# Patient Record
Sex: Female | Born: 1965 | Race: White | Hispanic: No | Marital: Married | Smoking: Former smoker
Health system: Southern US, Community
[De-identification: ages and names within clinical notes are randomized; demographics above are authoritative.]

## PROBLEM LIST (undated history)

## (undated) DIAGNOSIS — E039 Hypothyroidism, unspecified: Secondary | ICD-10-CM

## (undated) DIAGNOSIS — F329 Major depressive disorder, single episode, unspecified: Secondary | ICD-10-CM

## (undated) DIAGNOSIS — K429 Umbilical hernia without obstruction or gangrene: Secondary | ICD-10-CM

## (undated) DIAGNOSIS — E119 Type 2 diabetes mellitus without complications: Secondary | ICD-10-CM

## (undated) DIAGNOSIS — D219 Benign neoplasm of connective and other soft tissue, unspecified: Secondary | ICD-10-CM

## (undated) DIAGNOSIS — H40053 Ocular hypertension, bilateral: Secondary | ICD-10-CM

## (undated) DIAGNOSIS — E282 Polycystic ovarian syndrome: Secondary | ICD-10-CM

## (undated) DIAGNOSIS — H40059 Ocular hypertension, unspecified eye: Secondary | ICD-10-CM

## (undated) DIAGNOSIS — M1990 Unspecified osteoarthritis, unspecified site: Secondary | ICD-10-CM

## (undated) DIAGNOSIS — R32 Unspecified urinary incontinence: Secondary | ICD-10-CM

## (undated) DIAGNOSIS — J45909 Unspecified asthma, uncomplicated: Secondary | ICD-10-CM

## (undated) DIAGNOSIS — F32A Depression, unspecified: Secondary | ICD-10-CM

## (undated) DIAGNOSIS — G629 Polyneuropathy, unspecified: Secondary | ICD-10-CM

## (undated) DIAGNOSIS — G709 Myoneural disorder, unspecified: Secondary | ICD-10-CM

## (undated) DIAGNOSIS — N879 Dysplasia of cervix uteri, unspecified: Secondary | ICD-10-CM

## (undated) DIAGNOSIS — R011 Cardiac murmur, unspecified: Secondary | ICD-10-CM

## (undated) DIAGNOSIS — E785 Hyperlipidemia, unspecified: Secondary | ICD-10-CM

## (undated) DIAGNOSIS — N979 Female infertility, unspecified: Secondary | ICD-10-CM

## (undated) DIAGNOSIS — I10 Essential (primary) hypertension: Secondary | ICD-10-CM

## (undated) DIAGNOSIS — E049 Nontoxic goiter, unspecified: Secondary | ICD-10-CM

## (undated) DIAGNOSIS — J302 Other seasonal allergic rhinitis: Secondary | ICD-10-CM

## (undated) DIAGNOSIS — C720 Malignant neoplasm of spinal cord: Secondary | ICD-10-CM

## (undated) HISTORY — DX: Nontoxic goiter, unspecified: E04.9

## (undated) HISTORY — DX: Ocular hypertension, bilateral: H40.053

## (undated) HISTORY — DX: Type 2 diabetes mellitus without complications: E11.9

## (undated) HISTORY — PX: COLPOSCOPY: SHX161

## (undated) HISTORY — DX: Ocular hypertension, unspecified eye: H40.059

## (undated) HISTORY — DX: Hypothyroidism, unspecified: E03.9

## (undated) HISTORY — DX: Umbilical hernia without obstruction or gangrene: K42.9

## (undated) HISTORY — DX: Unspecified asthma, uncomplicated: J45.909

## (undated) HISTORY — DX: Major depressive disorder, single episode, unspecified: F32.9

## (undated) HISTORY — DX: Unspecified urinary incontinence: R32

## (undated) HISTORY — DX: Unspecified osteoarthritis, unspecified site: M19.90

## (undated) HISTORY — DX: Cardiac murmur, unspecified: R01.1

## (undated) HISTORY — DX: Polycystic ovarian syndrome: E28.2

## (undated) HISTORY — DX: Polyneuropathy, unspecified: G62.9

## (undated) HISTORY — PX: BREAST SURGERY: SHX581

## (undated) HISTORY — PX: BACK SURGERY: SHX140

## (undated) HISTORY — DX: Depression, unspecified: F32.A

## (undated) HISTORY — DX: Dysplasia of cervix uteri, unspecified: N87.9

## (undated) HISTORY — DX: Hyperlipidemia, unspecified: E78.5

## (undated) HISTORY — DX: Malignant neoplasm of spinal cord: C72.0

## (undated) HISTORY — DX: Benign neoplasm of connective and other soft tissue, unspecified: D21.9

## (undated) HISTORY — PX: WISDOM TOOTH EXTRACTION: SHX21

---

## 1997-06-11 DIAGNOSIS — C720 Malignant neoplasm of spinal cord: Secondary | ICD-10-CM

## 1997-06-11 HISTORY — DX: Malignant neoplasm of spinal cord: C72.0

## 1997-12-16 ENCOUNTER — Ambulatory Visit (HOSPITAL_COMMUNITY): Admission: RE | Admit: 1997-12-16 | Discharge: 1997-12-16 | Payer: Self-pay | Admitting: *Deleted

## 1997-12-27 ENCOUNTER — Inpatient Hospital Stay (HOSPITAL_COMMUNITY): Admission: RE | Admit: 1997-12-27 | Discharge: 1998-01-04 | Payer: Self-pay | Admitting: *Deleted

## 1998-01-12 ENCOUNTER — Inpatient Hospital Stay (HOSPITAL_COMMUNITY): Admission: AD | Admit: 1998-01-12 | Discharge: 1998-02-07 | Payer: Self-pay | Admitting: Neurosurgery

## 1998-02-25 ENCOUNTER — Encounter: Admission: RE | Admit: 1998-02-25 | Discharge: 1998-02-25 | Payer: Self-pay | Admitting: Internal Medicine

## 1998-08-21 ENCOUNTER — Ambulatory Visit (HOSPITAL_COMMUNITY): Admission: RE | Admit: 1998-08-21 | Discharge: 1998-08-21 | Payer: Self-pay | Admitting: *Deleted

## 1998-09-09 ENCOUNTER — Encounter: Admission: RE | Admit: 1998-09-09 | Discharge: 1998-12-08 | Payer: Self-pay | Admitting: Radiation Oncology

## 1998-09-10 HISTORY — PX: OVARY SURGERY: SHX727

## 1998-09-20 ENCOUNTER — Ambulatory Visit: Admission: RE | Admit: 1998-09-20 | Discharge: 1998-09-20 | Payer: Self-pay | Admitting: Gynecology

## 1998-09-27 ENCOUNTER — Encounter: Payer: Self-pay | Admitting: Gynecology

## 1998-09-27 ENCOUNTER — Inpatient Hospital Stay (HOSPITAL_COMMUNITY): Admission: RE | Admit: 1998-09-27 | Discharge: 1998-09-29 | Payer: Self-pay | Admitting: Gynecology

## 1998-10-11 ENCOUNTER — Ambulatory Visit: Admission: RE | Admit: 1998-10-11 | Discharge: 1998-10-11 | Payer: Self-pay | Admitting: Gynecologic Oncology

## 1998-10-17 ENCOUNTER — Encounter: Payer: Self-pay | Admitting: Radiation Oncology

## 1998-11-09 ENCOUNTER — Ambulatory Visit: Admission: RE | Admit: 1998-11-09 | Discharge: 1998-11-09 | Payer: Self-pay | Admitting: Gynecology

## 1999-04-12 ENCOUNTER — Other Ambulatory Visit: Admission: RE | Admit: 1999-04-12 | Discharge: 1999-04-12 | Payer: Self-pay | Admitting: Obstetrics and Gynecology

## 1999-05-29 ENCOUNTER — Encounter: Payer: Self-pay | Admitting: Obstetrics and Gynecology

## 1999-05-29 ENCOUNTER — Ambulatory Visit (HOSPITAL_COMMUNITY): Admission: RE | Admit: 1999-05-29 | Discharge: 1999-05-29 | Payer: Self-pay | Admitting: Obstetrics and Gynecology

## 1999-06-17 ENCOUNTER — Ambulatory Visit (HOSPITAL_COMMUNITY): Admission: RE | Admit: 1999-06-17 | Discharge: 1999-06-17 | Payer: Self-pay | Admitting: *Deleted

## 2000-02-09 ENCOUNTER — Ambulatory Visit (HOSPITAL_COMMUNITY): Admission: RE | Admit: 2000-02-09 | Discharge: 2000-02-09 | Payer: Self-pay | Admitting: Pediatrics

## 2000-02-09 ENCOUNTER — Encounter: Payer: Self-pay | Admitting: Obstetrics and Gynecology

## 2000-05-22 ENCOUNTER — Other Ambulatory Visit: Admission: RE | Admit: 2000-05-22 | Discharge: 2000-05-22 | Payer: Self-pay | Admitting: Obstetrics and Gynecology

## 2000-07-20 ENCOUNTER — Ambulatory Visit (HOSPITAL_COMMUNITY): Admission: RE | Admit: 2000-07-20 | Discharge: 2000-07-20 | Payer: Self-pay | Admitting: Neurosurgery

## 2000-07-20 ENCOUNTER — Encounter: Payer: Self-pay | Admitting: Neurosurgery

## 2001-04-24 ENCOUNTER — Other Ambulatory Visit: Admission: RE | Admit: 2001-04-24 | Discharge: 2001-04-24 | Payer: Self-pay | Admitting: Obstetrics and Gynecology

## 2002-05-13 ENCOUNTER — Other Ambulatory Visit: Admission: RE | Admit: 2002-05-13 | Discharge: 2002-05-13 | Payer: Self-pay | Admitting: Obstetrics and Gynecology

## 2002-06-11 HISTORY — PX: MYOMECTOMY: SHX85

## 2002-06-11 HISTORY — PX: HYSTEROSCOPY: SHX211

## 2003-05-27 ENCOUNTER — Encounter (INDEPENDENT_AMBULATORY_CARE_PROVIDER_SITE_OTHER): Payer: Self-pay | Admitting: *Deleted

## 2003-05-27 ENCOUNTER — Inpatient Hospital Stay (HOSPITAL_COMMUNITY): Admission: RE | Admit: 2003-05-27 | Discharge: 2003-05-29 | Payer: Self-pay | Admitting: Obstetrics and Gynecology

## 2003-06-10 ENCOUNTER — Other Ambulatory Visit: Admission: RE | Admit: 2003-06-10 | Discharge: 2003-06-10 | Payer: Self-pay | Admitting: Obstetrics and Gynecology

## 2003-11-23 ENCOUNTER — Ambulatory Visit (HOSPITAL_COMMUNITY): Admission: RE | Admit: 2003-11-23 | Discharge: 2003-11-23 | Payer: Self-pay | Admitting: Obstetrics and Gynecology

## 2004-08-07 ENCOUNTER — Other Ambulatory Visit: Admission: RE | Admit: 2004-08-07 | Discharge: 2004-08-07 | Payer: Self-pay | Admitting: Obstetrics and Gynecology

## 2004-12-04 ENCOUNTER — Ambulatory Visit (HOSPITAL_COMMUNITY): Admission: RE | Admit: 2004-12-04 | Discharge: 2004-12-04 | Payer: Self-pay | Admitting: Obstetrics and Gynecology

## 2004-12-14 ENCOUNTER — Encounter: Admission: RE | Admit: 2004-12-14 | Discharge: 2004-12-14 | Payer: Self-pay | Admitting: Obstetrics and Gynecology

## 2004-12-14 ENCOUNTER — Encounter (INDEPENDENT_AMBULATORY_CARE_PROVIDER_SITE_OTHER): Payer: Self-pay | Admitting: *Deleted

## 2004-12-14 HISTORY — PX: BREAST BIOPSY: SHX20

## 2005-03-20 ENCOUNTER — Ambulatory Visit: Payer: Self-pay | Admitting: Internal Medicine

## 2005-08-20 ENCOUNTER — Other Ambulatory Visit: Admission: RE | Admit: 2005-08-20 | Discharge: 2005-08-20 | Payer: Self-pay | Admitting: Obstetrics and Gynecology

## 2005-09-18 ENCOUNTER — Encounter: Admission: RE | Admit: 2005-09-18 | Discharge: 2005-09-18 | Payer: Self-pay | Admitting: Obstetrics and Gynecology

## 2005-10-15 ENCOUNTER — Ambulatory Visit: Payer: Self-pay | Admitting: Internal Medicine

## 2005-12-05 ENCOUNTER — Ambulatory Visit (HOSPITAL_COMMUNITY): Admission: RE | Admit: 2005-12-05 | Discharge: 2005-12-05 | Payer: Self-pay | Admitting: Obstetrics and Gynecology

## 2006-04-29 ENCOUNTER — Ambulatory Visit: Admission: RE | Admit: 2006-04-29 | Discharge: 2006-04-29 | Payer: Self-pay | Admitting: Obstetrics and Gynecology

## 2006-04-29 ENCOUNTER — Encounter: Payer: Self-pay | Admitting: Vascular Surgery

## 2006-08-22 ENCOUNTER — Other Ambulatory Visit: Admission: RE | Admit: 2006-08-22 | Discharge: 2006-08-22 | Payer: Self-pay | Admitting: Obstetrics and Gynecology

## 2006-12-24 ENCOUNTER — Ambulatory Visit (HOSPITAL_COMMUNITY): Admission: RE | Admit: 2006-12-24 | Discharge: 2006-12-24 | Payer: Self-pay | Admitting: Obstetrics and Gynecology

## 2007-04-02 ENCOUNTER — Ambulatory Visit (HOSPITAL_COMMUNITY): Admission: RE | Admit: 2007-04-02 | Discharge: 2007-04-02 | Payer: Self-pay | Admitting: Gynecology

## 2007-05-03 ENCOUNTER — Telehealth: Payer: Self-pay | Admitting: Internal Medicine

## 2007-09-04 ENCOUNTER — Other Ambulatory Visit: Admission: RE | Admit: 2007-09-04 | Discharge: 2007-09-04 | Payer: Self-pay | Admitting: Obstetrics and Gynecology

## 2008-02-27 ENCOUNTER — Ambulatory Visit (HOSPITAL_COMMUNITY): Admission: RE | Admit: 2008-02-27 | Discharge: 2008-02-27 | Payer: Self-pay | Admitting: Obstetrics and Gynecology

## 2008-06-11 HISTORY — PX: DILATION AND CURETTAGE OF UTERUS: SHX78

## 2008-06-16 ENCOUNTER — Ambulatory Visit: Payer: Self-pay | Admitting: Obstetrics and Gynecology

## 2008-06-17 ENCOUNTER — Ambulatory Visit: Payer: Self-pay | Admitting: Obstetrics and Gynecology

## 2008-06-30 ENCOUNTER — Ambulatory Visit: Payer: Self-pay | Admitting: Obstetrics and Gynecology

## 2008-07-12 ENCOUNTER — Ambulatory Visit: Payer: Self-pay | Admitting: Obstetrics and Gynecology

## 2008-07-16 ENCOUNTER — Encounter: Payer: Self-pay | Admitting: Obstetrics and Gynecology

## 2008-07-16 ENCOUNTER — Ambulatory Visit (HOSPITAL_BASED_OUTPATIENT_CLINIC_OR_DEPARTMENT_OTHER): Admission: RE | Admit: 2008-07-16 | Discharge: 2008-07-16 | Payer: Self-pay | Admitting: Obstetrics and Gynecology

## 2008-07-16 ENCOUNTER — Ambulatory Visit: Payer: Self-pay | Admitting: Obstetrics and Gynecology

## 2008-07-22 ENCOUNTER — Ambulatory Visit: Payer: Self-pay | Admitting: Obstetrics and Gynecology

## 2008-09-07 ENCOUNTER — Other Ambulatory Visit: Admission: RE | Admit: 2008-09-07 | Discharge: 2008-09-07 | Payer: Self-pay | Admitting: Obstetrics and Gynecology

## 2008-09-07 ENCOUNTER — Ambulatory Visit: Payer: Self-pay | Admitting: Obstetrics and Gynecology

## 2008-09-07 ENCOUNTER — Encounter: Payer: Self-pay | Admitting: Obstetrics and Gynecology

## 2008-09-16 ENCOUNTER — Ambulatory Visit: Payer: Self-pay | Admitting: Obstetrics and Gynecology

## 2009-01-27 ENCOUNTER — Ambulatory Visit: Payer: Self-pay | Admitting: Obstetrics and Gynecology

## 2009-02-28 ENCOUNTER — Ambulatory Visit (HOSPITAL_COMMUNITY): Admission: RE | Admit: 2009-02-28 | Discharge: 2009-02-28 | Payer: Self-pay | Admitting: Obstetrics and Gynecology

## 2009-05-20 ENCOUNTER — Ambulatory Visit: Payer: Self-pay | Admitting: Obstetrics and Gynecology

## 2009-09-28 ENCOUNTER — Ambulatory Visit: Payer: Self-pay | Admitting: Obstetrics and Gynecology

## 2009-09-28 ENCOUNTER — Other Ambulatory Visit: Admission: RE | Admit: 2009-09-28 | Discharge: 2009-09-28 | Payer: Self-pay | Admitting: Obstetrics and Gynecology

## 2010-02-24 ENCOUNTER — Ambulatory Visit: Payer: Self-pay | Admitting: Obstetrics and Gynecology

## 2010-03-03 ENCOUNTER — Ambulatory Visit (HOSPITAL_COMMUNITY): Admission: RE | Admit: 2010-03-03 | Discharge: 2010-03-03 | Payer: Self-pay | Admitting: Obstetrics and Gynecology

## 2010-10-24 NOTE — Op Note (Signed)
NAME:  Erika [name], Erika [name]                 ACCOUNT NO.:  [id number]   MEDICAL RECORD NO.:  [id number]          PATIENT TYPE:  AMB   LOCATION:  [facility]                         FACILITY:  [facility]   PHYSICIAN:  Daniel L. Gottsegen, M.D.DATE OF BIRTH:  [birth year]   DATE OF PROCEDURE:  07/16/2008  DATE OF DISCHARGE:                               OPERATIVE REPORT   PREOPERATIVE DIAGNOSIS:  Endometrial polyp.   POSTOPERATIVE DIAGNOSIS:  Endometrial polyp.   NAME OF OPERATION:  Hysteroscopy, D and C, excision of endometrial  polyps.   SURGEON:  Daniel L. Eda Paschal, M.D.   ANESTHESIA:  General.   INDICATIONS:  The patient is a 45 year old nulligravida who had  presented to the office with pelvic pain and underwent an ultrasound.  Ultrasound revealed a large endometrial polyp of 1-2 cm coming off the  anterior fundal wall.  She now enters the hospital now for excision of  the above.   FINDINGS:  External is normal.  BUS is normal.  Vaginal is normal.  Cervix is clean.  Uterus is normal size and shape.  Adnexa failed to  reveal masses.  At the time of hysteroscopy, the patient had a very  large endometrial polyp of 1-2 cm coming off the anterior wall of the  fundus, near the top of the fundus.  Endometrial curettings were lush.  Other than the above, there were no other abnormal findings on  hysteroscopy, both in the uterus or the cervix.   PROCEDURE:  After adequate general anesthesia, the patient was placed in  the dorsal lithotomy position, prepped and draped in a sterile manner.  A single-tooth tenaculum was placed in the anterior lip of the cervix.  The cervix was dilated to a #31 Pratt dilator.  A hysteroscopic  resectoscope was utilized using a camera for magnification and 3%  sorbitol to expand the intrauterine cavity.  A 90-degree wire loop was  utilized with appropriate Bovie settings.  The hysteroscopic  resectoscope was introduced.  The polyp was located and was removed  using as  little cautery as possible to prevent any adhesion formation  postoperatively.  Once it was removed, the endometrium was so lush that  it was possible she had other findings that we could not see, so she was  gently curetted in order to be able to see better.  At this point, we  could see the endometrium better.  There were no other abnormal  findings.  The procedure was terminated.  There was no bleeding noted.  The pathology that was sent was endometrial curettings plus polyp.  Fluid deficit was 90 mL.  Blood loss was minimal.  The patient tolerated  the procedure well and left the operating room in satisfactory  condition.      Daniel L. Eda Paschal, M.D.  Electronically Signed     DLG/MEDQ  D:  07/16/2008  T:  07/16/2008  Job:  [id number]

## 2010-10-27 ENCOUNTER — Other Ambulatory Visit: Payer: Self-pay

## 2010-10-27 NOTE — Op Note (Signed)
NAME:  Erika [name], Erika [name]                 ACCOUNT NO.:  [id number]   MEDICAL RECORD NO.:  [id number]                   PATIENT TYPE:  INP   LOCATION:  0002                                 FACILITY:  [facility]   PHYSICIAN:  Daniel L. Eda Paschal, M.D.           DATE OF BIRTH:  1966/04/06   DATE OF PROCEDURE:  05/27/2003  DATE OF DISCHARGE:                                 OPERATIVE REPORT   PREOPERATIVE DIAGNOSES:  1. Menorrhagia with two submucous myomas.  2. Pelvic adhesions.   POSTOPERATIVE DIAGNOSES:  1. Menorrhagia with two submucous myomas.  2. Pelvic adhesions.   OPERATION:  1. Exploratory laparotomy with lysis of pelvic adhesions.  2. Multiple myomectomies.   SURGEON:  Daniel L. Eda Paschal, M.D.   FIRST ASSISTANT:  Juan H. Lily Peer, M.D.   ANESTHESIA:  General.   FINDINGS:  At the time of surgery, the patient had a large 3 cm myoma that  was coming off the anterior fundal wall and went all the way to the  endometrial cavity but did not actually enter the cavity.  It obviously was  in a position in the upper fundal portion.  It was submucosal that it could  have caused her menorrhagia.  In addition, she had a second smaller myoma of  about 1.5 cm that came off the right posterior myometrial wall and was not  quite as deep as the other one.  The patient's right ovary and tube had been  separated from the uterus by Dr. De Blanch previously  preradiation.  They could be identified up at the ileocecal junction.  There  were some adhesions around it, but they appeared to be well-situated, and  therefore there was no reason to move them.  The patient's left fallopian  tube was normal with luxuriant fimbria.  Left ovary was adherent to the back  of the uterus where it had been repositioned prior to radiation.  There was  no evidence of endometriosis.   DESCRIPTION OF PROCEDURE:  After adequate general endotracheal anesthesia,  the patient was placed in the supine  position and prepped and draped in the  usual sterile manner.  A Foley catheter was inserted into the patient's  bladder, and a pediatric Foley was placed in the uterus.  The patient is  LATEX sensitive, so the room was set up so that no latex equipment, gloves,  etc. were used.  Once this had been done, the patient's previous midline  vertical incision was excised because it had spread.  The incision was then  taken down to the fascia which was opened vertically as was the peritoneum.  Subcutaneous bleeders were clamped and Bovied as encountered.  When the  peritoneal cavity was opened, the above findings were noted.  The adhesion  of the ovary to the uterus was lysed on the left so that now she had a nice,  normal free left ovary and tube with a normal fallopian tube with luxuriant  fimbria.  The  uterus was elevated.  A dilute solution of Pitressin was  injected on the anterior surface of the fundus.  A vertical incision was  made into the fundus, taking care to avoid the left fallopian tube.  The  myoma was immediately encountered.  It was cut down all the way until the  myoma could be excised.  At this point, you could see the endometrial cavity  bulging, and it appeared to be only itself thick.  That is how close to the  endometrium that this myoma was.  Dye was introduced and this area  ballooned.  The left fallopian tube was also patent.  The defect in the  myometrium was closed with interrupted 0 Vicryl, and then the serosa was  closed with a running 2-0 Prolene.  Attention was next addressed to the  other myoma on the right side.  The area was also injected with Pitressin to  prevent bleeding, and then the myoma was removed without difficulty.  The  myometrial defect was closed with interrupted 0 Vicryl, and the serosa was  closed with a 0 Prolene.  Careful inspection was made.  There was no  bleeding noted in the entire pelvis.  She now had a mobile and free left  ovary and tube.   It was felt there was no reason to remove the right ovary.  Intercede was placed where the ovary had been adherent and also over the  anterior myomectomy incision.  Two sponge, needle, and instrument counts  were correct.  The peritoneum and fascia were closed with figure-of-eights  of 0 Vicryl.  Subcutaneous tissue was reapproximated with 3-0 plain, and  then a 4-0 Vicryl subcuticular suture was placed along with Steri-Strips to  close the skin.  Estimated blood loss for the entire procedure was 100 mL  with none replaced.  The patient tolerated the procedure well and left the  operating room in satisfactory condition, draining clear urine from her  Foley catheter.                                               Daniel L. Eda Paschal, M.D.    Tonette Bihari  D:  05/27/2003  T:  05/27/2003  Job:  [id number]

## 2010-10-27 NOTE — H&P (Signed)
NAME:  Erika [name], Erika [name]                 ACCOUNT NO.:  [id number]   MEDICAL RECORD NO.:  [id number]                   PATIENT TYPE:  INP   LOCATION:  NA                                   FACILITY:  [facility]   PHYSICIAN:  Daniel L. Eda Paschal, M.D.           DATE OF BIRTH:  [birth year]   DATE OF ADMISSION:  DATE OF DISCHARGE:                                HISTORY & PHYSICAL   DATE OF ADMISSION:  May 27, 2003   CHIEF COMPLAINT:  Menometrorrhagia with submucous myomas.   HISTORY OF PRESENT ILLNESS:  The patient is a 45 year old nulligravida who  started to have severe menometrorrhagia this summer.  Thyroid was checked  which was normal.  The patient was nonresponsive to oral progestins.  Saline  infusion sonohysterogram was done which showed several small submucous  myomas of about 2 cm each which were distorting the cavity but were clearly  not intracavitary.  It is felt that these are the sources of her  menometrorrhagia.  The patient now enters the hospital for myomectomy  because she is hopeful to conceive.  She understands there is some risk of  hysterectomy but obviously is doing poorly with her menometrorrhagia.  Her  history is also significant in that she had an ependymoma.  She had three  neurosurgical procedures to excise it and then ended up having radiation.  Prior to radiation she saw Dr. De Blanch who operated on her and  did bilateral ovarian transpositions on her to preserve ovarian function  with radiation.  She has had a hysterosalpingogram done which showed that  she does have a patent left fallopian tube and nonpatent right tube.  She  may have peritubular and periovarian adhesions on the left and this will be  treated as well as this time.   PAST MEDICAL HISTORY:  1. 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  2. Exploratory laparotomy by Dr. De Blanch with bilateral     ovarian transposition.   PRESENT  MEDICATIONS:  1. Glucophage for polycystic ovarian syndrome.  2. Chromagen for blood loss.   ALLERGIES:  LATEX and SULFA.   FAMILY HISTORY:  Positive for breast cancer, uncle has had lung cancer, and  mother has had hypertension.   SOCIAL HISTORY:  She does not smoke currently but she did smoke from age 5  to 63.  She does not use alcohol.   REVIEW OF SYSTEMS:  HEENT:  Negative.  CARDIAC:  Negative.  RESPIRATORY:  Negative.  GI:  Negative.  GU:  Negative.  NEUROMUSCULAR:  Negative.  ENDOCRINE:  Negative.   PHYSICAL EXAMINATION:  GENERAL:  The patient is a well-developed and well-  nourished female in no acute distress.  VITAL SIGNS:  Blood pressure is 130/70, pulse is 80 and regular,  respirations 16 and nonlabored; she is afebrile.  HEENT:  All within normal limits.  NECK:  Supple. Trachea in the midline.  Thyroid is not enlarged.  LUNGS:  Clear to P&A.  HEART:  No thrills, heaves, or murmurs.  BREASTS:  No masses.  ABDOMEN:  Soft without guarding, rebound, or masses.  PELVIC:  External vaginal is normal.  Cervix is clean.  Pap smear showed no  atypia.  Uterus is slightly enlarged.  Adnexa are palpably normal.  RECTAL:  Negative.   ADMISSION IMPRESSION:  Menometrorrhagia with several submucous myomas.   PLAN:  Exploratory laparotomy with myomectomy.                                               Daniel L. Eda Paschal, M.D.    Erika [name]  D:  05/26/2003  T:  05/26/2003  Job:  [id number]

## 2010-10-27 NOTE — Discharge Summary (Signed)
NAME:  Erika [name], Erika [name]                 ACCOUNT NO.:  [id number]   MEDICAL RECORD NO.:  [id number]                   PATIENT TYPE:  INP   LOCATION:  0443                                 FACILITY:  [facility]   PHYSICIAN:  Daniel L. Eda Paschal, M.D.           DATE OF BIRTH:  1965/09/26   DATE OF ADMISSION:  05/27/2003  DATE OF DISCHARGE:  05/29/2003                                 DISCHARGE SUMMARY   HISTORY OF PRESENT ILLNESS:  The patient is a 45 year old female who  presented to the office with menometrorrhagia with several submucous myomas.   HOSPITAL COURSE:  On the day of admission, she was taken to the operating  room and multiple myomectomies were done.  She also had lysis of pelvic  adhesions.  By the second postoperative day she was voiding well, passing  gas, and was ready for discharge.  She was discharged on Tylox for pain  relief.  She was also given two Diflucan 150 mg tablets to take one today  and one in 72 hours for a yeast infection with vaginal itching.  She will  see me in the office in two weeks.  Final pathology report is not available  at time of dictation.   CONDITION ON DISCHARGE:  Improved.   DISCHARGE DIAGNOSES:  1. Multiple myomas with menorrhagia.  2. Pelvic adhesions.   OPERATION:  Exploratory laparotomy with multiple myomectomies and lysis of  pelvic adhesions.                                               Daniel L. Eda Paschal, M.D.    Tonette Bihari  D:  05/29/2003  T:  05/29/2003  Job:  [id number]

## 2010-11-07 ENCOUNTER — Other Ambulatory Visit (INDEPENDENT_AMBULATORY_CARE_PROVIDER_SITE_OTHER): Payer: BC Managed Care – PPO

## 2010-11-07 DIAGNOSIS — N912 Amenorrhea, unspecified: Secondary | ICD-10-CM

## 2010-11-07 DIAGNOSIS — N926 Irregular menstruation, unspecified: Secondary | ICD-10-CM

## 2010-11-07 DIAGNOSIS — Z13220 Encounter for screening for lipoid disorders: Secondary | ICD-10-CM

## 2010-11-09 ENCOUNTER — Other Ambulatory Visit (HOSPITAL_COMMUNITY)
Admission: RE | Admit: 2010-11-09 | Discharge: 2010-11-09 | Disposition: A | Discharge status: Home / Self Care | Payer: BC Managed Care – PPO | Source: Ambulatory Visit | Attending: Obstetrics and Gynecology | Admitting: Obstetrics and Gynecology

## 2010-11-09 ENCOUNTER — Encounter (INDEPENDENT_AMBULATORY_CARE_PROVIDER_SITE_OTHER): Payer: BC Managed Care – PPO | Admitting: Obstetrics and Gynecology

## 2010-11-09 ENCOUNTER — Other Ambulatory Visit: Payer: Self-pay | Admitting: Obstetrics and Gynecology

## 2010-11-09 DIAGNOSIS — Z01419 Encounter for gynecological examination (general) (routine) without abnormal findings: Secondary | ICD-10-CM

## 2010-11-09 DIAGNOSIS — Z124 Encounter for screening for malignant neoplasm of cervix: Secondary | ICD-10-CM | POA: Insufficient documentation

## 2010-11-09 DIAGNOSIS — R82998 Other abnormal findings in urine: Secondary | ICD-10-CM

## 2010-11-29 ENCOUNTER — Other Ambulatory Visit (INDEPENDENT_AMBULATORY_CARE_PROVIDER_SITE_OTHER): Payer: BC Managed Care – PPO

## 2010-11-29 DIAGNOSIS — N912 Amenorrhea, unspecified: Secondary | ICD-10-CM

## 2010-12-06 ENCOUNTER — Ambulatory Visit (INDEPENDENT_AMBULATORY_CARE_PROVIDER_SITE_OTHER): Payer: BC Managed Care – PPO | Admitting: Obstetrics and Gynecology

## 2010-12-06 ENCOUNTER — Other Ambulatory Visit: Payer: BC Managed Care – PPO

## 2010-12-06 DIAGNOSIS — N912 Amenorrhea, unspecified: Secondary | ICD-10-CM

## 2010-12-06 DIAGNOSIS — D259 Leiomyoma of uterus, unspecified: Secondary | ICD-10-CM

## 2010-12-06 DIAGNOSIS — N83209 Unspecified ovarian cyst, unspecified side: Secondary | ICD-10-CM

## 2011-01-23 ENCOUNTER — Telehealth: Payer: Self-pay

## 2011-01-23 NOTE — Telephone Encounter (Signed)
CALLING WITH UPDATE-SEE YOUR 12-06-10 OV NOTE. HAD A PERIOD THE END OF June 2012 AFTER THE ULTRASOUND HERE. NO PERIOD AT ALL IN July  AND THEN HAD PERIOD 01-17-11 WITHOUT TAKING GENERIC PROVERA. RECEIVED LETTER TO DO 02-2011 ULTRASOUND AND WILL CALL BACK SOON TO SET THIS UP.

## 2011-02-16 ENCOUNTER — Other Ambulatory Visit: Payer: Self-pay | Admitting: Obstetrics and Gynecology

## 2011-02-16 DIAGNOSIS — Z1231 Encounter for screening mammogram for malignant neoplasm of breast: Secondary | ICD-10-CM

## 2011-03-05 ENCOUNTER — Ambulatory Visit (HOSPITAL_COMMUNITY): Payer: BC Managed Care – PPO

## 2011-03-19 ENCOUNTER — Ambulatory Visit (HOSPITAL_COMMUNITY)
Admission: RE | Admit: 2011-03-19 | Discharge: 2011-03-19 | Disposition: A | Discharge status: Home / Self Care | Payer: BC Managed Care – PPO | Source: Ambulatory Visit | Attending: Obstetrics and Gynecology | Admitting: Obstetrics and Gynecology

## 2011-03-19 DIAGNOSIS — Z1231 Encounter for screening mammogram for malignant neoplasm of breast: Secondary | ICD-10-CM | POA: Insufficient documentation

## 2011-05-07 ENCOUNTER — Ambulatory Visit (INDEPENDENT_AMBULATORY_CARE_PROVIDER_SITE_OTHER): Payer: BC Managed Care – PPO

## 2011-05-07 ENCOUNTER — Ambulatory Visit: Payer: BC Managed Care – PPO | Admitting: Obstetrics and Gynecology

## 2011-05-07 ENCOUNTER — Ambulatory Visit: Payer: BC Managed Care – PPO

## 2011-05-07 ENCOUNTER — Other Ambulatory Visit: Payer: Self-pay | Admitting: Obstetrics and Gynecology

## 2011-05-07 ENCOUNTER — Ambulatory Visit (INDEPENDENT_AMBULATORY_CARE_PROVIDER_SITE_OTHER): Payer: BC Managed Care – PPO | Admitting: Obstetrics and Gynecology

## 2011-05-07 DIAGNOSIS — I872 Venous insufficiency (chronic) (peripheral): Secondary | ICD-10-CM

## 2011-05-07 DIAGNOSIS — N7013 Chronic salpingitis and oophoritis: Secondary | ICD-10-CM

## 2011-05-07 DIAGNOSIS — K429 Umbilical hernia without obstruction or gangrene: Secondary | ICD-10-CM

## 2011-05-07 DIAGNOSIS — N7011 Chronic salpingitis: Secondary | ICD-10-CM

## 2011-05-07 DIAGNOSIS — N83209 Unspecified ovarian cyst, unspecified side: Secondary | ICD-10-CM

## 2011-05-07 DIAGNOSIS — N949 Unspecified condition associated with female genital organs and menstrual cycle: Secondary | ICD-10-CM

## 2011-05-07 DIAGNOSIS — N8300 Follicular cyst of ovary, unspecified side: Secondary | ICD-10-CM

## 2011-05-07 NOTE — Progress Notes (Signed)
Patient came to see me today for problem visit. The first problem is that she needed a followup ultrasound due to left ovarian cyst. The cyst was discovered in June of 2012 and was associated with a left hydrosalpinx. In addition she's noticed a bulge in her umbilicus. It's a little bit uncomfortable and she wonders if she doesn't have a hernia. Her third problem is that she continues to get lower extremity pain. It can be associated with discoloration of the skin. She is had to have 2 Doppler studies done to rule out DVT in the past. Both were negative.  Pelvic ultrasound: Patient's uterus is anteverted with small myomas that are unchanged. Her endometrial echo is 10.5 mm and she just started her period today. Her right ovary is normal. On her left ovary the previous cyst is now gone. She has a small 1 cm thick walled cyst with internal echoes that is probably the residual of her corpus luteum. She continues to have a left hydrosalpinx. It is smaller today. Her cul-de-sac is free of fluid.  Abdomen: I believe she has a small umbilical hernia of 1-2 cm. Extremities: Consistent with venous insufficiency and some skin changes of stasis.  Assessment: #1. Resolution of left ovarian cyst #2. Left hydrosalpinx #3. Small umbilical hernia #4. Venous insufficiency  Plan: Reassured about left ovarian cyst. Once again had a discussion about her infertility and her hydrosalpinx. Once again told her the best solution would be doner egg. Suggested she go back and we discussed it with [facility] infertility. Referred her to Dr. Hart Rochester regarding her legs. Referred her to Dr. Daphine Deutscher regarding her umbilical hernia.

## 2011-07-13 ENCOUNTER — Other Ambulatory Visit: Payer: Self-pay | Admitting: Obstetrics and Gynecology

## 2011-07-13 NOTE — Telephone Encounter (Signed)
Dr. Timoteo Expose pt. Chart is on your desk.  Last filled 12-14-10 for #60 with 6 refills.

## 2011-08-09 ENCOUNTER — Ambulatory Visit: Payer: BC Managed Care – PPO

## 2011-08-09 ENCOUNTER — Ambulatory Visit (INDEPENDENT_AMBULATORY_CARE_PROVIDER_SITE_OTHER): Payer: BC Managed Care – PPO | Admitting: Family Medicine

## 2011-08-09 VITALS — BP 142/97 | HR 99 | Temp 98.7°F | Resp 18 | Ht 64.5 in | Wt 225.0 lb

## 2011-08-09 DIAGNOSIS — E282 Polycystic ovarian syndrome: Secondary | ICD-10-CM

## 2011-08-09 DIAGNOSIS — I10 Essential (primary) hypertension: Secondary | ICD-10-CM

## 2011-08-09 DIAGNOSIS — J158 Pneumonia due to other specified bacteria: Secondary | ICD-10-CM

## 2011-08-09 DIAGNOSIS — R05 Cough: Secondary | ICD-10-CM

## 2011-08-09 DIAGNOSIS — R509 Fever, unspecified: Secondary | ICD-10-CM

## 2011-08-09 DIAGNOSIS — J189 Pneumonia, unspecified organism: Secondary | ICD-10-CM

## 2011-08-09 MED ORDER — LEVOFLOXACIN 500 MG PO TABS: 500.0000 mg | ORAL_TABLET | Freq: Every day | ORAL | Status: AC

## 2011-08-09 NOTE — Progress Notes (Signed)
  Patient Name: Erika [name] Date of Birth: 1966-04-04 Medical Record Number: [id number] Gender: female Date of Encounter: 08/09/2011  History of Present Illness:  Erika [name] is a 46 y.o. very pleasant female patient who presents with the following:  One week ago she developed a cough and then a fever up to 103 on Saturday (today is Thursday).  Then [facility] and got tessalon Perles and other OTC medications for presumed flu or viral URI. temperature 99.1 yesterday.  Has not taken any antipyretics today.  She is concerned because she has a history of asthma.  Last night she had a severe cough attack.  She has had some wheezing especially at night.  Throat feels raw, she notes a HA due to cough and her left ear aches a little bit.    Patient Active Problem List  Diagnoses  . Hypertension  . PCOS (polycystic ovarian syndrome)   Past Medical History  Diagnosis Date  . Fibroid    Past Surgical History  Procedure Date  . Ovary surgery 2000    bilateral ovarian transposition  . Dilation and curettage of uterus 2010    endometrial polyp  . Hysteroscopy 2010  . Myomectomy 2004  . Back surgery    History  Substance Use Topics  . Smoking status: Never Smoker   . Smokeless tobacco: Never Used  . Alcohol Use: No   Family History  Problem Relation Age of Onset  . Hypertension Maternal Aunt   . Breast cancer Maternal Aunt    Allergies  Allergen Reactions  . Latex Hives  . Macrobid Rash  . Sulfa Antibiotics Rash    Medication list has been reviewed and updated.  Review of Systems: As per HPI- otherwise negative.  Physical Examination: Filed Vitals:   08/09/11 1557  BP: 142/97  Pulse: 99  Temp: 98.7 F (37.1 C)  TempSrc: Oral  Resp: 18  Height: 5' 4.5" (1.638 m)  Weight: 225 lb (102.059 kg)    Body mass index is 38.02 kg/(m^2).  GEN: WDWN, NAD, Non-toxic, A & O x 3, obese HEENT: Atraumatic, Normocephalic. Neck supple. No masses, No LAD.  TM and oropharynx wnl.   Ears and Nose: No external deformity. CV: RRR, No M/G/R. No JVD. No thrill. No extra heart sounds. PULM: CTA B, no wheezes, crackles, rhonchi. No retractions. No resp. distress. No accessory muscle use. EXTR: No c/c/e Full strength UE NEURO Normal gait.  PSYCH: Normally interactive. Conversant. Not depressed or anxious appearing.  Calm demeanor.   [facility] reading (PRIMARY) by  Dr. Patsy Lager.  RLL/ retrocardiac infiltrate   Assessment and Plan: 1. Cough  DG Chest 2 View  2. Hypertension    3. PCOS (polycystic ovarian syndrome)    4. Pneumonia      Treat with levaquin 500 for 10 days.  Patient (or parent if minor) instructed to return to clinic or call if not better in 3-4 day(s).

## 2011-08-12 ENCOUNTER — Telehealth: Payer: Self-pay

## 2011-08-12 MED ORDER — HYDROCODONE-HOMATROPINE 5-1.5 MG/5ML PO SYRP: 5.0000 mL | ORAL_SOLUTION | Freq: Three times a day (TID) | ORAL | Status: AC | PRN

## 2011-08-12 NOTE — Telephone Encounter (Signed)
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  She is not any better. Please advise

## 2011-08-12 NOTE — Telephone Encounter (Signed)
Called and discussed with patient- she is still having trouble with cough and desires to have a prescription cough syrup.  I sent over an rx for hycodan to her pharmacy.

## 2011-08-12 NOTE — Telephone Encounter (Signed)
Pt states she still has a bad cough and its making her chest hurts.  She is taking the Tessalon and Levaquin.  She also took some Allegra and is no better.  Also she would like to know what to do about work this week and being outside at work.  Please advise.

## 2011-08-14 ENCOUNTER — Telehealth: Payer: Self-pay

## 2011-08-14 NOTE — Telephone Encounter (Signed)
.  [facility]     PT STATES COUGH MEDICINE IS MAKING HER NAUSEOUS,WANTS ALTERNATIVE,  BEST PHONE [phone]

## 2011-08-15 MED ORDER — PROMETHAZINE-DM 6.25-15 MG/5ML PO SYRP: 5.0000 mL | ORAL_SOLUTION | Freq: Four times a day (QID) | ORAL | Status: AC | PRN

## 2011-08-15 NOTE — Telephone Encounter (Signed)
Try promethazine DM

## 2011-08-15 NOTE — Telephone Encounter (Signed)
Spoke with patient, she states the Hycodan cough syrup is too strong.  Patient is only taking half dose, and it still makes her nauseous.  She would like a different cough syrup called in.  She is waking up during the night with coughing spells, causing her to feel SOB.  Please advise.  Pt uses [facility]

## 2011-08-15 NOTE — Telephone Encounter (Signed)
INFORMED PT THAT SHE HAS RX AT PHARMACY TO P/U.

## 2011-08-15 NOTE — Telephone Encounter (Signed)
PATIENT CALLED BACK.  SHE HAD CALLED YESTERDAY FOR [place], BUT SHE DOESN'T CARE WHAT DOCTOR CALLS HER BACK.  SHE WAS DIAGNOSED WITH PNEUMONIA AND PRESCRIBED COUGH MEDICINE, BUT IT ISNT HELPING.  PLEASE CALL HER BACK ABOUT THIS

## 2011-08-21 ENCOUNTER — Telehealth: Payer: Self-pay

## 2011-08-21 NOTE — Telephone Encounter (Signed)
.  [facility]     Pt calling to say she was recently diagnosed with pneumonia,finished all meds, still not feeling well,tired,numbness in both hands,please advise    Best phone [phone]  [facility]

## 2011-08-22 NOTE — Telephone Encounter (Signed)
Spoke with patient and advised RTC.  She plans to come in tomorrow to see Dr. Patsy Lager.

## 2011-08-23 ENCOUNTER — Ambulatory Visit: Payer: BC Managed Care – PPO

## 2011-08-23 ENCOUNTER — Ambulatory Visit (INDEPENDENT_AMBULATORY_CARE_PROVIDER_SITE_OTHER): Payer: BC Managed Care – PPO | Admitting: Family Medicine

## 2011-08-23 VITALS — BP 138/86 | HR 101 | Temp 98.7°F | Resp 16 | Ht 64.58 in | Wt 238.0 lb

## 2011-08-23 DIAGNOSIS — R9389 Abnormal findings on diagnostic imaging of other specified body structures: Secondary | ICD-10-CM

## 2011-08-23 DIAGNOSIS — E282 Polycystic ovarian syndrome: Secondary | ICD-10-CM

## 2011-08-23 DIAGNOSIS — R5381 Other malaise: Secondary | ICD-10-CM

## 2011-08-23 DIAGNOSIS — R918 Other nonspecific abnormal finding of lung field: Secondary | ICD-10-CM

## 2011-08-23 DIAGNOSIS — R5383 Other fatigue: Secondary | ICD-10-CM

## 2011-08-23 DIAGNOSIS — J45909 Unspecified asthma, uncomplicated: Secondary | ICD-10-CM

## 2011-08-23 LAB — POCT CBC
Granulocyte percent: 51.4 %G (ref 37–80)
HCT, POC: 35.7 % — AB (ref 37.7–47.9)
Lymph, poc: 3.1 (ref 0.6–3.4)
MID (cbc): 0.4 (ref 0–0.9)
POC Granulocyte: 3.7 (ref 2–6.9)
POC LYMPH PERCENT: 43 %L (ref 10–50)
RBC: 4.15 M/uL (ref 4.04–5.48)

## 2011-08-23 LAB — POCT URINE PREGNANCY: Preg Test, Ur: NEGATIVE

## 2011-08-23 MED ORDER — MOMETASONE FURO-FORMOTEROL FUM 200-5 MCG/ACT IN AERO: 1.0000 | INHALATION_SPRAY | Freq: Two times a day (BID) | RESPIRATORY_TRACT | Status: DC

## 2011-08-23 NOTE — Progress Notes (Signed)
  Subjective:    Patient ID: Erika [name], female    DOB: June 08, 1966, 46 y.o.   MRN: [id number]  HPI  Patient presents in follow up of respiratory illness.  Patient states she continues to cough mostly at night and feels exhausted. Cough not responding to Ventolin  History of asthma, on Pulmicort in the past  Review of Systems  Constitutional: Positive for fatigue. Negative for fever, chills and appetite change.  HENT: Negative for congestion and rhinorrhea.   Respiratory: Positive for cough. Negative for shortness of breath.   Cardiovascular: Negative for chest pain.       Objective:   Physical Exam  Constitutional: She appears well-developed and well-nourished.  HENT:  Right Ear: Tympanic membrane is retracted.  Left Ear: Tympanic membrane is not retracted.  Nose: Mucosal edema present.  Neck: Neck supple. Thyromegaly: symmetrical thyroid fullness, nodules not palpable.  Cardiovascular: Normal rate, regular rhythm and normal heart sounds.   Pulmonary/Chest: Effort normal and breath sounds normal.  Lymphadenopathy:    She has no cervical adenopathy.  Neurological: She is alert.  Skin: Skin is warm.  Psychiatric: She has a normal mood and affect.   Results for orders placed in visit on 08/23/11  POCT CBC      Component Value Range   WBC 7.2  4.6 - 10.2 (K/uL)   Lymph, poc 3.1  0.6 - 3.4    POC LYMPH PERCENT 43.0  10 - 50 (%L)   MID (cbc) 0.4  0 - 0.9    POC MID % 5.6  0 - 12 (%M)   POC Granulocyte 3.7  2 - 6.9    Granulocyte percent 51.4  37 - 80 (%G)   RBC 4.15  4.04 - 5.48 (M/uL)   Hemoglobin 11.5 (*) 12.2 - 16.2 (g/dL)   HCT, POC 40.9 (*) 81.1 - 47.9 (%)   MCV 86.1  80 - 97 (fL)   MCH, POC 27.7  27 - 31.2 (pg)   MCHC 32.2  31.8 - 35.4 (g/dL)   RDW, POC 91.4     Platelet Count, POC 220  142 - 424 (K/uL)   MPV 8.2  0 - 99.8 (fL)  POCT URINE PREGNANCY      Component Value Range   Preg Test, Ur Negative      [facility] reading (PRIMARY) by  Dr. Hal Hope. No  infiltrate     Assessment & Plan:   1. Fatigue  POCT CBC, TSH  2. RAD (reactive airway disease)  Peak flow, Peak flow, Mometasone Furo-Formoterol Fum 200-5 MCG/ACT AERO  3. Abnormal CXR  DG Chest 1 View, POCT urine pregnancy  4. PCOS (polycystic ovarian syndrome)     No am activity(given cold temperature) for 7 days Fax labs to Dr. Eda Paschal

## 2011-08-24 LAB — TSH: TSH: 1.05 u[IU]/mL (ref 0.350–4.500)

## 2011-08-26 ENCOUNTER — Telehealth: Payer: Self-pay

## 2011-08-26 NOTE — Telephone Encounter (Signed)
Please review labs/meds and advise.

## 2011-08-26 NOTE — Telephone Encounter (Signed)
Pt is calling about lab results on her thyroid and would like to let Dr Hal Hope know that the inhaler she gave her is not working and would like to know if she would call in some  predizone called in

## 2011-08-27 NOTE — Telephone Encounter (Signed)
I agree with sarah's assessment- I treated her for pneumonia a couple of weeks ago, but if she is not better I would need to see her before I could rx steroids.

## 2011-08-27 NOTE — Telephone Encounter (Signed)
Tell pt her TSH was normal.  If she is not responding to her current inhaled steroid she needs OV for assessment and treatment, cannot call in prednisone (Dr. Hal Hope not here all week).

## 2011-08-28 NOTE — Telephone Encounter (Signed)
Explained message from Dr Patsy Lager. Pt will RTC Thurs or Sun to see Dr Patsy Lager if she doesn't improve. She used hycodan last night and it helped her sleep better. Pt requests Rx for Tessalon Perles to use during the day bc she is trying to work and needs to stay awake.

## 2011-08-28 NOTE — Telephone Encounter (Signed)
She may certainly have tessalon perles- 100mg  1 or 2 TID prn, #40.  Thanks!

## 2011-08-28 NOTE — Telephone Encounter (Signed)
Called in Rx to [facility] and notified pt

## 2011-10-17 ENCOUNTER — Ambulatory Visit (INDEPENDENT_AMBULATORY_CARE_PROVIDER_SITE_OTHER): Payer: BC Managed Care – PPO | Admitting: Internal Medicine

## 2011-10-17 VITALS — BP 138/86 | HR 102 | Temp 98.5°F | Resp 18 | Ht 65.5 in | Wt 238.0 lb

## 2011-10-17 DIAGNOSIS — J209 Acute bronchitis, unspecified: Secondary | ICD-10-CM

## 2011-10-17 DIAGNOSIS — E119 Type 2 diabetes mellitus without complications: Secondary | ICD-10-CM

## 2011-10-17 DIAGNOSIS — N912 Amenorrhea, unspecified: Secondary | ICD-10-CM

## 2011-10-17 LAB — POCT CBC
Granulocyte percent: 65.8 %G (ref 37–80)
HCT, POC: 39.5 % (ref 37.7–47.9)
MCH, POC: 27.9 pg (ref 27–31.2)
MCV: 86.7 fL (ref 80–97)
MID (cbc): 0.5 (ref 0–0.9)
POC LYMPH PERCENT: 27.5 %L (ref 10–50)
RDW, POC: 14.8 %
WBC: 7.7 10*3/uL (ref 4.6–10.2)

## 2011-10-17 LAB — POCT URINE PREGNANCY: Preg Test, Ur: NEGATIVE

## 2011-10-17 LAB — POCT GLYCOSYLATED HEMOGLOBIN (HGB A1C): Hemoglobin A1C: 10.2

## 2011-10-17 MED ORDER — AZITHROMYCIN 500 MG PO TABS: 500.0000 mg | ORAL_TABLET | Freq: Every day | ORAL | Status: AC

## 2011-10-17 MED ORDER — BENZONATATE 100 MG PO CAPS: 100.0000 mg | ORAL_CAPSULE | Freq: Two times a day (BID) | ORAL | Status: AC | PRN

## 2011-10-17 NOTE — Patient Instructions (Addendum)
Diabetic diet. Take metformin as directed 1000 mg bid. Zithromax for bronchitis and tessalon perle 1 tab every 8 hours for cough. increase fluids. follow up as directed

## 2011-10-17 NOTE — Progress Notes (Signed)
Subjective:    Patient ID: Erika [name], female    DOB: 1966-05-06, 46 y.o.   MRN: [id number]  HPI amenorrhea for 3 month Is trying to become pregnant Is on metformin for pod and also is being followed by gyn Has had other times of several months of amenorrhea dn is being tested for early menopause by her gyn has a gyn appointment in 3 weeks. Also c/o dry mouth cracks at the corners of her mouth feels worsewhen she is using her steroid inhaler and also se is coughing for one week. 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   HENT: Negative.   Eyes: Negative.   Respiratory: Positive for cough.   Cardiovascular: Negative.   Gastrointestinal: Negative.   Genitourinary: Positive for menstrual problem.  Musculoskeletal: Negative.   Skin: Negative.   Neurological: Negative.   Hematological: Negative.   Psychiatric/Behavioral: Negative.   All other systems reviewed and are negative.       Objective:   Physical Exam  Nursing note and vitals reviewed. Constitutional: She is oriented to person, place, and time. She appears well-developed and well-nourished.  HENT:  Head: Normocephalic and atraumatic.  Right Ear: External ear normal.  Left Ear: External ear normal.  Eyes: Conjunctivae and EOM are normal. Pupils are equal, round, and reactive to light.  Neck: Normal range of motion. Neck supple.  Cardiovascular: Normal rate, regular rhythm, normal heart sounds and intact distal pulses.   Pulmonary/Chest: Effort normal.  Abdominal: Soft. Bowel sounds are normal.  Musculoskeletal: Normal range of motion.  Neurological: She is alert and oriented to person, place, and time. She has normal reflexes.  Skin: Skin is warm and dry.  Psychiatric: She has a normal mood and affect. Her behavior is normal. Judgment and thought content normal.       Results for orders placed in visit on 10/17/11  POCT URINE PREGNANCY      Component Value Range   Preg Test, Ur Negative     Results for orders placed in visit on 10/17/11  POCT URINE PREGNANCY      Component Value Range   Preg Test, Ur Negative    POCT CBC      Component Value Range   WBC 7.7  4.6 - 10.2 (K/uL)   Lymph, poc 2.1  0.6 - 3.4    POC LYMPH PERCENT 27.5  10 - 50 (%L)   MID (cbc) 0.5  0 - 0.9    POC MID % 6.7  0 - 12 (%M)   POC Granulocyte 5.1  2 - 6.9    Granulocyte percent 65.8  37 - 80 (%G)   RBC 4.56  4.04 - 5.48 (M/uL)   Hemoglobin 12.7  12.2 - 16.2 (g/dL)   HCT, POC 40.9  81.1 - 47.9 (%)   MCV 86.7  80 - 97 (fL)   MCH, POC 27.9  27 - 31.2 (pg)   MCHC 32.2  31.8 - 35.4 (g/dL)   RDW, POC 91.4     Platelet Count, POC 218  142 - 424 (K/uL)   MPV 8.2  0 - 99.8 (fL)  GLUCOSE, POCT (MANUAL RESULT ENTRY)      Component Value Range   POC Glucose 205        Results for orders placed in visit on 10/17/11  POCT URINE PREGNANCY  Component Value Range   Preg Test, Ur Negative    POCT CBC      Component Value Range   WBC 7.7  4.6 - 10.2 (K/uL)   Lymph, poc 2.1  0.6 - 3.4    POC LYMPH PERCENT 27.5  10 - 50 (%L)   MID (cbc) 0.5  0 - 0.9    POC MID % 6.7  0 - 12 (%M)   POC Granulocyte 5.1  2 - 6.9    Granulocyte percent 65.8  37 - 80 (%G)   RBC 4.56  4.04 - 5.48 (M/uL)   Hemoglobin 12.7  12.2 - 16.2 (g/dL)   HCT, POC 16.1  09.6 - 47.9 (%)   MCV 86.7  80 - 97 (fL)   MCH, POC 27.9  27 - 31.2 (pg)   MCHC 32.2  31.8 - 35.4 (g/dL)   RDW, POC 04.5     Platelet Count, POC 218  142 - 424 (K/uL)   MPV 8.2  0 - 99.8 (fL)  GLUCOSE, POCT (MANUAL RESULT ENTRY)      Component Value Range   POC Glucose 205     Assessment & Plan:  Glucose is elevatd. hgb a1c is 10.2 Pt is taking metformin bid instead of tid. Will educate and also rx for acute bronchitus.will increase metformin to 1000 mg bid.

## 2011-10-18 ENCOUNTER — Telehealth: Payer: Self-pay | Admitting: *Deleted

## 2011-10-18 MED ORDER — MEDROXYPROGESTERONE ACETATE 10 MG PO TABS: 10.0000 mg | ORAL_TABLET | Freq: Every day | ORAL | Status: DC

## 2011-10-18 NOTE — Telephone Encounter (Signed)
Provera 10 mg daily for 5 days.

## 2011-10-18 NOTE — Telephone Encounter (Signed)
Pt calling c/o no period, LMP:2/12, pt was seen at PCP office 5/8 for bronchitis and neg. Preg. Test.  Pt is calling requesting provera to help start period. Please advise

## 2011-10-18 NOTE — Telephone Encounter (Signed)
Addended by: Aura Camps on: 10/18/2011 10:05 AM   Modules accepted: Orders

## 2011-10-18 NOTE — Telephone Encounter (Signed)
Pt informed with the below note, rx sent. 

## 2011-11-14 ENCOUNTER — Encounter: Payer: Self-pay | Admitting: Obstetrics and Gynecology

## 2011-11-14 ENCOUNTER — Ambulatory Visit (INDEPENDENT_AMBULATORY_CARE_PROVIDER_SITE_OTHER): Payer: BC Managed Care – PPO | Admitting: Obstetrics and Gynecology

## 2011-11-14 VITALS — BP 124/78 | Ht 65.0 in | Wt 234.0 lb

## 2011-11-14 DIAGNOSIS — Z01419 Encounter for gynecological examination (general) (routine) without abnormal findings: Secondary | ICD-10-CM

## 2011-11-14 MED ORDER — HYDROCORTISONE ACETATE 25 MG RE SUPP: 25.0000 mg | Freq: Two times a day (BID) | RECTAL | Status: AC

## 2011-11-14 NOTE — Progress Notes (Addendum)
Patient came to see me today for her annual GYN exam. She missed her last cycle and we gave her Provera and she had a normal cycle.I checked her [facility] year ago and was 12. She is having a lot of night sweats. She is having no abnormal bleeding. She is having no pelvic pain. 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 Her last mammogram was in September and was normal. She thinks her umbilical hernia is larger. She so far has not seen Dr. Daphine Deutscher. She has a very elevated hemoglobin A1c of greater than 10. We had her on metformin for PCO S. And her PCP asked her to double the dose but she has not done so yet. She had cervical dysplasia greater 20 years ago and has had normal Pap smears since. It appears that was observed rather  them treated. She is aware of her hemorrhoids and they are uncomfortable. She does not bleed with them but wanted medication.  Physical examination: Kennon Portela present. HEENT within normal limits. Neck: Thyroid not large. No masses. Supraclavicular nodes: not enlarged. Breasts: Examined in both sitting and lying  position. No skin changes and no masses. Abdomen: Soft no guarding rebound. 3 cm umbilical hernia Pelvic: External: Within normal limits. BUS: Within normal limits. Vaginal:within normal limits. Good estrogen effect. No evidence of cystocele rectocele or enterocele. Cervix: clean. Uterus: Normal size and shape. Adnexa: No masses. Rectovaginal exam: Confirmatory and negative. Extremities: Within normal limits.  Assessment: #1. Oligo amenorrhea #2. Night sweats #3. Elevated hemoglobin A1c #4. Umbilical hernia #5. Symptomatic hemorrhoids  Plan: Referred her to endocrinologist. Continue yearly mammograms. Follow up ultrasound in November 2013. Discussed low-dose birth control pills for control of her hot flashes. She will inform. If she wants them we will try Loestrin 1/20. If she does not she will call for Provera withdrawal when necessary. Refer to surgeon.  Hydrocortisone acetate suppositories for hemorrhoids.

## 2011-11-15 LAB — URINALYSIS W MICROSCOPIC + REFLEX CULTURE
Bacteria, UA: NONE SEEN
Bilirubin Urine: NEGATIVE
Casts: NONE SEEN
Glucose, UA: NEGATIVE mg/dL
Hgb urine dipstick: NEGATIVE
Leukocytes, UA: NEGATIVE
Nitrite: NEGATIVE
Protein, ur: NEGATIVE mg/dL
Specific Gravity, Urine: 1.027 (ref 1.005–1.030)
Squamous Epithelial / HPF: NONE SEEN
Urobilinogen, UA: 0.2 mg/dL (ref 0.0–1.0)
pH: 6 (ref 5.0–8.0)

## 2011-11-21 ENCOUNTER — Telehealth: Payer: Self-pay | Admitting: *Deleted

## 2011-11-21 DIAGNOSIS — N390 Urinary tract infection, site not specified: Secondary | ICD-10-CM

## 2011-11-21 MED ORDER — CIPROFLOXACIN HCL 500 MG PO TABS: 500.0000 mg | ORAL_TABLET | Freq: Two times a day (BID) | ORAL | Status: AC

## 2011-11-21 NOTE — Addendum Note (Signed)
Addended by: Aura Camps on: 11/21/2011 10:11 AM   Modules accepted: Orders

## 2011-11-21 NOTE — Telephone Encounter (Signed)
Pt informed with the below note, rx sent, order placed for u/a repeat.

## 2011-11-21 NOTE — Telephone Encounter (Signed)
Cipro 500 mg twice a day for 7 days and return for urinalysis after treatment.

## 2011-11-21 NOTE — Telephone Encounter (Signed)
Pt calling c/o possible UTI that started this past weekend, urgency, frequent urination, vaginal itching, burning w/wo urination and cloudy urine.  She is requesting Rx for Cipro, given this before and worked great. Pt was last seen 11/14/11 Please advise

## 2011-12-02 ENCOUNTER — Other Ambulatory Visit: Payer: Self-pay | Admitting: Obstetrics and Gynecology

## 2011-12-05 ENCOUNTER — Telehealth: Payer: Self-pay | Admitting: *Deleted

## 2011-12-05 NOTE — Telephone Encounter (Signed)
Pt given name of dr. Talmage Nap office endo. Per Dr.G he wanted pt to endo.

## 2012-01-03 ENCOUNTER — Telehealth: Payer: Self-pay | Admitting: *Deleted

## 2012-01-03 NOTE — Telephone Encounter (Signed)
(  Dr.G patient) Pt calling c/o no cycle since 10/29/11 LMP:10/29/11 she did have spotting in June 2 twice and also had spotting after sex in June as well. Pt said 10/29/11 cycle started only due to provera at OV 11/14/11 She has not taken UPT yet. I asked pt to take one, she will do and call back tomorrow with results. Please advise

## 2012-01-03 NOTE — Telephone Encounter (Signed)
Telephone call, will check a home urine pregnancy test, had 2 days of light spotting in June none in July. Has sent for records to be sent to Dr. Talmage Nap, has not been scheduled for appointment yet. Hemoglobin A1c was 10.11 Oct 2011.

## 2012-01-04 ENCOUNTER — Other Ambulatory Visit: Payer: Self-pay | Admitting: Women's Health

## 2012-01-04 DIAGNOSIS — N912 Amenorrhea, unspecified: Secondary | ICD-10-CM

## 2012-01-04 MED ORDER — MEDROXYPROGESTERONE ACETATE 10 MG PO TABS: 10.0000 mg | ORAL_TABLET | Freq: Every day | ORAL | Status: DC

## 2012-01-04 NOTE — Telephone Encounter (Signed)
Telephone call, home U PT negative. States felt better after last cycle. Will withdraw with Provera 10mg  for 5 days and instructed to call if no cycle after 2 weeks from completing. Also reviewed per her request that she has had normal blood sugars in the past 96 in 2011, 85 in 2009. Reviewed elevated blood sugars are a recent problem and needs to keep scheduled appointment with Dr. Talmage Nap.

## 2012-01-04 NOTE — Telephone Encounter (Signed)
Pt took UPT and it was negative, should pt wait until Dr.G returns to follow up? Please advise

## 2012-01-04 NOTE — Telephone Encounter (Signed)
Please see below note

## 2012-02-12 ENCOUNTER — Other Ambulatory Visit: Payer: Self-pay | Admitting: Women's Health

## 2012-03-04 ENCOUNTER — Other Ambulatory Visit: Payer: Self-pay | Admitting: Endocrinology

## 2012-03-04 DIAGNOSIS — E049 Nontoxic goiter, unspecified: Secondary | ICD-10-CM

## 2012-03-07 ENCOUNTER — Ambulatory Visit
Admission: RE | Admit: 2012-03-07 | Discharge: 2012-03-07 | Disposition: A | Discharge status: Home / Self Care | Payer: BC Managed Care – PPO | Source: Ambulatory Visit | Attending: Endocrinology | Admitting: Endocrinology

## 2012-03-07 DIAGNOSIS — E049 Nontoxic goiter, unspecified: Secondary | ICD-10-CM

## 2012-03-11 ENCOUNTER — Other Ambulatory Visit (HOSPITAL_COMMUNITY): Payer: Self-pay | Admitting: Endocrinology

## 2012-03-11 DIAGNOSIS — E049 Nontoxic goiter, unspecified: Secondary | ICD-10-CM

## 2012-03-24 ENCOUNTER — Ambulatory Visit (HOSPITAL_COMMUNITY): Payer: BC Managed Care – PPO

## 2012-03-24 ENCOUNTER — Other Ambulatory Visit (HOSPITAL_COMMUNITY): Payer: BC Managed Care – PPO

## 2012-04-04 ENCOUNTER — Other Ambulatory Visit: Payer: Self-pay | Admitting: Obstetrics and Gynecology

## 2012-04-04 DIAGNOSIS — Z803 Family history of malignant neoplasm of breast: Secondary | ICD-10-CM

## 2012-04-04 DIAGNOSIS — Z1231 Encounter for screening mammogram for malignant neoplasm of breast: Secondary | ICD-10-CM

## 2012-04-07 ENCOUNTER — Encounter (HOSPITAL_COMMUNITY): Payer: BC Managed Care – PPO

## 2012-04-09 ENCOUNTER — Ambulatory Visit (HOSPITAL_COMMUNITY): Payer: BC Managed Care – PPO

## 2012-04-10 ENCOUNTER — Encounter (HOSPITAL_COMMUNITY)
Admission: RE | Admit: 2012-04-10 | Discharge: 2012-04-10 | Disposition: A | Discharge status: Home / Self Care | Payer: BC Managed Care – PPO | Source: Ambulatory Visit | Attending: Endocrinology | Admitting: Endocrinology

## 2012-04-10 DIAGNOSIS — E049 Nontoxic goiter, unspecified: Secondary | ICD-10-CM

## 2012-04-10 MED ORDER — SODIUM PERTECHNETATE TC 99M INJECTION
10.8000 | Freq: Once | INTRAVENOUS | Status: AC | PRN
  Administered 2012-04-10: 10.8 via INTRAVENOUS

## 2012-04-14 ENCOUNTER — Other Ambulatory Visit: Payer: Self-pay | Admitting: Endocrinology

## 2012-04-14 DIAGNOSIS — E042 Nontoxic multinodular goiter: Secondary | ICD-10-CM

## 2012-04-22 ENCOUNTER — Ambulatory Visit (HOSPITAL_COMMUNITY): Payer: BC Managed Care – PPO

## 2012-04-23 ENCOUNTER — Inpatient Hospital Stay
Admission: RE | Admit: 2012-04-23 | Discharge: 2012-04-23 | Payer: BC Managed Care – PPO | Source: Ambulatory Visit | Attending: Endocrinology | Admitting: Endocrinology

## 2012-04-24 ENCOUNTER — Ambulatory Visit
Admission: RE | Admit: 2012-04-24 | Discharge: 2012-04-24 | Disposition: A | Discharge status: Home / Self Care | Payer: BC Managed Care – PPO | Source: Ambulatory Visit | Attending: Endocrinology | Admitting: Endocrinology

## 2012-04-24 ENCOUNTER — Other Ambulatory Visit (HOSPITAL_COMMUNITY)
Admission: RE | Admit: 2012-04-24 | Discharge: 2012-04-24 | Disposition: A | Discharge status: Home / Self Care | Payer: BC Managed Care – PPO | Source: Ambulatory Visit | Attending: Interventional Radiology | Admitting: Interventional Radiology

## 2012-04-24 DIAGNOSIS — E042 Nontoxic multinodular goiter: Secondary | ICD-10-CM

## 2012-04-24 DIAGNOSIS — E049 Nontoxic goiter, unspecified: Secondary | ICD-10-CM | POA: Insufficient documentation

## 2012-04-30 ENCOUNTER — Ambulatory Visit (HOSPITAL_COMMUNITY)
Admission: RE | Admit: 2012-04-30 | Discharge: 2012-04-30 | Disposition: A | Discharge status: Home / Self Care | Payer: BC Managed Care – PPO | Source: Ambulatory Visit | Attending: Obstetrics and Gynecology | Admitting: Obstetrics and Gynecology

## 2012-04-30 DIAGNOSIS — Z1231 Encounter for screening mammogram for malignant neoplasm of breast: Secondary | ICD-10-CM | POA: Insufficient documentation

## 2012-05-14 ENCOUNTER — Other Ambulatory Visit: Payer: Self-pay | Admitting: Obstetrics and Gynecology

## 2012-10-16 ENCOUNTER — Ambulatory Visit (INDEPENDENT_AMBULATORY_CARE_PROVIDER_SITE_OTHER): Payer: BC Managed Care – PPO | Admitting: Gynecology

## 2012-10-16 ENCOUNTER — Encounter: Payer: Self-pay | Admitting: Gynecology

## 2012-10-16 DIAGNOSIS — K429 Umbilical hernia without obstruction or gangrene: Secondary | ICD-10-CM

## 2012-10-16 DIAGNOSIS — R609 Edema, unspecified: Secondary | ICD-10-CM

## 2012-10-16 DIAGNOSIS — N926 Irregular menstruation, unspecified: Secondary | ICD-10-CM

## 2012-10-16 LAB — TSH: TSH: 1 u[IU]/mL (ref 0.350–4.500)

## 2012-10-16 NOTE — Progress Notes (Signed)
Patient presents having not had any menses for 4 months and then started 2 days ago with regular flow. Does have history of irregular menses previously with diagnosis of PCOS. She does note over the past several years that her menses have been more regular occurring monthly.  She currently is bleeding and refuses exam. No significant weight changes hair changes skin changes. He is being actively followed by Dr. Cleon Gustin for diabetes and hypothyroidism with goiter. Also is on Dyazide per Dr. Eda Paschal for "swelling".  Exam HEENT grossly normal Spine straight without CVA tenderness. Abdomen soft nontender without masses guarding rebound organomegaly. Does have an umbilical hernia that does easily reduce. Pelvic refused because of her bleeding. Extremities with bilateral pretibial swelling, left greater than right. There is significant tenderness or redness  Assessment and plan. 1. Irregular menses. Regular menses followed by 4 months of amenorrhea now with onset of menses. I reviewed with patient in the absence of exam I am discussing in general my thoughts. Does have history of PCOS. No overt history to suggest menopause such as hot flushes night sweats. We'll check baseline labs to include FSH TSH prolactin and hCG is she is not contraception following a long history of infertility. I did review this with her and she said that she would accept pregnancy it occurred and does not desire contraception.  We will keep a menstrual calendar if she resumes regular monthly menses that will follow. She's actually due for an annual in June and we will go ahead and examine her at that time. If irregular bleeding continues she'll need to re\re presented in the exam and she clearly understands this. Again she understands my evaluation today is somewhat limited in the absence of a pelvic exam. 2. Peripheral swelling. Patient has a long history of swelling for which Dr. Eda Paschal had her on Dyazide. Her left leg would  swell more than right in fact had ultrasound studies to rule out DVT in the past. This tends to be a chronic problem. I reviewed with her that I am uncomfortable given her medical history of diabetes hypothyroidism with treating her with a diuretic without any diagnoses. The recommendation was to followup with her primary physician or Dr. Cleon Gustin for further evaluation and management of her diuretic. If they feel she needs to continue this for whatever reason they can't provide it. I do feel though that she needs to have evaluation as far as renal/cardiac etiologies. I ordered a comprehensive metabolic panel today to check electrolyte renal and liver status. 3. Umbilical hernia. I reviewed her umbilical hernia. She's had this for a long time. I discussed the issues of surgical repair versus observation. The risks of incarceration with acute abdomen reviewed and symptoms discussed. Will refer to Gen. surgery at her discretion she is not ready to proceed with this at this time.

## 2012-10-16 NOTE — Patient Instructions (Signed)
Followup for lab results. Followup for annual exam end of June. Follow up sooner if significant irregular bleeding.  Followup with your primary physician or Dr. Talmage Nap in reference to your peripheral swelling and use of diuretics.

## 2012-10-17 LAB — COMPREHENSIVE METABOLIC PANEL
Albumin: 4.2 g/dL (ref 3.5–5.2)
Alkaline Phosphatase: 87 U/L (ref 39–117)
BUN: 16 mg/dL (ref 6–23)
CO2: 29 mEq/L (ref 19–32)
Glucose, Bld: 115 mg/dL — ABNORMAL HIGH (ref 70–99)
Potassium: 3.5 mEq/L (ref 3.5–5.3)

## 2012-10-17 LAB — FOLLICLE STIMULATING HORMONE: FSH: 31 m[IU]/mL

## 2012-10-17 LAB — HCG, SERUM, QUALITATIVE: Preg, Serum: NEGATIVE

## 2012-11-14 ENCOUNTER — Other Ambulatory Visit: Payer: Self-pay | Admitting: Endocrinology

## 2012-11-14 DIAGNOSIS — E049 Nontoxic goiter, unspecified: Secondary | ICD-10-CM

## 2012-11-19 ENCOUNTER — Ambulatory Visit
Admission: RE | Admit: 2012-11-19 | Discharge: 2012-11-19 | Disposition: A | Discharge status: Home / Self Care | Payer: BC Managed Care – PPO | Source: Ambulatory Visit | Attending: Endocrinology | Admitting: Endocrinology

## 2012-11-19 DIAGNOSIS — E049 Nontoxic goiter, unspecified: Secondary | ICD-10-CM

## 2012-12-05 ENCOUNTER — Encounter: Payer: BC Managed Care – PPO | Admitting: Gynecology

## 2012-12-16 IMAGING — CR DG CHEST 1V
3 series · 3 of 3 positions shown · non-contrast
Comparison: Chest x-ray of 08/09/2011

CLINICAL DATA: Questionable abnormality on prior chest x-ray

CHEST - 1 VIEW

[PA]
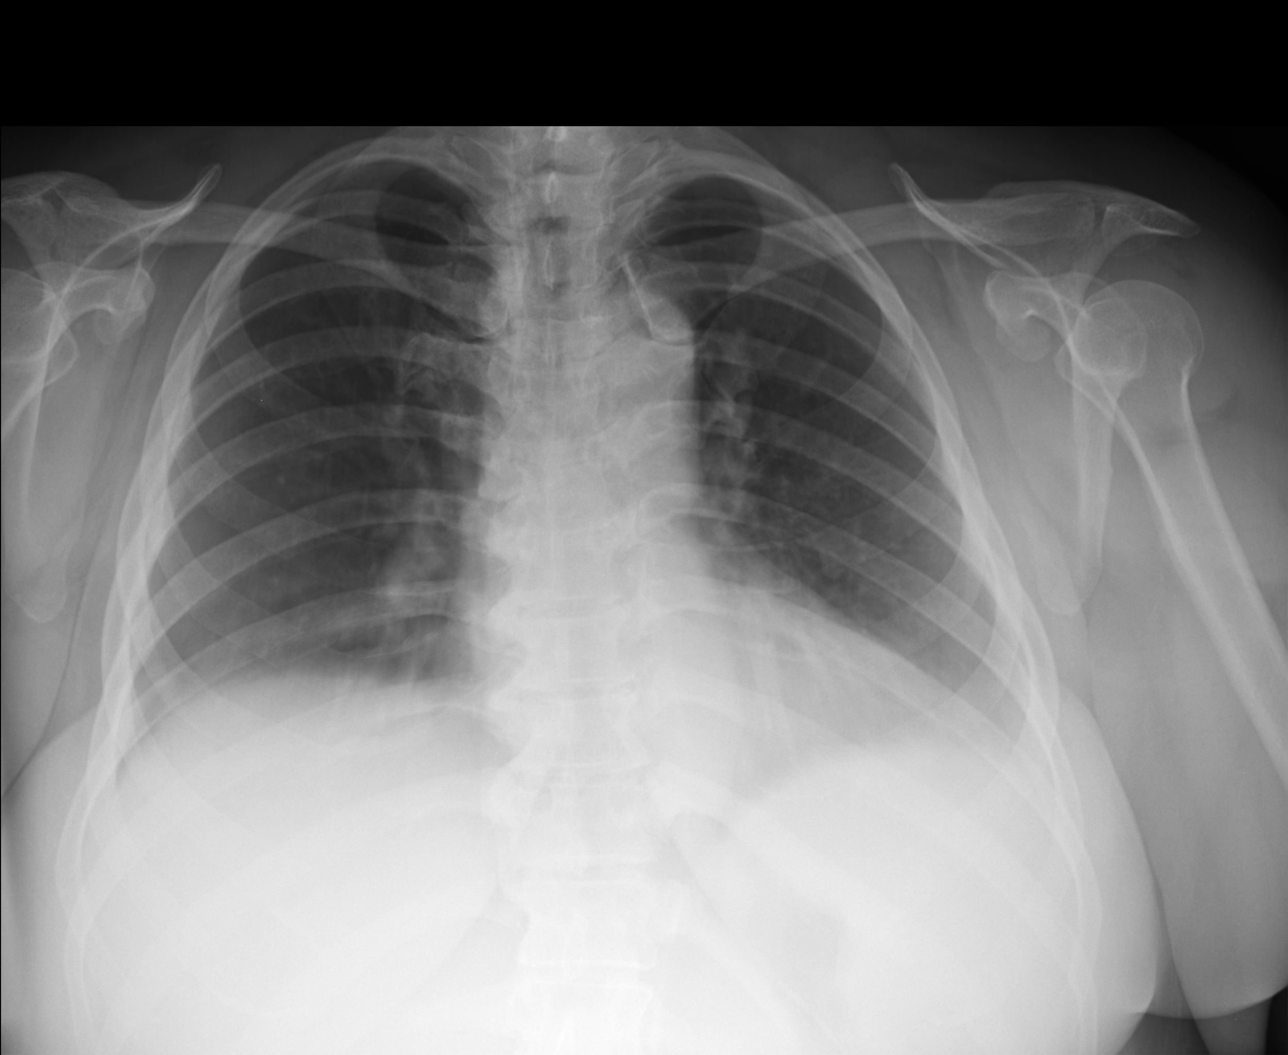

[lateral]
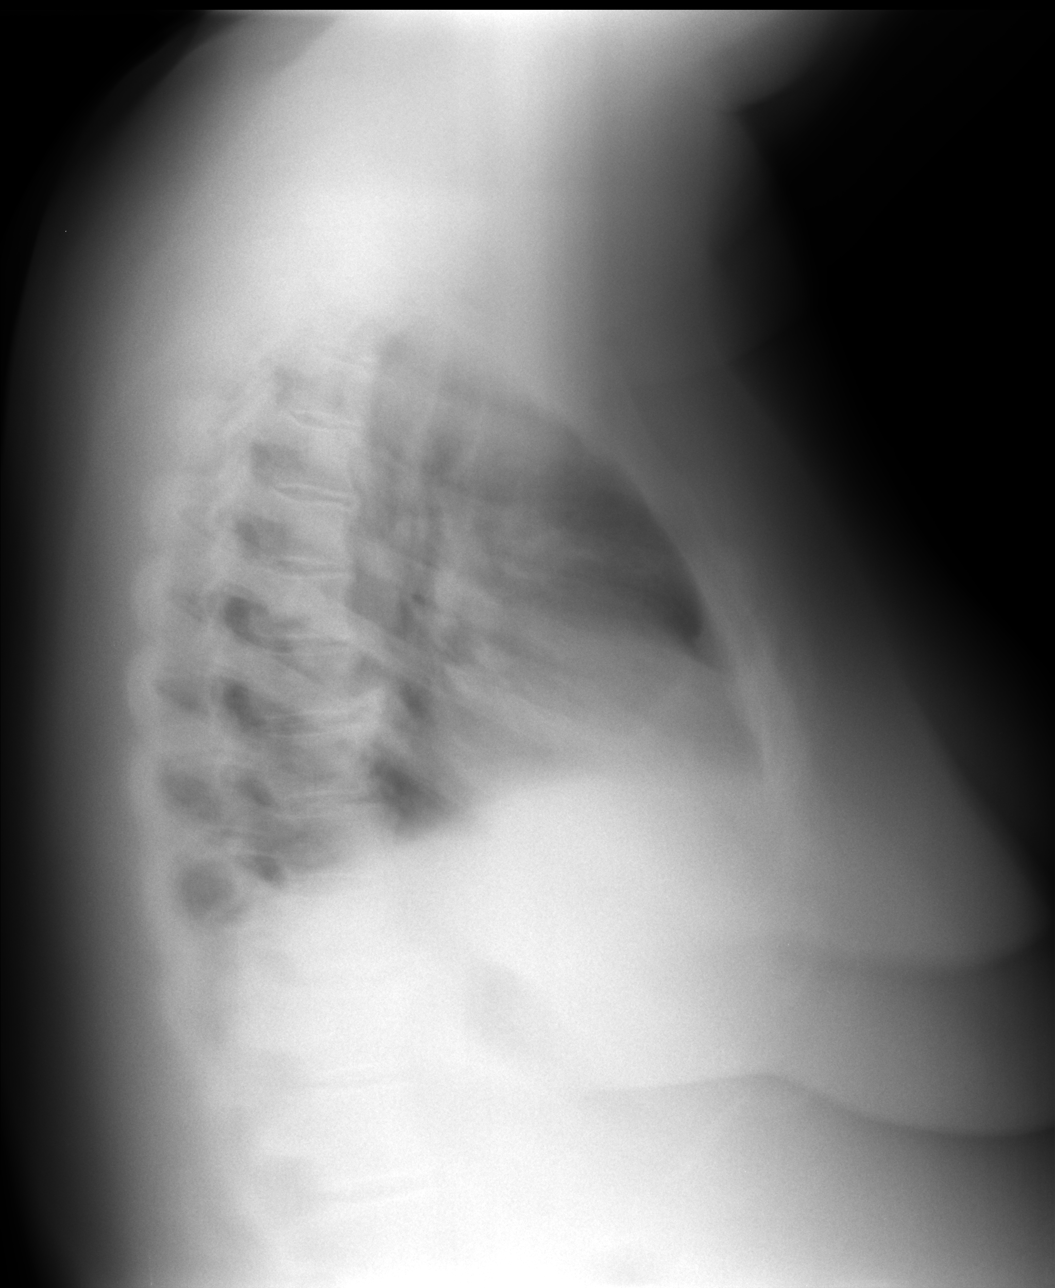

[ap axial]
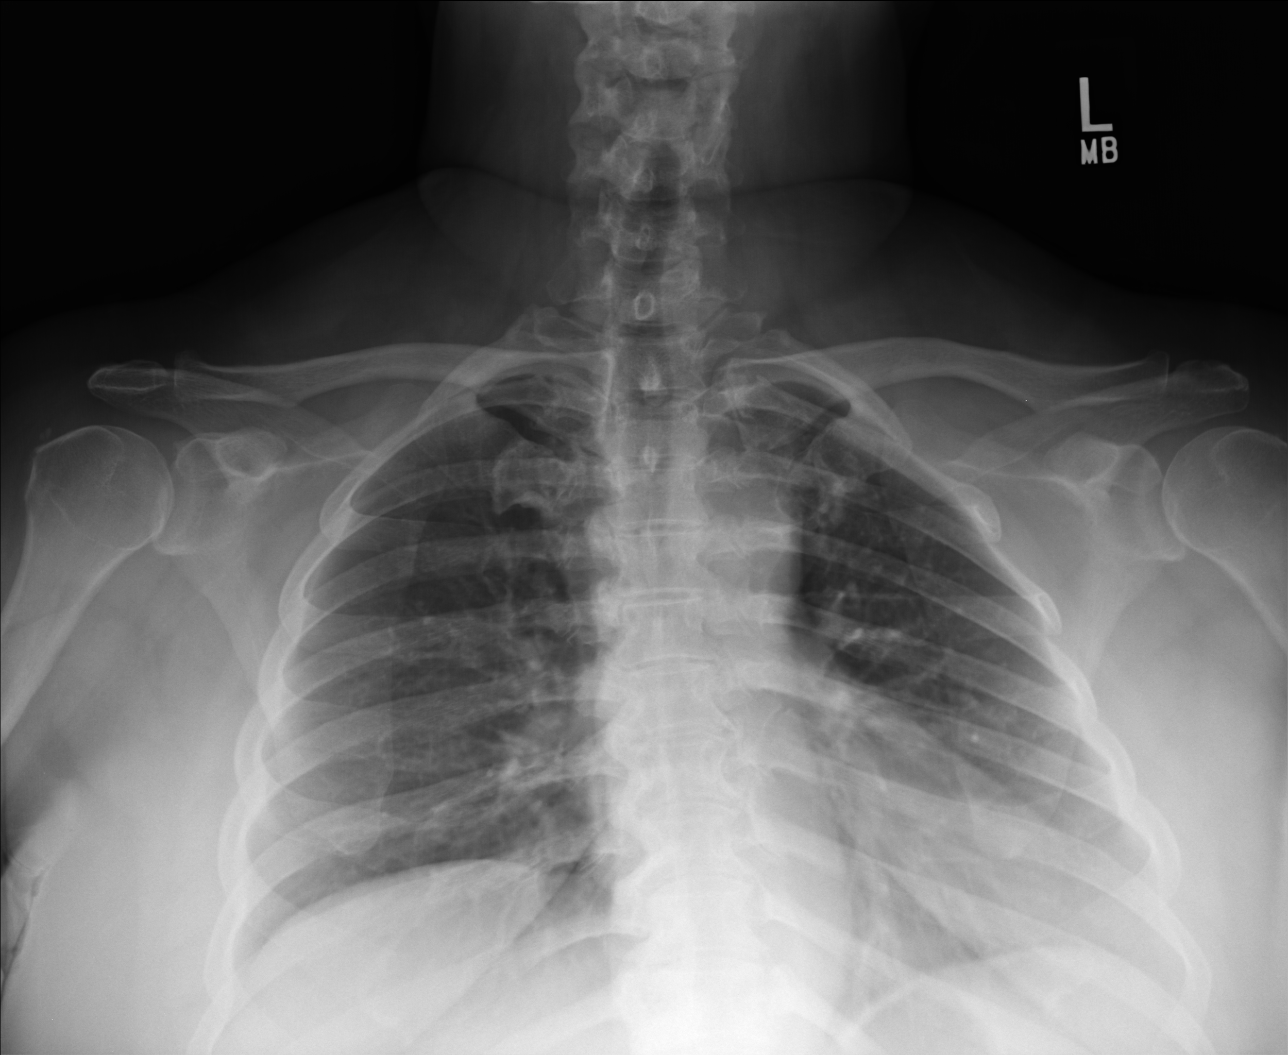

[3 of 3 positions shown; findings below may reference images not displayed]

FINDINGS: The nodular opacity questioned previously overlying the
left anterior first costochondral junction appears to remain with
the first costochondral junction and does have bony margination,
consistent with a bony opacity.  No suspicious lung lesion is seen.
IMPRESSION: No suspicious lung lesion.  The area questioned previously appears
to be bony in origin, remaining with the margins of the anterior
left first costochondral junction.

## 2012-12-26 ENCOUNTER — Other Ambulatory Visit (HOSPITAL_COMMUNITY)
Admission: RE | Admit: 2012-12-26 | Discharge: 2012-12-26 | Disposition: A | Discharge status: Home / Self Care | Payer: BC Managed Care – PPO | Source: Ambulatory Visit | Attending: Gynecology | Admitting: Gynecology

## 2012-12-26 ENCOUNTER — Encounter: Payer: Self-pay | Admitting: Gynecology

## 2012-12-26 ENCOUNTER — Ambulatory Visit (INDEPENDENT_AMBULATORY_CARE_PROVIDER_SITE_OTHER): Payer: BC Managed Care – PPO | Admitting: Gynecology

## 2012-12-26 VITALS — BP 134/80 | Ht 64.5 in | Wt 222.0 lb

## 2012-12-26 DIAGNOSIS — Z01419 Encounter for gynecological examination (general) (routine) without abnormal findings: Secondary | ICD-10-CM

## 2012-12-26 DIAGNOSIS — N926 Irregular menstruation, unspecified: Secondary | ICD-10-CM

## 2012-12-26 DIAGNOSIS — I83893 Varicose veins of bilateral lower extremities with other complications: Secondary | ICD-10-CM

## 2012-12-26 DIAGNOSIS — L292 Pruritus vulvae: Secondary | ICD-10-CM

## 2012-12-26 DIAGNOSIS — L293 Anogenital pruritus, unspecified: Secondary | ICD-10-CM

## 2012-12-26 DIAGNOSIS — L989 Disorder of the skin and subcutaneous tissue, unspecified: Secondary | ICD-10-CM

## 2012-12-26 DIAGNOSIS — Z1151 Encounter for screening for human papillomavirus (HPV): Secondary | ICD-10-CM | POA: Insufficient documentation

## 2012-12-26 MED ORDER — NYSTATIN-TRIAMCINOLONE 100000-0.1 UNIT/GM-% EX OINT: TOPICAL_OINTMENT | Freq: Two times a day (BID) | CUTANEOUS | Status: DC

## 2012-12-26 NOTE — Progress Notes (Signed)
Erika [name] 16-May-1966 [id number]        47 y.o.  G0P0 for annual exam.  Several issues below. Former patient of Dr. Eda Paschal.  Past medical history,surgical history, medications, allergies, family history and social history were all reviewed and documented in the EPIC chart.  ROS:  Performed and pertinent positives and negatives are included in the history, assessment and plan .  Exam: Kim assistant Filed Vitals:   12/26/12 1356  BP: 134/80  Height: 5' 4.5" (1.638 m)  Weight: 222 lb (100.699 kg)   General appearance  Normal Skin grossly small red vascular flaking 3 mm lesion anterior right chest wall below breast. Pedunculated skin tag 1.5 cm left upper anterior thigh. Large ropy varicose vein right knee to thigh region Head/Neck With no cervical or supraclavicular adenopathy thyroid normal. She does have a firm nodular growth left earlobe. Lungs  clear Cardiac RR, without RMG Abdominal  soft, nontender, without masses, organomegaly or hernia Breasts  examined lying and sitting without masses, retractions, discharge or axillary adenopathy. Pelvic  Ext/BUS/vagina  fragile cracking skin posterior fourchette/perineal body area. No specific lesions   Cervix  normal Pap/HPV  Uterus  grossly normal size, shape and contour, midline and mobile nontender. Exam limited by abdominal girth  Adnexa  Without masses or tenderness. Exam limited by abdominal girth    Anus and perineum  normal   Rectovaginal  normal sphincter tone without palpated masses or tenderness.    Assessment/Plan:  47 y.o. G0P0 female for annual exam.   1. Irregular menses/menopausal symptoms. Patient has not had a period since I saw her in May. FSH did return marginal at 31. Long history of PCOS with irregular menses. Having hot flashes night sweats and emotional swings. Review of her record shows her last Pap smear 2012 with Dr. Eda Paschal had endometrial cells. Recommended given her continued irregular menses and  difficulty on pelvic exam given abdominal girth that we proceed with sonohysterogram to rule out endometrial pathology as well as to assess her uterus noting a past history of leiomyoma. Patient will schedule followup for this.  I reviewed the whole issue of HRT with her to include the WHI study with increased risk of stroke, heart attack, DVT and breast cancer. The ACOG and NAMS statements for lowest dose for the shortest period of time reviewed. Transdermal versus oral first-pass effect benefit discussed.  Alternatives such as Effexor to address both her emotional swings and menopausal symptoms reviewed. At this point my recommendation would be trial of low-dose estrogen patch with intermittent progesterone withdrawal to shed the endometrium on a regular basis. Further discuss at her ultrasound appointment. 2. Skin lesions. Patient has 3 skin lesions: #1 red vascular lesion anterior right chest wall below breast. Has been present for several months and is itching. Recommended excision to rule out significant pathology and she agrees to come back for that appointment. #2 pedunculated classic skin tag left upper thigh large and bothersome to the patient. Recommend return in one week biopsy #1 we can excise this also. #3 nodular mass left ear lobe. Recommended followup with ENT for examination possible excision. Patient agrees to arrange and will call me if she has any issues arranging this. 3. Perineal cracking irritation. Patient does have some fragile skin changes in her perineal body. No specific lesions. Recommend trial of Mytrex cream twice a day. If resolves then we'll follow it persists she'll represent to consider biopsy. 4. Varicose veins. Patient has a ropy right leg varicose vein. Intermittent  leg swelling. Recommend followup with vascular specialist to consider options. Patient agrees to arrange. 5. Mammography 04/2012. Schedule this fall when due. SBE monthly reviewed. Pap smear 10/2010. Pap/HPV done  today as per #1. Plan last frequent screening options assuming negative. 6. Health maintenance. No lab work done as it is all done through her other physician's offices to follow her for her medical issues.  Note: This document was prepared with digital dictation and possible smart phrase technology. Any transcriptional errors that result from this process are unintentional.   Dara Lords MD, 3:01 PM 12/26/2012

## 2012-12-26 NOTE — Patient Instructions (Signed)
Followup for ultrasound as scheduled. Schedule appointment with the ENT physician to look at the growth on your year. Followup for skin biopsies.

## 2013-01-09 ENCOUNTER — Ambulatory Visit (INDEPENDENT_AMBULATORY_CARE_PROVIDER_SITE_OTHER): Payer: BC Managed Care – PPO

## 2013-01-09 ENCOUNTER — Other Ambulatory Visit: Payer: Self-pay | Admitting: Gynecology

## 2013-01-09 ENCOUNTER — Ambulatory Visit (INDEPENDENT_AMBULATORY_CARE_PROVIDER_SITE_OTHER): Payer: BC Managed Care – PPO | Admitting: Gynecology

## 2013-01-09 ENCOUNTER — Encounter: Payer: Self-pay | Admitting: Gynecology

## 2013-01-09 DIAGNOSIS — L989 Disorder of the skin and subcutaneous tissue, unspecified: Secondary | ICD-10-CM

## 2013-01-09 DIAGNOSIS — N926 Irregular menstruation, unspecified: Secondary | ICD-10-CM

## 2013-01-09 DIAGNOSIS — D251 Intramural leiomyoma of uterus: Secondary | ICD-10-CM

## 2013-01-09 NOTE — Progress Notes (Addendum)
Patient presents for sonohysterogram due to history of irregular bleeding, PCOS.  Ultrasound shows uterus overall normal size. 2 small myomas largest measuring 24 mm. Right and left ovaries visualized and normal. Cul-de-sac negative. Endometrial echo 3.3 mm.  Sonohysterogram performed, sterile technique, easy catheter introduction, good distention with no abnormalities. Endometrial sample taken. Patient tolerated well.  Assessment and plan: 1. Irregular bleeding, PCOS history, perimenopause timeframe. Is having some hot flashes and sweats. Discussed possible HRT but she is not interested and would prefer observation at this time. Patient will followup for biopsy results. Assuming normal and will keep menstrual calendar and if she goes more than 2 months without menses she is to call for a progesterone withdrawal. We'll plan on intermittent progesterone withdrawal of this coming year and then reassess into next year. If irregular bleeding she knows to represent for further evaluation. 2. Skin lesions. Patient's going to set up an appointment for biopsy/removal of the 2 skin lesions describedin her 12/26/2012 note. 3. Ear lesion. Patient'sin the process of arranging ENT followup for this.

## 2013-01-09 NOTE — Patient Instructions (Signed)
Office will call with biopsy results Schedule skin biopsy

## 2013-01-30 ENCOUNTER — Ambulatory Visit: Payer: BC Managed Care – PPO | Admitting: Gynecology

## 2013-02-06 ENCOUNTER — Encounter: Payer: Self-pay | Admitting: Gynecology

## 2013-02-06 ENCOUNTER — Ambulatory Visit (INDEPENDENT_AMBULATORY_CARE_PROVIDER_SITE_OTHER): Payer: BC Managed Care – PPO | Admitting: Gynecology

## 2013-02-06 DIAGNOSIS — Z87440 Personal history of urinary (tract) infections: Secondary | ICD-10-CM

## 2013-02-06 DIAGNOSIS — L989 Disorder of the skin and subcutaneous tissue, unspecified: Secondary | ICD-10-CM

## 2013-02-06 NOTE — Progress Notes (Signed)
Patient ID: [name], female   DOB: [birth year], 47 y.o.   MRN: [id number] Patient presents to have 2 areas excised from her skin. 1. Right anterior chest below the breast. Flat red patch consistent with cherry angioma but generous in size at 0.5 cm. 2. Pedunculated fibroepithelial polyp left anterior thigh 1- 1.5 cm in size.  Physical Exam  Pulmonary/Chest:    Skin:       Procedure: 1. Skin overlying cherry angioma was cleansed with Betadine, infiltrated with 1% Xylocaine and the lesion was excised in its entirety in an elliptical fashion. Silver nitrate applied for hemostasis with Band-Aid afterwards. 2. Skin surrounding the base of the fibroepithelial polyp was cleansed with Betadine, infiltrated with 1% lidocaine and the lesion was excised at the junction with the normal surrounding skin. Silver nitrate applied afterwards for hemostasis and a Band-Aid applied.  Both specimens were sent to pathology and patient will followup for results.

## 2013-02-06 NOTE — Patient Instructions (Addendum)
Office will call you with biopsy results.  The one lesion that we were looking at looks like a cherry angioma.

## 2013-02-08 LAB — URINE CULTURE: Colony Count: 60000

## 2013-02-12 ENCOUNTER — Ambulatory Visit (INDEPENDENT_AMBULATORY_CARE_PROVIDER_SITE_OTHER): Payer: BC Managed Care – PPO | Admitting: Gynecology

## 2013-02-12 ENCOUNTER — Encounter: Payer: Self-pay | Admitting: Gynecology

## 2013-02-12 DIAGNOSIS — R21 Rash and other nonspecific skin eruption: Secondary | ICD-10-CM

## 2013-02-12 DIAGNOSIS — N926 Irregular menstruation, unspecified: Secondary | ICD-10-CM

## 2013-02-12 DIAGNOSIS — N951 Menopausal and female climacteric states: Secondary | ICD-10-CM

## 2013-02-12 MED ORDER — MEDROXYPROGESTERONE ACETATE 10 MG PO TABS: 10.0000 mg | ORAL_TABLET | Freq: Every day | ORAL | Status: DC

## 2013-02-12 MED ORDER — DOXYCYCLINE MONOHYDRATE 100 MG PO TABS: 100.0000 mg | ORAL_TABLET | Freq: Two times a day (BID) | ORAL | Status: DC

## 2013-02-12 NOTE — Patient Instructions (Addendum)
Use the Provera medication if you do not have a spontaneous period every other month for 10 days to bring on a period. Call if you have irregular or prolonged bleeding. Call if your hot flushes worsen. Start antibiotics twice daily with warm compresses to the biopsy site.

## 2013-02-12 NOTE — Progress Notes (Signed)
Patient presents with several issues: #1 One of the biopsy sites from her mole biopsy has turned red and angry looking. #2 Hot flashes/irregular menses. Workup included a marginal FSH at 31, sonohysterogram showing small myomas with endometrial biopsy showing scant columnar mucosa. We discussed options to include observation, HRT or intermittent progesterone withdrawal.  Exam Right anterior chest wall biopsy site with ring of erythema around darkened scab-like area where silver nitrate was applied. Does not look infectious but more allergic.  Assessment and plan: #1 Probable allergic type reaction to the silver nitrate. Recommend keeping covered with Neosporin cream. Warm soaks to the area and I am going to cover her for cellulitis although it does not look typical with Vibramycin 100 mg twice a day x7 days. Followup if worsens. #2 Discussed her perimenopausal status. She was preferred to go with intermittent progesterone withdrawal. She does not have a menses 6 to 8 weeks she will take 10 days of Provera. If she has irregular atypical bleeding she does report this. If her hot flushes night sweats worsen and she wants to consider more aggressive HRT to include adding estrogen then she'll represent for discussion. Issues and risks to include the WHI study with increased risk of stroke heart attack DVT and possible breast cancer all reviewed.

## 2013-03-19 ENCOUNTER — Encounter (INDEPENDENT_AMBULATORY_CARE_PROVIDER_SITE_OTHER): Payer: Self-pay | Admitting: General Surgery

## 2013-03-19 ENCOUNTER — Encounter (INDEPENDENT_AMBULATORY_CARE_PROVIDER_SITE_OTHER): Payer: Self-pay

## 2013-03-19 ENCOUNTER — Ambulatory Visit (INDEPENDENT_AMBULATORY_CARE_PROVIDER_SITE_OTHER): Payer: BC Managed Care – PPO | Admitting: General Surgery

## 2013-03-19 VITALS — BP 132/86 | HR 84 | Temp 98.2°F | Resp 18 | Ht 64.5 in | Wt 224.4 lb

## 2013-03-19 DIAGNOSIS — K432 Incisional hernia without obstruction or gangrene: Secondary | ICD-10-CM

## 2013-03-19 NOTE — Patient Instructions (Signed)
Please call if area becomes uncomfortable, painful or you notice it is increasing in size  Ventral Hernia A ventral hernia (also called an incisional hernia) is a hernia that occurs at the site of a previous surgical cut (incision) in the abdomen. The abdominal wall spans from your lower chest down to your pelvis. If the abdominal wall is weakened from a surgical incision, a hernia can occur. A hernia is a bulge of bowel or muscle tissue pushing out on the weakened part of the abdominal wall. Ventral hernias can get bigger from straining or lifting. Obese and older people are at higher risk for a ventral hernia. People who develop infections after surgery or require repeat incisions at the same site on the abdomen are also at increased risk. CAUSES  A ventral hernia occurs because of weakness in the abdominal wall at an incision site.  SYMPTOMS  Common symptoms include:  A visible bulge or lump on the abdominal wall.  Pain or tenderness around the lump.  Increased discomfort if you cough or make a sudden movement. If the hernia has blocked part of the intestine, a serious complication can occur (incarcerated or strangulated hernia). This can become a problem that requires emergency surgery because the blood flow to the blocked intestine may be cut off. Symptoms may include:  Feeling sick to your stomach (nauseous).  Throwing up (vomiting).  Stomach swelling (distention) or bloating.  Fever.  Rapid heartbeat. DIAGNOSIS  Your caregiver will take a medical history and perform a physical exam. Various tests may be ordered, such as:  Blood tests.  Urine tests.  Ultrasonography.  X-rays.  Computed tomography (CT). TREATMENT  Watchful waiting may be all that is needed for a smaller hernia that does not cause symptoms. Your caregiver may recommend the use of a supportive belt (truss) that helps to keep the abdominal wall intact. For larger hernias or those that cause pain, surgery to  repair the hernia is usually recommended. If a hernia becomes strangulated, emergency surgery needs to be done right away. HOME CARE INSTRUCTIONS  Avoid putting pressure or strain on the abdominal area.  Avoid heavy lifting.  Use good body positioning for physical tasks. Ask your caregiver about proper body positioning.  Use a supportive belt as directed by your caregiver.  Maintain a healthy weight.  Eat foods that are high in fiber, such as whole grains, fruits, and vegetables. Fiber helps prevent difficult bowel movements (constipation).  Drink enough fluids to keep your urine clear or pale yellow.  Follow up with your caregiver as directed. SEEK MEDICAL CARE IF:   Your hernia seems to be getting larger or more painful. SEEK IMMEDIATE MEDICAL CARE IF:   You have abdominal pain that is sudden and sharp.  Your pain becomes severe.  You have repeated vomiting.  You are sweating a lot.  You notice a rapid heartbeat.  You develop a fever. MAKE SURE YOU:   Understand these instructions.  Will watch your condition.  Will get help right away if you are not doing well or get worse. Document Released: 05/14/2012 Document Reviewed: 05/02/2012 [facility] Patient Information 2014 [place], [place].

## 2013-03-20 DIAGNOSIS — K432 Incisional hernia without obstruction or gangrene: Secondary | ICD-10-CM | POA: Insufficient documentation

## 2013-03-20 NOTE — Progress Notes (Signed)
Patient ID: Erika [name], female   DOB: [birth year], 47 y.o.   MRN: [id number]  Chief Complaint  Patient presents with  . Umbilical Hernia    new pt    HPI Erika [name] is a 47 y.o. female.   HPI 47 year old Caucasian female referred by Dr. Colin Broach for evaluation of a ventral hernia. The patient states that she noticed something sticking out from her umbilicus and pointed that out to her OB/GYN. She states that she was diagnosed with a hernia. It doesn't cause her any discomfort or pain. It irritates her when she is trying on clothing. She has had 2 prior abdominal surgeries. She had a procedure in 2000 and 2004. She has a bowel movement every day. However she takes [employer]. She denies any nausea or vomiting. The area has never been hard or swollen. It is always soft.  She has lost about 40 pounds over the past year through diet and exercise. Past Medical History  Diagnosis Date  . Fibroid   . Cervical dysplasia   . PCOS (polycystic ovarian syndrome)   . Hypothyroid   . Goiter   . Ocular hypertension   . Ependymoma of spinal cord 1999    Radiation and surgery  . Arthritis   . Asthma   . Diabetes mellitus without complication   . Heart murmur   . Hyperlipidemia   . 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 Neuropathy     located fingers and toes    Past Surgical History  Procedure Laterality Date  . Ovary surgery  2000    bilateral ovarian transposition due to radiation treatment for spine lesion  . Dilation and curettage of uterus  2010    endometrial polyp  . Hysteroscopy  2010  . Myomectomy  2004  . Back surgery    . Colposcopy      Family History  Problem Relation Age of Onset  . Hypertension Maternal Aunt   . Breast cancer Maternal Aunt     Age 39  . Thyroid disease Maternal Aunt   . Cancer Maternal Aunt     breast  . Lung cancer Maternal Uncle   . Cancer Maternal Uncle     lung    Social History History    Substance Use Topics  . Smoking status: Former Smoker    Quit date: 08/22/1985  . Smokeless tobacco: Never Used  . Alcohol Use: No    Allergies  Allergen Reactions  . Latex Hives  . Other Other (See Comments)    Allergy to msg   Reaction- headache Allergy to saccharin-  Reaction- headache  . Nitrofurantoin Monohyd Macro Rash  . Sulfa Antibiotics Rash    Current Outpatient Prescriptions  Medication Sig Dispense Refill  . Acetaminophen (TYLENOL PO) Take by mouth as needed.       Marland Kitchen ascorbic Acid (VITAMIN C) 500 MG CPCR Take 500 mg by mouth daily.      . brimonidine (ALPHAGAN P) 0.1 % SOLN Place 2 drops into both eyes 2 (two) times daily.       . Canagliflozin (INVOKANA) 100 MG TABS Take 300 mg by mouth daily.       . cholecalciferol (VITAMIN D) 400 UNITS TABS tablet Take by mouth daily.      . fexofenadine (ALLEGRA) 180 MG tablet Take 180 mg by mouth daily.        Marland Kitchen latanoprost (XALATAN) 0.005 % ophthalmic solution Place 1 drop  into both eyes at bedtime.       Marland Kitchen levothyroxine (SYNTHROID, LEVOTHROID) 25 MCG tablet Take 25 mcg by mouth daily before breakfast.      . medroxyPROGESTERone (PROVERA) 10 MG tablet Take 1 tablet (10 mg total) by mouth daily.  10 tablet  4  . metFORMIN (GLUCOPHAGE) 500 MG tablet 500 mg QID.       . Multiple Vitamin (MULTIVITAMIN) tablet Take 1 tablet by mouth daily.        Marland Kitchen nystatin-triamcinolone ointment (MYCOLOG) Apply 1 application topically as needed.      . Omega-3 Fatty Acids (FISH OIL PO) Take by mouth.      . triamterene-hydrochlorothiazide (DYAZIDE) 37.5-25 MG per capsule TAKE ONE CAPSULE once A DAY       No current facility-administered medications for this visit.    Review of Systems Review of Systems  Constitutional: Negative for fever, chills and unexpected weight change.  HENT: Negative for congestion, hearing loss, sore throat, trouble swallowing and voice change.   Eyes: Negative for visual disturbance.  Respiratory: Negative for cough  and wheezing.        Has asthma, uses inhaler as needed.  Cardiovascular: Positive for leg swelling. Negative for chest pain and palpitations.       Denies chest pain, shortness of breath, dyspnea on exertion, orthopnea, paroxysmal nocturnal dyspnea  Gastrointestinal: Negative for nausea, vomiting, abdominal pain, diarrhea, constipation, blood in stool and anal bleeding.  Genitourinary: Negative for hematuria, vaginal bleeding and difficulty urinating.  Musculoskeletal: Positive for neck pain. Negative for arthralgias.  Skin: Negative for rash and wound.  Neurological: Negative for seizures, syncope and headaches.       Denies TIAs and amaurosis fugax  Hematological: Negative for adenopathy. Does not bruise/bleed easily.  Psychiatric/Behavioral: Negative for confusion.    Blood pressure 132/86, pulse 84, temperature 98.2 F (36.8 C), temperature source Temporal, resp. rate 18, height 5' 4.5" (1.638 m), weight 224 lb 6.4 oz (101.787 kg).  Physical Exam Physical Exam  Vitals reviewed. Constitutional: She is oriented to person, place, and time. She appears well-developed and well-nourished. No distress.  obese  HENT:  Head: Normocephalic and atraumatic.  Right Ear: External ear normal.  Left Ear: External ear normal.  Eyes: Conjunctivae are normal. No scleral icterus.  Neck: Normal range of motion. Neck supple. No tracheal deviation present. No thyromegaly present.  Cardiovascular: Normal rate and normal heart sounds.   Pulmonary/Chest: Effort normal and breath sounds normal. No stridor. No respiratory distress. She has no wheezes.  Abdominal: Soft. She exhibits no distension. There is no tenderness. There is no rebound and no guarding.    3 x 3 cm reducible incisional hernia at the superior of her incision. Soft. Nontender.  Musculoskeletal: She exhibits no edema and no tenderness.  Lymphadenopathy:    She has no cervical adenopathy.  Neurological: She is alert and oriented to  person, place, and time. She exhibits normal muscle tone.  Skin: Skin is warm and dry. No rash noted. She is not diaphoretic. No erythema.  Psychiatric: She has a normal mood and affect. Her behavior is normal. Judgment and thought content normal.    Data Reviewed Dr Reynold Bowen office note  Assessment    Obesity BMI 37.9 Incisional hernia And diabetes mellitus Hypertension Asthma     Plan    We discussed the etiology of ventral incisional hernias. We discussed the signs and symptoms of incarceration and strangulation. The patient was given educational material. I also drew diagrams.  We discussed nonoperative and operative management. With respect to operative management, we discussed both open repair and laparoscopic repair. We discussed the pros and cons of each approach. I discussed the typical aftercare with each procedure and how each procedure differs.  We discussed the risk and benefits of Laparoscopic repair surgery including but not limited to bleeding, infection, injury to surrounding structures, hernia recurrence, mesh complications, hematoma/seroma formation, need to convert to an open procedure, blood clot formation, urinary retention, post operative ileus, general anesthesia risk, long-term abdominal pain. We discussed that this procedure can be quite uncomfortable and difficult to recover from based on how the mesh is secured to the abdominal wall. We discussed the importance of avoiding heavy lifting and straining for a period of 6 weeks.   Because she is asymptomatic and she has demonstrated the ability to lose weight which I congratulated her on, I recommended ongoing weight loss in order to decrease her risk of recurrence prior to surgical intervention. I explained that if we proceeded with hernia repair at this time she is still at increased risk for hernia recurrence given her current size. The patient is agreeable with this plan since she is asymptomatic. We discussed  signs and symptoms of incarceration and patient. She was advised to contact the office if she started to have discomfort around her hernia. She was also advised to contact the office if she noticed that the hernia was getting larger in size. In the interim she is going to work on food choices, diet, and weight loss. Followup 3 months  Mary Sella. Andrey Campanile, MD, FACS General, Bariatric, & Minimally Invasive Surgery [facility], [place]        [facility] M 03/20/2013, 2:38 PM

## 2013-03-25 ENCOUNTER — Telehealth: Payer: Self-pay | Admitting: *Deleted

## 2013-03-25 NOTE — Telephone Encounter (Signed)
(  pt aware you are out of the office) Pt calling to follow up from OV 02/12/13, pt never took provera 10 because spotting started on 03/10/13. Pt now having heavy bleeding with clots all this week. She was without cycle for about 4 months prior to this bleeding. I offered to send this message to nancy, but pt declined. Please advise

## 2013-03-26 NOTE — Telephone Encounter (Signed)
Pt informed with the below note, she will start taking today.

## 2013-03-26 NOTE — Telephone Encounter (Signed)
I would go ahead and take the Provera now to see if it does not resolve the bleeding

## 2013-05-27 ENCOUNTER — Encounter (INDEPENDENT_AMBULATORY_CARE_PROVIDER_SITE_OTHER): Payer: Self-pay | Admitting: General Surgery

## 2013-07-29 ENCOUNTER — Other Ambulatory Visit: Payer: Self-pay | Admitting: Endocrinology

## 2013-07-29 DIAGNOSIS — E049 Nontoxic goiter, unspecified: Secondary | ICD-10-CM

## 2013-08-05 ENCOUNTER — Other Ambulatory Visit: Payer: BC Managed Care – PPO

## 2013-08-12 ENCOUNTER — Ambulatory Visit
Admission: RE | Admit: 2013-08-12 | Discharge: 2013-08-12 | Disposition: A | Discharge status: Home / Self Care | Payer: BC Managed Care – PPO | Source: Ambulatory Visit | Attending: Endocrinology | Admitting: Endocrinology

## 2013-08-12 DIAGNOSIS — E049 Nontoxic goiter, unspecified: Secondary | ICD-10-CM

## 2013-12-24 ENCOUNTER — Ambulatory Visit (INDEPENDENT_AMBULATORY_CARE_PROVIDER_SITE_OTHER): Payer: BC Managed Care – PPO | Admitting: General Surgery

## 2014-02-11 ENCOUNTER — Ambulatory Visit (INDEPENDENT_AMBULATORY_CARE_PROVIDER_SITE_OTHER): Payer: BC Managed Care – PPO | Admitting: Gynecology

## 2014-02-11 ENCOUNTER — Encounter: Payer: Self-pay | Admitting: Gynecology

## 2014-02-11 VITALS — BP 124/80 | Ht 64.0 in | Wt 221.0 lb

## 2014-02-11 DIAGNOSIS — K429 Umbilical hernia without obstruction or gangrene: Secondary | ICD-10-CM

## 2014-02-11 DIAGNOSIS — Z01419 Encounter for gynecological examination (general) (routine) without abnormal findings: Secondary | ICD-10-CM

## 2014-02-11 DIAGNOSIS — N644 Mastodynia: Secondary | ICD-10-CM

## 2014-02-11 DIAGNOSIS — N912 Amenorrhea, unspecified: Secondary | ICD-10-CM

## 2014-02-11 DIAGNOSIS — R21 Rash and other nonspecific skin eruption: Secondary | ICD-10-CM

## 2014-02-11 DIAGNOSIS — K644 Residual hemorrhoidal skin tags: Secondary | ICD-10-CM

## 2014-02-11 NOTE — Progress Notes (Signed)
Erika [name] [birth year] [id number]        48 y.o.  G0P0 for annual exam.  Several issues noted below.  Past medical history,surgical history, problem list, medications, allergies, family history and social history were all reviewed and documented as reviewed in the EPIC chart.  ROS:  12 system ROS performed with pertinent positives and negatives included in the history, assessment and plan.   Additional significant findings :  None   Exam: Erika [name] Vitals:   02/11/14 1528  BP: 124/80  Height: 5\' 4"  (1.626 m)  Weight: 221 lb (100.245 kg)   General appearance:  Normal affect, orientation and appearance. Skin: Grossly normal HEENT: Without gross lesions.  No cervical or supraclavicular adenopathy. Thyroid normal. Small firm nodule left earlobe Lungs:  Clear without wheezing, rales or rhonchi Cardiac: RR, without RMG Abdominal:  Soft, nontender, without masses, guarding, rebound, organomegaly. Small umbilical hernia, easily reduces Breasts:  Examined lying and sitting without masses, retractions, discharge or axillary adenopathy. Pelvic:  Ext/BUS/vagina with generalized groin rash consistent with fungal bilaterally  Cervix normal  Uterus grossly normal size, midline and mobile nontender   Adnexa  Without masses or tenderness    Anus and perineum  Normal with external hemorrhoid  Rectovaginal  Normal sphincter tone without palpated masses or tenderness.    Assessment/Plan:  48 y.o. G0P0 female for annual exam.   1. Irregular menses. LMP reported March with no bleeding since. Is having hot flushes and night sweats. Still would like to achieve pregnancy. Reviewed the high unlikely hood of this and again referred her to Dr Kerin Perna has in the past to consider donor eggs if she desires. She did asked for a blood pregnancy test and I'll go ahead and check an [facility] also. I reviewed options for her symptoms to include OTC products such as soy based as well as HRT. I reviewed the  WHI study with increased risk of stroke heart attack DVT and breast cancer. Patient is not interested in HRT at this time. Will keep menstrual calendar and if she would have prolonged or atypical bleeding or go more than one year without bleeding and then bleed she knows to call me. I did warn her that she may still have another menses or so as she is not past the one-year mark. 2. Groin rash consistent with fungal. She does have a prescription for Mytrex cream that I gave her previously. She just has not been using it this summer. She's going to go ahead and apply this. Assuming the rash resolves we'll follow. 3. Ear nodule stable over years observation patient has never followed up to have it excised but at this point prefers just to watch it. 4. Umbilical hernia. Stable over years observation. Risks signs and symptoms of incarceration reviewed. 5. External hemorrhoid. Occasionally bleeds with bowel movements. Discussed general surgeon referral to consider banding. Patient will followup if she decides to do this. 6. Colonoscopy. Recommended patient move toward scheduling colonoscopy sometime this year as she has turned 2. Patient agrees to do this. 7. Mastalgia. Patient is having some lateral left breast discomfort over the past several months. She is not sure if it's musculoskeletal or breast. Her exam is normal without palpable abnormalities. Her last mammogram was 2013. We'll plan diagnostic mammography and ultrasound of this area. The patient knows importance of followup for this. 8. Pap smear/HPV negative 2014. Plan repeat at 3-5 year interval per current screening guidelines. 9. Health maintenance. No routine blood work done as  this is done through her primary physician's office who follows her for her medical issues. Follow up for mammography/ultrasound and lab results.   Note: This document was prepared with digital dictation and possible smart phrase technology. Any transcriptional errors that  result from this process are unintentional.   [name] MD, 4:01 PM 02/11/2014

## 2014-02-11 NOTE — Patient Instructions (Addendum)
Office will call you to arrange the mammogram and ultrasound of the breast.  Try over the counter soy based products such as Estrovin to see if that does not help with the hot flushes. Schedule colonoscopy with [facility] at [phone] or [facility] at [phone] this coming year Office will call you with the lab results  You may obtain a copy of any labs that were done today by logging onto MyChart as outlined in the instructions provided with your AVS (after visit summary). The office will not call with normal lab results but certainly if there are any significant abnormalities then we will contact you.   Health Maintenance, Female A healthy lifestyle and preventative care can promote health and wellness.  Maintain regular health, dental, and eye exams.  Eat a healthy diet. Foods like vegetables, fruits, whole grains, low-fat dairy products, and lean protein foods contain the nutrients you need without too many calories. Decrease your intake of foods high in solid fats, added sugars, and salt. Get information about a proper diet from your caregiver, if necessary.  Regular physical exercise is one of the most important things you can do for your health. Most adults should get at least 150 minutes of moderate-intensity exercise (any activity that increases your heart rate and causes you to sweat) each week. In addition, most adults need muscle-strengthening exercises on 2 or more days a week.   Maintain a healthy weight. The body mass index (BMI) is a screening tool to identify possible weight problems. It provides an estimate of body fat based on height and weight. Your caregiver can help determine your BMI, and can help you achieve or maintain a healthy weight. For adults 20 years and older:  A BMI below 18.5 is considered underweight.  A BMI of 18.5 to 24.9 is normal.  A BMI of 25 to 29.9 is considered overweight.  A BMI of 30 and above is considered  obese.  Maintain normal blood lipids and cholesterol by exercising and minimizing your intake of saturated fat. Eat a balanced diet with plenty of fruits and vegetables. Blood tests for lipids and cholesterol should begin at age 62 and be repeated every 5 years. If your lipid or cholesterol levels are high, you are over 50, or you are a high risk for heart disease, you may need your cholesterol levels checked more frequently.Ongoing high lipid and cholesterol levels should be treated with medicines if diet and exercise are not effective.  If you smoke, find out from your caregiver how to quit. If you do not use tobacco, do not start.  Lung cancer screening is recommended for adults aged 24 80 years who are at high risk for developing lung cancer because of a history of smoking. Yearly low-dose computed tomography (CT) is recommended for people who have at least a 30-pack-year history of smoking and are a current smoker or have quit within the past 15 years. A pack year of smoking is smoking an average of 1 pack of cigarettes a day for 1 year (for example: 1 pack a day for 30 years or 2 packs a day for 15 years). Yearly screening should continue until the smoker has stopped smoking for at least 15 years. Yearly screening should also be stopped for people who develop a health problem that would prevent them from having lung cancer treatment.  If you are pregnant, do not drink alcohol. If you are breastfeeding, be very cautious about drinking alcohol. If you are not pregnant and  choose to drink alcohol, do not exceed 1 drink per day. One drink is considered to be 12 ounces (355 mL) of beer, 5 ounces (148 mL) of wine, or 1.5 ounces (44 mL) of liquor.  Avoid use of street drugs. Do not share needles with anyone. Ask for help if you need support or instructions about stopping the use of drugs.  High blood pressure causes heart disease and increases the risk of stroke. Blood pressure should be checked at least  every 1 to 2 years. Ongoing high blood pressure should be treated with medicines, if weight loss and exercise are not effective.  If you are 7 to 50 years old, ask your caregiver if you should take aspirin to prevent strokes.  Diabetes screening involves taking a blood sample to check your fasting blood sugar level. This should be done once every 3 years, after age 26, if you are within normal weight and without risk factors for diabetes. Testing should be considered at a younger age or be carried out more frequently if you are overweight and have at least 1 risk factor for diabetes.  Breast cancer screening is essential preventative care for women. You should practice "breast self-awareness." This means understanding the normal appearance and feel of your breasts and may include breast self-examination. Any changes detected, no matter how small, should be reported to a caregiver. Women in their 4s and 30s should have a clinical breast exam (CBE) by a caregiver as part of a regular health exam every 1 to 3 years. After age 47, women should have a CBE every year. Starting at age 84, women should consider having a mammogram (breast X-ray) every year. Women who have a family history of breast cancer should talk to their caregiver about genetic screening. Women at a high risk of breast cancer should talk to their caregiver about having an MRI and a mammogram every year.  Breast cancer gene (BRCA)-related cancer risk assessment is recommended for women who have family members with BRCA-related cancers. BRCA-related cancers include breast, ovarian, tubal, and peritoneal cancers. Having family members with these cancers may be associated with an increased risk for harmful changes (mutations) in the breast cancer genes BRCA1 and BRCA2. Results of the assessment will determine the need for genetic counseling and BRCA1 and BRCA2 testing.  The Pap test is a screening test for cervical cancer. Women should have a  Pap test starting at age 20. Between ages 34 and 76, Pap tests should be repeated every 2 years. Beginning at age 61, you should have a Pap test every 3 years as long as the past 3 Pap tests have been normal. If you had a hysterectomy for a problem that was not cancer or a condition that could lead to cancer, then you no longer need Pap tests. If you are between ages 29 and 64, and you have had normal Pap tests going back 10 years, you no longer need Pap tests. If you have had past treatment for cervical cancer or a condition that could lead to cancer, you need Pap tests and screening for cancer for at least 20 years after your treatment. If Pap tests have been discontinued, risk factors (such as a new sexual partner) need to be reassessed to determine if screening should be resumed. Some women have medical problems that increase the chance of getting cervical cancer. In these cases, your caregiver may recommend more frequent screening and Pap tests.  The human papillomavirus (HPV) test is an additional test  that may be used for cervical cancer screening. The HPV test looks for the virus that can cause the cell changes on the cervix. The cells collected during the Pap test can be tested for HPV. The HPV test could be used to screen women aged 89 years and older, and should be used in women of any age who have unclear Pap test results. After the age of 35, women should have HPV testing at the same frequency as a Pap test.  Colorectal cancer can be detected and often prevented. Most routine colorectal cancer screening begins at the age of 3 and continues through age 22. However, your caregiver may recommend screening at an earlier age if you have risk factors for colon cancer. On a yearly basis, your caregiver may provide home test kits to check for hidden blood in the stool. Use of a small camera at the end of a tube, to directly examine the colon (sigmoidoscopy or colonoscopy), can detect the earliest forms of  colorectal cancer. Talk to your caregiver about this at age 3, when routine screening begins. Direct examination of the colon should be repeated every 5 to 10 years through age 51, unless early forms of pre-cancerous polyps or small growths are found.  Hepatitis C blood testing is recommended for all people born from [birth year] through 1967 and any individual with known risks for hepatitis C.  Practice safe sex. Use condoms and avoid high-risk sexual practices to reduce the spread of sexually transmitted infections (STIs). Sexually active women aged 93 and younger should be checked for Chlamydia, which is a common sexually transmitted infection. Older women with new or multiple partners should also be tested for Chlamydia. Testing for other STIs is recommended if you are sexually active and at increased risk.  Osteoporosis is a disease in which the bones lose minerals and strength with aging. This can result in serious bone fractures. The risk of osteoporosis can be identified using a bone density scan. Women ages 41 and over and women at risk for fractures or osteoporosis should discuss screening with their caregivers. Ask your caregiver whether you should be taking a calcium supplement or vitamin D to reduce the rate of osteoporosis.  Menopause can be associated with physical symptoms and risks. Hormone replacement therapy is available to decrease symptoms and risks. You should talk to your caregiver about whether hormone replacement therapy is right for you.  Use sunscreen. Apply sunscreen liberally and repeatedly throughout the day. You should seek shade when your shadow is shorter than you. Protect yourself by wearing long sleeves, pants, a wide-brimmed hat, and sunglasses year round, whenever you are outdoors.  Notify your caregiver of new moles or changes in moles, especially if there is a change in shape or color. Also notify your caregiver if a mole is larger than the size of a pencil eraser.  Stay  current with your immunizations. Document Released: 12/11/2010 Document Revised: 09/22/2012 Document Reviewed: 12/11/2010 [facility] Patient Information 2014 [place].

## 2014-02-12 LAB — URINALYSIS W MICROSCOPIC + REFLEX CULTURE
Bacteria, UA: NONE SEEN
Bilirubin Urine: NEGATIVE
Casts: NONE SEEN
Crystals: NONE SEEN
HGB URINE DIPSTICK: NEGATIVE
Ketones, ur: NEGATIVE mg/dL
Leukocytes, UA: NEGATIVE
Nitrite: NEGATIVE
PROTEIN: NEGATIVE mg/dL
Specific Gravity, Urine: >1.03 — ABNORMAL HIGH (ref 1.005–1.030)
Squamous Epithelial / LPF: NONE SEEN
Urobilinogen, UA: 0.2 mg/dL (ref 0.0–1.0)
pH: 6.5 (ref 5.0–8.0)

## 2014-02-12 LAB — HCG, SERUM, QUALITATIVE: PREG SERUM: NEGATIVE

## 2014-02-12 LAB — FOLLICLE STIMULATING HORMONE: FSH: 48 m[IU]/mL

## 2014-02-24 ENCOUNTER — Telehealth: Payer: Self-pay | Admitting: *Deleted

## 2014-02-24 DIAGNOSIS — N644 Mastodynia: Secondary | ICD-10-CM

## 2014-02-24 NOTE — Telephone Encounter (Signed)
Message copied by Thamas Jaegers on Wed Feb 24, 2014  8:52 AM ------      Message from: Alen Blew      Created: Tue Feb 23, 2014  4:48 PM       See Dr Zelphia Cairo note on 9/3 about a DX mammo and [place]. Later afternoons are best. If you can please schedule. She didn't want to wait for to me schedule this afternoon therefore can you do it? Thank you. Sharrie Rothman ------

## 2014-02-24 NOTE — Telephone Encounter (Signed)
Appointment 03/05/14 @ 3:15 pm

## 2014-02-24 NOTE — Telephone Encounter (Signed)
Orders placed for breast center, they will contact pt to schedule.

## 2014-03-05 ENCOUNTER — Ambulatory Visit
Admission: RE | Admit: 2014-03-05 | Discharge: 2014-03-05 | Disposition: A | Discharge status: Home / Self Care | Payer: BC Managed Care – PPO | Source: Ambulatory Visit | Attending: Gynecology | Admitting: Gynecology

## 2014-03-05 DIAGNOSIS — N644 Mastodynia: Secondary | ICD-10-CM

## 2014-03-23 ENCOUNTER — Other Ambulatory Visit: Payer: Self-pay | Admitting: Otolaryngology

## 2014-03-23 DIAGNOSIS — E042 Nontoxic multinodular goiter: Secondary | ICD-10-CM

## 2014-04-02 ENCOUNTER — Ambulatory Visit
Admission: RE | Admit: 2014-04-02 | Discharge: 2014-04-02 | Disposition: A | Discharge status: Home / Self Care | Payer: BC Managed Care – PPO | Source: Ambulatory Visit | Attending: Otolaryngology | Admitting: Otolaryngology

## 2014-04-02 DIAGNOSIS — E042 Nontoxic multinodular goiter: Secondary | ICD-10-CM

## 2014-09-06 ENCOUNTER — Encounter: Payer: Self-pay | Admitting: Gynecology

## 2014-09-06 ENCOUNTER — Ambulatory Visit (INDEPENDENT_AMBULATORY_CARE_PROVIDER_SITE_OTHER): Payer: BC Managed Care – PPO | Admitting: Gynecology

## 2014-09-06 VITALS — BP 140/86

## 2014-09-06 DIAGNOSIS — I83009 Varicose veins of unspecified lower extremity with ulcer of unspecified site: Secondary | ICD-10-CM | POA: Diagnosis not present

## 2014-09-06 DIAGNOSIS — N926 Irregular menstruation, unspecified: Secondary | ICD-10-CM | POA: Diagnosis not present

## 2014-09-06 DIAGNOSIS — R609 Edema, unspecified: Secondary | ICD-10-CM

## 2014-09-06 DIAGNOSIS — L97909 Non-pressure chronic ulcer of unspecified part of unspecified lower leg with unspecified severity: Secondary | ICD-10-CM

## 2014-09-06 LAB — PREGNANCY, URINE: PREG TEST UR: NEGATIVE

## 2014-09-06 NOTE — Addendum Note (Signed)
Addended by: Nelva Nay on: 09/06/2014 04:11 PM   Modules accepted: Orders

## 2014-09-06 NOTE — Patient Instructions (Signed)
Follow up for ultrasound as scheduled.  Schedule an appointment with the vein specialist  Schedule an appointment with your primary physician to be evaluated for the swelling

## 2014-09-06 NOTE — Progress Notes (Addendum)
Erika [name] Apr 12, 1966 [id number]        49 y.o.  G0P0 Presents complaining of bleeding over the last 2 weeks. Her last menses was one year ago and then started bleeding 2 weeks ago initially fairly heavy but now just staining. Also had some bloating and swelling proceeding the bleeding. [facility] 02/2014 was 51. Patient also notes worsening pedal edema.  Had been prescribed triamterene hydrochlorothiazide Dr. Cherylann Banas several years ago but was refilled by Dr. Chalmers Cater. Was taking 2 a day but now is 1 a day. No chest discomfort, difficulty breathing or other symptoms. Has chronic varicose veins.  Past medical history,surgical history, problem list, medications, allergies, family history and social history were all reviewed and documented in the EPIC chart.  Directed ROS with pertinent positives and negatives documented in the history of present illness/assessment and plan.  Exam: cam assistant Filed Vitals:   09/06/14 1512  BP: 140/86   General appearance:  Normal Lungs clear bilaterally. Cardiac regular rate without rubs murmurs or gallops Abdomen soft nontender with umbilical hernia no masses guarding rebound Pelvic external BUS vagina normal with brownish staining. Cervix normal. Uterus difficult to palpate but grossly normal midline mobile. Adnexa without gross masses or tenderness. Lower extremities with pitting edema bilaterally and evidence of stasis changes. Multiple varicosities both legs.  Assessment/Plan:  49 y.o. G0P0 perimenopausal with episode of bleeding at one year from her last menses. Recommend sonohysterogram for pelvic surveillance given her abdominal girth and difficulty on bimanual as well as allowing for endometrial assessment rule out hyperplasia. Patient with peripheral edema, pitting. Recommended ASAP follow up with her primary physician to assess for cardiac/renal status. No overt evidence of cardiac failure on exam.  Had been taking diuretic initially started by Dr. Cherylann Banas  and refilled by Dr. Michiel Sites. I reviewed with her that she really needs to be evaluated by her primary physician and if they feel chronic diuresis necessary to be monitored by them. Also recommended evaluation by the vein specialist in reference to her multiple varicosities which are not helping with her peripheral swelling and stasis. Lastly we did discuss the issues of DVT but I see no overt evidence as she has symmetrical bilateral edema and swelling without focal erythema tenderness or other evidence of DVT. Patient will follow up for ultrasound here but will also coordinate appointments with her primary physician and the vein specialist.  UPT was negative today for completeness.    [name] MD, 3:49 PM 09/06/2014

## 2014-09-07 ENCOUNTER — Other Ambulatory Visit: Payer: Self-pay | Admitting: Gynecology

## 2014-09-07 DIAGNOSIS — R198 Other specified symptoms and signs involving the digestive system and abdomen: Secondary | ICD-10-CM

## 2014-09-07 DIAGNOSIS — N950 Postmenopausal bleeding: Secondary | ICD-10-CM

## 2014-09-08 ENCOUNTER — Other Ambulatory Visit: Payer: Self-pay | Admitting: Gynecology

## 2014-09-08 ENCOUNTER — Ambulatory Visit (INDEPENDENT_AMBULATORY_CARE_PROVIDER_SITE_OTHER): Payer: BC Managed Care – PPO | Admitting: Gynecology

## 2014-09-08 ENCOUNTER — Encounter: Payer: Self-pay | Admitting: Gynecology

## 2014-09-08 ENCOUNTER — Ambulatory Visit (INDEPENDENT_AMBULATORY_CARE_PROVIDER_SITE_OTHER): Payer: BC Managed Care – PPO

## 2014-09-08 VITALS — BP 124/82

## 2014-09-08 DIAGNOSIS — N950 Postmenopausal bleeding: Secondary | ICD-10-CM

## 2014-09-08 DIAGNOSIS — D252 Subserosal leiomyoma of uterus: Secondary | ICD-10-CM

## 2014-09-08 DIAGNOSIS — R198 Other specified symptoms and signs involving the digestive system and abdomen: Secondary | ICD-10-CM

## 2014-09-08 DIAGNOSIS — N8320 Unspecified ovarian cysts: Secondary | ICD-10-CM

## 2014-09-08 DIAGNOSIS — N926 Irregular menstruation, unspecified: Secondary | ICD-10-CM | POA: Diagnosis not present

## 2014-09-08 DIAGNOSIS — N83202 Unspecified ovarian cyst, left side: Secondary | ICD-10-CM

## 2014-09-08 DIAGNOSIS — R1900 Intra-abdominal and pelvic swelling, mass and lump, unspecified site: Secondary | ICD-10-CM

## 2014-09-08 LAB — URINE CULTURE

## 2014-09-08 MED ORDER — AMPICILLIN 500 MG PO CAPS: 500.0000 mg | ORAL_CAPSULE | Freq: Four times a day (QID) | ORAL | Status: DC

## 2014-09-08 NOTE — Progress Notes (Signed)
Erika [name] 09/03/1965 [id number]        49 y.o.  G0P0 Presents for sonohysterogram due to episode of postmenopausal bleeding with bleeding after one year without menses.  Past medical history,surgical history, problem list, medications, allergies, family history and social history were all reviewed and documented in the EPIC chart.  Directed ROS with pertinent positives and negatives documented in the history of present illness/assessment and plan.  Exam: [place] assistant Filed Vitals:   09/08/14 1657  BP: 124/82   General appearance:  Normal External BUS vagina normal. Cervix normal.  Uterus grossly normal in size. 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 Endometrial echo 11 mm. Right ovary normal. Left ovary with thin-walled 41mm low level cyst. Cul-de-sac negative.  Sonohysterogram performed, sterile technique, easy catheter introduction, good distention with no abnormalities. Endometrial biopsy taken. Patient tolerated well.  Assessment/Plan:  49 y.o. G0P0 with episode of bleeding after one year without menses. Now no longer bleeding. Patient will follow up for endometrial biopsy. Assuming negative, plan expectant management with reporting any further bleeding.    [name] MD, 5:12 PM 09/08/2014

## 2014-09-08 NOTE — Patient Instructions (Signed)
Office will call you with biopsy results. At this point we will keep track of your bleeding pattern. If no further bleeding we will monitor. If any further bleeding call the office.

## 2014-09-20 ENCOUNTER — Other Ambulatory Visit: Payer: Self-pay | Admitting: Endocrinology

## 2014-09-20 DIAGNOSIS — E049 Nontoxic goiter, unspecified: Secondary | ICD-10-CM

## 2014-09-23 ENCOUNTER — Ambulatory Visit
Admission: RE | Admit: 2014-09-23 | Discharge: 2014-09-23 | Disposition: A | Discharge status: Home / Self Care | Payer: BC Managed Care – PPO | Source: Ambulatory Visit | Attending: Endocrinology | Admitting: Endocrinology

## 2014-09-23 DIAGNOSIS — E049 Nontoxic goiter, unspecified: Secondary | ICD-10-CM

## 2014-09-29 LAB — URINALYSIS W MICROSCOPIC + REFLEX CULTURE
BILIRUBIN URINE: NEGATIVE
Casts: NONE SEEN
Crystals: NONE SEEN
Glucose, UA: 500 mg/dL — AB
Ketones, ur: NEGATIVE mg/dL
Leukocytes, UA: NEGATIVE
Nitrite: NEGATIVE
Protein, ur: NEGATIVE mg/dL
UROBILINOGEN UA: 0.2 mg/dL (ref 0.0–1.0)
WBC, UA: NONE SEEN WBC/hpf (ref <3)
pH: 5.5 (ref 5.0–8.0)

## 2014-10-14 ENCOUNTER — Ambulatory Visit: Payer: BC Managed Care – PPO | Admitting: *Deleted

## 2014-10-19 ENCOUNTER — Telehealth: Payer: Self-pay | Admitting: *Deleted

## 2014-10-19 MED ORDER — MEGESTROL ACETATE 20 MG PO TABS: 20.0000 mg | ORAL_TABLET | Freq: Two times a day (BID) | ORAL | Status: DC

## 2014-10-19 NOTE — Telephone Encounter (Signed)
Megace 20 mg twice a day 5 days. Assuming bleeding resolves and follow. If recurs then I may want to take another look with ultrasound.

## 2014-10-19 NOTE — Telephone Encounter (Signed)
Pt informed, Rx sent. 

## 2014-10-19 NOTE — Telephone Encounter (Signed)
Pt called to follow up from [place] 09/08/14 states no bleeding in April, spotting started on 10/12/14 lasted for 2 days then stopped. Pt said then yesterday medium flow with clots,today very light spotting. Told to follow up if bleeding should reoccur. Please advise

## 2014-11-03 ENCOUNTER — Telehealth: Payer: Self-pay | Admitting: *Deleted

## 2014-11-03 DIAGNOSIS — N950 Postmenopausal bleeding: Secondary | ICD-10-CM

## 2014-11-03 NOTE — Telephone Encounter (Signed)
Rosemarie Ax will call and relay to patient

## 2014-11-03 NOTE — Telephone Encounter (Signed)
(  pt aware you are out of the office) Pt called to follow up from telephone encounter 10/19/14. Took megace and bleeding did stop, started spotting for a couple of days, light brown stopping now. Per note you wanted ultrasound, pelvic or SHGM? Please advise

## 2014-11-03 NOTE — Telephone Encounter (Signed)
[place]

## 2014-11-05 ENCOUNTER — Telehealth: Payer: Self-pay | Admitting: *Deleted

## 2014-11-05 MED ORDER — MEGESTROL ACETATE 20 MG PO TABS: 20.0000 mg | ORAL_TABLET | Freq: Two times a day (BID) | ORAL | Status: DC

## 2014-11-05 NOTE — Telephone Encounter (Signed)
Pt scheduled for [facility] on 11/29/14 with Dr.Fontaine, states bleeding has become heavier, changing pads every 2-3 hours now, no pain. Asked if you could give her a Rx for bleeding since the weekend is here? Please advise

## 2014-11-05 NOTE — Telephone Encounter (Signed)
Erika [name], (has a elevated FSH 48 02/2014). Megace 20 mg twice daily until bleeding stops #20. Review importance of keeping scheduled sonohysterogram appointment with Dr. Phineas Real.

## 2014-11-05 NOTE — Telephone Encounter (Signed)
Pt informed, Rx sent. 

## 2014-11-09 ENCOUNTER — Telehealth: Payer: Self-pay | Admitting: *Deleted

## 2014-11-09 ENCOUNTER — Encounter: Payer: Self-pay | Admitting: *Deleted

## 2014-11-09 ENCOUNTER — Encounter: Payer: BC Managed Care – PPO | Attending: Endocrinology | Admitting: *Deleted

## 2014-11-09 VITALS — Ht 65.0 in | Wt 219.8 lb

## 2014-11-09 DIAGNOSIS — E119 Type 2 diabetes mellitus without complications: Secondary | ICD-10-CM | POA: Diagnosis not present

## 2014-11-09 DIAGNOSIS — Z713 Dietary counseling and surveillance: Secondary | ICD-10-CM | POA: Insufficient documentation

## 2014-11-09 MED ORDER — NORETHINDRONE ACETATE 5 MG PO TABS: 5.0000 mg | ORAL_TABLET | Freq: Every day | ORAL | Status: DC

## 2014-11-09 NOTE — Telephone Encounter (Signed)
Pt was prescribed megace 40 mg BID on 11/05/14 by nancy, had to stop Rx noticed swollen ankles, elevated blood sugar. Has SHGM scheduled on 11/29/14, bleeding has slowed down, but not stopped. Please advise

## 2014-11-09 NOTE — Progress Notes (Signed)
Diabetes Self-Management Education  Visit Type: First/Initial (DX: 10/2012)  Appt. Start Time: 1530 Appt. End Time: 1700  11/09/2014  Ms. Erika [name], identified by name and date of birth, is a 49 y.o. female with a diagnosis of Diabetes: Type 2.  Other people present during visit:  Patient . Stori notes she is here for either a tune-up or a kick in the behind. She has previously done well with weight loss and A1c control. However, she is struggling again.  ASSESSMENT  Height 5\' 5"  (1.651 m), weight 219 lb 12.8 oz (99.701 kg), last menstrual period 08/20/2014. Body mass index is 36.58 kg/(m^2).  Initial Visit Information:   Are you taking your medications as prescribed?: Yes Are you checking your feet?: Yes How often do you need to have someone help you when you read instructions, pamphlets, or other written materials from your doctor or pharmacy?: 1 - Never  Psychosocial:   Patient Belief/Attitude about Diabetes: Motivated to manage diabetes Self-care barriers: None Self-management support: Doctor's office, CDE visits Other persons present: Patient Patient Concerns: Nutrition/Meal planning, Healthy Lifestyle, Weight Control Special Needs: None Preferred Learning Style: No preference indicated Learning Readiness: Change in progress  Complications:   Last HgB A1C per patient/outside source: 8.1 mg/dL How often do you check your blood sugar?: 1-2 times/day Fasting Blood glucose range (mg/dL): 180-200 (180-213) Have you had a dilated eye exam in the past 12 months?: Yes Have you had a dental exam in the past 12 months?: Yes  Diet Intake:  Breakfast: crackers, protein bar, banana, coffeE Lunch: sandwich, fruit, yogurt Snack (afternoon): chips & salsa, carrots & ranch dip Dinner: salade, whole wheat pasta, pesto sauce Beverage(s): water, occasional gingerale, unsweet tea  Exercise:  Exercise: Light (walking / raking leaves)  Individualized Plan for Diabetes  Self-Management Training:   Learning Objective:  Patient will have a greater understanding of diabetes self-management. Patient education plan per assessed needs and concerns is to attend individual sessions     Education Topics Reviewed with Patient Today:  Factors that contribute to the development of diabetes, Explored patient's options for treatment of their diabetes Role of diet in the treatment of diabetes and the relationship between the three main macronutrients and blood glucose level, Food label reading, portion sizes and measuring food., Carbohydrate counting, Meal options for control of blood glucose level and chronic complications. Role of exercise on diabetes management, blood pressure control and cardiac health., Helped patient identify appropriate exercises in relation to his/her diabetes, diabetes complications and other health issue. Reviewed patients medication for diabetes, action, purpose, timing of dose and side effects. Purpose and frequency of SMBG., Identified appropriate SMBG and/or A1C goals., Yearly dilated eye exam, Daily foot exams, Taught/discussed recording of test results and interpretation of SMBG. Relationship between chronic complications and blood glucose control, Assessed and discussed foot care and prevention of foot problems, Dental care, Retinopathy and reason for yearly dilated eye exams Role of stress on diabetes  PATIENTS GOALS/Plan (Developed by the patient):  Nutrition: General guidelines for healthy choices and portions discussed Physical Activity: Exercise 3-5 times per week, 30 minutes per day Reducing Risk: do foot checks daily   Patient Instructions  Plan:  Aim for 2-3 Carb Choices per meal (30-45 grams) +/- 1 either way  Aim for 0-15 Carbs per snack if hungry  Include protein in moderation with your meals and snacks Consider reading food labels for Total Carbohydrate and Fat Grams of foods Consider  increasing your activity level by  walking/go to  go to gym  for 30 minutes daily as tolerated Consider checking BG at alternate times per day as directed by MD  Continue taking medication as directed by MD  Expected Outcomes:  Demonstrated interest in learning. Expect positive outcomes  Education material provided: Living Well with Diabetes, A1C conversion sheet, Meal plan card, My Plate and Snack sheet  If problems or questions, patient to contact team via:  Phone  Future DSME appointment: 4-6 wks

## 2014-11-09 NOTE — Telephone Encounter (Signed)
Pt informed with the below note, Rx sent. 

## 2014-11-09 NOTE — Patient Instructions (Signed)
Plan:  Aim for 2-3 Carb Choices per meal (30-45 grams) +/- 1 either way  Aim for 0-15 Carbs per snack if hungry  Include protein in moderation with your meals and snacks Consider reading food labels for Total Carbohydrate and Fat Grams of foods Consider  increasing your activity level by walking/go to go to gym  for 30 minutes daily as tolerated Consider checking BG at alternate times per day as directed by MD  Continue taking medication as directed by MD

## 2014-11-09 NOTE — Telephone Encounter (Signed)
We can try Aygestin 5 mg daily through sonohysterogram appointment

## 2014-11-10 ENCOUNTER — Other Ambulatory Visit: Payer: Self-pay | Admitting: Gynecology

## 2014-11-10 DIAGNOSIS — N950 Postmenopausal bleeding: Secondary | ICD-10-CM

## 2014-11-16 ENCOUNTER — Telehealth: Payer: Self-pay | Admitting: *Deleted

## 2014-11-16 NOTE — Telephone Encounter (Signed)
Since it seems to be helping with the bleeding I would recommend trying an every other day to see how she does as far as blood pressure/swelling. The other thing is try to move up her sonohysterogram (I do not know when it is scheduled)

## 2014-11-16 NOTE — Telephone Encounter (Signed)
Left detailed message on pt voicemail.

## 2014-11-16 NOTE — Telephone Encounter (Signed)
Pt was prescribed Aygestin 5 mg daily through sonohysterogram appointment which is scheduled on 11/29/14. States this medication is also causing elevated blood sugar readings and swollen ankles and hands. Bleeding has slowed down, any recommendations. Please advise

## 2014-11-18 NOTE — Telephone Encounter (Signed)
Pt called back and SHGM has been moved to 11/24/14 stopped pills for 3 days now, I advised pt to do as TF said below as take medication every other day. Pt will try this

## 2014-11-19 ENCOUNTER — Telehealth: Payer: Self-pay | Admitting: Gynecology

## 2014-11-19 NOTE — Telephone Encounter (Signed)
11/19/14-Pt was informed today that her [facility] ins will cover the sonohysterogram she has scheduled under her $30 copay.wl

## 2014-11-24 ENCOUNTER — Ambulatory Visit (INDEPENDENT_AMBULATORY_CARE_PROVIDER_SITE_OTHER): Payer: BC Managed Care – PPO

## 2014-11-24 ENCOUNTER — Encounter: Payer: Self-pay | Admitting: Gynecology

## 2014-11-24 ENCOUNTER — Ambulatory Visit (INDEPENDENT_AMBULATORY_CARE_PROVIDER_SITE_OTHER): Payer: BC Managed Care – PPO | Admitting: Gynecology

## 2014-11-24 ENCOUNTER — Other Ambulatory Visit: Payer: Self-pay | Admitting: Gynecology

## 2014-11-24 VITALS — BP 132/80

## 2014-11-24 DIAGNOSIS — N950 Postmenopausal bleeding: Secondary | ICD-10-CM

## 2014-11-24 DIAGNOSIS — N9489 Other specified conditions associated with female genital organs and menstrual cycle: Secondary | ICD-10-CM | POA: Diagnosis not present

## 2014-11-24 DIAGNOSIS — N926 Irregular menstruation, unspecified: Secondary | ICD-10-CM

## 2014-11-24 NOTE — Progress Notes (Addendum)
KORRI ASK [birth year] [id number]        49 y.o.  G0P0 presents for sonohysterogram. Patient has history of no menses over the past year until March when she started bleeding on and off for 2 weeks. Her [place] was 49 last year. Sonohysterogram performed showed an empty cavity with a benign biopsy. She has continued to bleed on and off since then requiring progesterone suppression currently on Aygestin 5 mg. I asked her to repeat the sonohysterogram to make sure we are not missing anything.  Past medical history,surgical history, problem list, medications, allergies, family history and social history were all reviewed and documented in the EPIC chart.  Directed ROS with pertinent positives and negatives documented in the history of present illness/assessment and plan.  Exam: [place] assistant Filed Vitals:   11/24/14 1546  BP: 132/80   General appearance:  Normal External BUS vagina normal. Cervix normal. Uterus difficult to palpate but grossly normal midline mobile nontender. Adnexa without masses or tenderness.  Ultrasound shows uterus to be normal size and echotexture. Small 27 mm myoma noted. Endometrial echo of 3.4 mm. Right and left ovaries grossly normal. Cul-de-sac negative.  Sonohysterogram performed, sterile technique, easy catheter introduction, good distention with a small 6 x 6 x 5 mm right posterior wall defect consistent with endometrial polyp noted. Endometrial sample taken. Patient tolerated well.  Assessment/Plan:  49 y.o. G0P0 with continued perimenopausal bleeding. Sonohysterogram is consistent with small endometrial polyp. Recommended that we proceed with hysteroscopy D&C with resection of this polyp. Patient agrees with this. I reviewed the proposed surgery with the patient to include the expected intraoperative and postoperative courses as well as the recovery period. The use of the hysteroscope, resectoscope and the D&C portion were all discussed. The risks of surgery to  include infection, prolonged antibiotics, hemorrhage necessitating transfusion and the risks of transfusion, including transfusion reaction, hepatitis, HIV, mad cow disease and other unknown entities were all discussed understood and accepted. The risk of damage to internal organs during the procedure, either immediately recognized or delay recognized, including vagina, cervix, uterus, possible perforation causing damage to bowel, bladder, ureters, vessels and nerves necessitating major exploratory reparative surgery and future reparative surgeries including bladder repair, ureteral damage repair, bowel resection, ostomy formation was also discussed understood and accepted. The potential for distended media absorption leading to metabolic complications such as fluid overload, coma and seizures was also discussed understood and accepted. She understands there are no guarantees that this will relieve her irregular bleeding and that her bleeding may continue or worsen following the procedure. We'll go ahead and move towards scheduling the surgery and she'll follow up for short preoperative consult before hand.     [name] MD, 4:53 PM 11/24/2014

## 2014-11-24 NOTE — Patient Instructions (Signed)
Office will call you to arrange surgery. 

## 2014-11-25 ENCOUNTER — Telehealth: Payer: Self-pay

## 2014-11-25 ENCOUNTER — Encounter: Payer: Self-pay | Admitting: Gynecology

## 2014-11-25 MED ORDER — MISOPROSTOL 200 MCG PO TABS: ORAL_TABLET | ORAL | Status: DC

## 2014-11-25 NOTE — Telephone Encounter (Signed)
I called patient and reviewed ins benefits and her estimated surgery prepymt amount due to GGA by one week prior to surgery. I told her I will send her a letter about this as well. Surgery set for 12/14/14 at 10:00am at [facility].  I informed her regarding need for Cytotec tab vaginally hs before surgery. Rx sent. Transferred to [facility] to schedule pre op cons with Dr. Loetta Rough.

## 2014-11-29 ENCOUNTER — Ambulatory Visit: Payer: BC Managed Care – PPO | Admitting: Gynecology

## 2014-11-29 ENCOUNTER — Other Ambulatory Visit: Payer: BC Managed Care – PPO

## 2014-12-06 ENCOUNTER — Ambulatory Visit (INDEPENDENT_AMBULATORY_CARE_PROVIDER_SITE_OTHER): Payer: BC Managed Care – PPO | Admitting: Gynecology

## 2014-12-06 ENCOUNTER — Encounter: Payer: Self-pay | Admitting: Gynecology

## 2014-12-06 VITALS — BP 130/84

## 2014-12-06 DIAGNOSIS — N926 Irregular menstruation, unspecified: Secondary | ICD-10-CM | POA: Diagnosis not present

## 2014-12-06 NOTE — Patient Instructions (Signed)
Followup for surgery as scheduled. 

## 2014-12-06 NOTE — H&P (Signed)
  Erika [name] [birth year] [id number]   History and Physical  Chief complaint: perimenopausal bleeding, endometrial polyp  History of present illness: 49 y.o. G0P0 the history of no menses over the past year until March when she started bleeding  On and off for up to 2 weeks at a time. Sonohysterogram was performed which showed an empty cavity with a benign biopsy. She continued to bleed requiring daily suppression with Aygestin 5 mg.  Repeat ultrasound showed a small 27 mm myoma with endometrial echo 3.4 mm. Sonohysterogram showed a small 6 x 6 x 5 mm right posterior wall defect consistent with an endometrial polyp.  Repeat biopsy again showed benign endometrial tissue. Patient is admitted for hysteroscopy D&C with resection of the endometrial polyp due to her ongoing bleeding.  Past medical history,surgical history, medications, allergies, family history and social history were all reviewed and documented in the EPIC chart.  ROS:  Was performed and pertinent positives and negatives are included in the history of present illness.  Exam:  Kim assistant General: well developed, well nourished female, no acute distress HEENT: normal  Lungs: clear to auscultation without wheezing, rales or rhonchi  Cardiac: regular rate without rubs, murmurs or gallops  Abdomen: soft, nontender without masses, guarding, rebound, organomegaly  Pelvic: external bus vagina: normal with light bleeding Cervix: grossly normal  Uterus: normal size, midline and mobile, nontender  Adnexa: without masses or tenderness    Assessment/Plan:  49 y.o. G0P0 with continued perimenopausal bleeding. Sonohysterogram is consistent with small endometrial polyp. Recommended that we proceed with hysteroscopy D&C with resection of this polyp. Patient agrees with this. I reviewed the proposed surgery with the patient to include the expected intraoperative and postoperative courses as well as the recovery period. The use of the  hysteroscope, resectoscope and the D&C portion were all discussed. The risks of surgery to include infection, prolonged antibiotics, hemorrhage necessitating transfusion and the risks of transfusion, including transfusion reaction, hepatitis, HIV, mad cow disease and other unknown entities were all discussed understood and accepted. The risk of damage to internal organs during the procedure, either immediately recognized or delay recognized, including vagina, cervix, uterus, possible perforation causing damage to bowel, bladder, ureters, vessels and nerves necessitating major exploratory reparative surgery and future reparative surgeries including bladder repair, ureteral damage repair, bowel resection, ostomy formation was also discussed understood and accepted. The potential for distended media absorption leading to metabolic complications such as fluid overload, coma and seizures was also discussed understood and accepted. She understands there are no guarantees that this will relieve her irregular bleeding and that her bleeding may continue or worsen following the procedure.  Her questions were answered to her satisfaction and she is ready to proceed with surgery.     [name] MD, 4:14 PM 12/06/2014

## 2014-12-06 NOTE — Progress Notes (Signed)
Erika [name] April 10, 1966 [id number]   Preoperative consultl  Chief complaint: perimenopausal bleeding, endometrial polyp  History of present illness: 49 y.o. G0P0 the history of no menses over the past year until March when she started bleeding  On and off for up to 2 weeks at a time. Sonohysterogram was performed which showed an empty cavity with a benign biopsy. She continued to bleed requiring daily suppression with Aygestin 5 mg.  Repeat ultrasound showed a small 27 mm myoma with endometrial echo 3.4 mm. Sonohysterogram showed a small 6 x 6 x 5 mm right posterior wall defect consistent with an endometrial polyp.  Repeat biopsy again showed benign endometrial tissue. Patient is admitted for hysteroscopy D&C with resection of the endometrial polyp due to her ongoing bleeding.  Past medical history,surgical history, medications, allergies, family history and social history were all reviewed and documented in the EPIC chart.  ROS:  Was performed and pertinent positives and negatives are included in the history of present illness.  Exam:  Kim assistant General: well developed, well nourished female, no acute distress HEENT: normal  Lungs: clear to auscultation without wheezing, rales or rhonchi  Cardiac: regular rate without rubs, murmurs or gallops  Abdomen: soft, nontender without masses, guarding, rebound, organomegaly  Pelvic: external bus vagina: normal with light bleeding Cervix: grossly normal  Uterus: normal size, midline and mobile, nontender  Adnexa: without masses or tenderness    Assessment/Plan:  49 y.o. G0P0 with continued perimenopausal bleeding. Sonohysterogram is consistent with small endometrial polyp. Recommended that we proceed with hysteroscopy D&C with resection of this polyp. Patient agrees with this. I reviewed the proposed surgery with the patient to include the expected intraoperative and postoperative courses as well as the recovery period. The use of the  hysteroscope, resectoscope and the D&C portion were all discussed. The risks of surgery to include infection, prolonged antibiotics, hemorrhage necessitating transfusion and the risks of transfusion, including transfusion reaction, hepatitis, HIV, mad cow disease and other unknown entities were all discussed understood and accepted. The risk of damage to internal organs during the procedure, either immediately recognized or delay recognized, including vagina, cervix, uterus, possible perforation causing damage to bowel, bladder, ureters, vessels and nerves necessitating major exploratory reparative surgery and future reparative surgeries including bladder repair, ureteral damage repair, bowel resection, ostomy formation was also discussed understood and accepted. The potential for distended media absorption leading to metabolic complications such as fluid overload, coma and seizures was also discussed understood and accepted. She understands there are no guarantees that this will relieve her irregular bleeding and that her bleeding may continue or worsen following the procedure.  Her questions were answered to her satisfaction and she is ready to proceed with surgery.     [name] MD, 3:55 PM 12/06/2014

## 2014-12-07 ENCOUNTER — Encounter (HOSPITAL_COMMUNITY)
Admission: RE | Admit: 2014-12-07 | Discharge: 2014-12-07 | Disposition: A | Discharge status: Home / Self Care | Payer: BC Managed Care – PPO | Source: Ambulatory Visit | Attending: Gynecology | Admitting: Gynecology

## 2014-12-07 ENCOUNTER — Other Ambulatory Visit: Payer: Self-pay

## 2014-12-07 ENCOUNTER — Encounter (HOSPITAL_COMMUNITY): Payer: Self-pay

## 2014-12-07 DIAGNOSIS — Z01818 Encounter for other preprocedural examination: Secondary | ICD-10-CM | POA: Diagnosis present

## 2014-12-07 DIAGNOSIS — N840 Polyp of corpus uteri: Secondary | ICD-10-CM | POA: Diagnosis not present

## 2014-12-07 DIAGNOSIS — N939 Abnormal uterine and vaginal bleeding, unspecified: Secondary | ICD-10-CM | POA: Insufficient documentation

## 2014-12-07 HISTORY — DX: Other seasonal allergic rhinitis: J30.2

## 2014-12-07 HISTORY — DX: Myoneural disorder, unspecified: G70.9

## 2014-12-07 HISTORY — DX: Female infertility, unspecified: N97.9

## 2014-12-07 HISTORY — DX: Essential (primary) hypertension: I10

## 2014-12-07 LAB — COMPREHENSIVE METABOLIC PANEL
ALBUMIN: 4.5 g/dL (ref 3.5–5.0)
ALK PHOS: 62 U/L (ref 38–126)
ALT: 31 U/L (ref 14–54)
AST: 26 U/L (ref 15–41)
Anion gap: 8 (ref 5–15)
BILIRUBIN TOTAL: 0.6 mg/dL (ref 0.3–1.2)
BUN: 14 mg/dL (ref 6–20)
CO2: 26 mmol/L (ref 22–32)
Calcium: 9.8 mg/dL (ref 8.9–10.3)
Chloride: 100 mmol/L — ABNORMAL LOW (ref 101–111)
Creatinine, Ser: 0.62 mg/dL (ref 0.44–1.00)
GFR calc Af Amer: >60 mL/min (ref >60)
GFR calc non Af Amer: >60 mL/min (ref >60)
Glucose, Bld: 187 mg/dL — ABNORMAL HIGH (ref 65–99)
POTASSIUM: 3.7 mmol/L (ref 3.5–5.1)
Sodium: 134 mmol/L — ABNORMAL LOW (ref 135–145)
TOTAL PROTEIN: 8.4 g/dL — AB (ref 6.5–8.1)

## 2014-12-07 LAB — CBC
HEMATOCRIT: 41 % (ref 36.0–46.0)
Hemoglobin: 14.1 g/dL (ref 12.0–15.0)
MCH: 29.2 pg (ref 26.0–34.0)
MCHC: 34.4 g/dL (ref 30.0–36.0)
MCV: 84.9 fL (ref 78.0–100.0)
PLATELETS: 258 10*3/uL (ref 150–400)
RBC: 4.83 MIL/uL (ref 3.87–5.11)
RDW: 13.4 % (ref 11.5–15.5)
WBC: 7.2 10*3/uL (ref 4.0–10.5)

## 2014-12-07 NOTE — Pre-Procedure Instructions (Signed)
Patient wanted to see anesthesia but Dr. Royce Macadamia not available at PAT appt.  Patient ok to see anesthesia on DOS.  Dr. Royce Macadamia informed.

## 2014-12-07 NOTE — Patient Instructions (Addendum)
   Your procedure is scheduled on:  Tuesday, July 5  Enter through the [employer] [facility] at: 8:45 AM Pick up the phone at the desk and dial [phone] and inform us of your arrival.  Please call this number if you have any problems the morning of surgery: [phone]  Remember: Do not eat or drink after midnight: Monday Take these medicines the morning of surgery with a SIP OF WATER:  Levothyroxine, triamtereme-hctz, and allegra if needed.  Withhold metformin on Monday night and Tuesday morning dose.  We will check you sugar upon arrival to Short Stay Dept.  Do not wear jewelry, make-up, or FINGER nail polish No metal in your hair or on your body. Do not wear lotions, powders, perfumes.  You may wear deodorant.  Do not bring valuables to the hospital. Contacts may not be worn into surgery.  Patients discharged on the day of surgery will not be allowed to drive home.  Home with husband Erika [name] cell [phone]

## 2014-12-07 NOTE — H&P (Deleted)
  Erika [name] 10-Oct-1965 [id number]   History and Physical  Chief complaint: postmenopausal bleeding, endometrial polyp  History of present illness: 49 y.o. G0P0 with history of several episodes of vaginal bleeding over the past year. Sonohysterogram performed which showed an 8 mm endometrial polyp. Endometrial sample showed inflammatory cells no real endometrial tissue. Patient admitted for hysteroscopy D&C and removal of her endometrial polyp.  Patient with significant cardiac history and underwent preoperative clearance through her cardiologist Dr. Ellyn Hack. She is to stop her aspirin and Plavix 5 days preoperative.  Past medical history,surgical history, medications, allergies, family history and social history were all reviewed and documented in the EPIC chart.  ROS:  Was performed and pertinent positives and negatives are included in the history of present illness.  Exam:  Kim assistant 12/07/2014 General: well developed, well nourished female, no acute distress HEENT: normal  Lungs: clear to auscultation without wheezing, rales or rhonchi  Cardiac: regular rate without rubs, murmurs or gallops  Abdomen: soft, nontender without masses, guarding, rebound, organomegaly  Pelvic: external bus vagina: normal with atrophic changes Cervix: grossly normal with atrophic changes Uterus: normal size, midline and mobile, nontender  Adnexa: without masses or tenderness    Assessment/Plan:  49 y.o. G0P0 with several episodes of postmenopausal bleeding. Endometrial sample inadequate. Sonohysterogram shows 8 mm endometrial polyp. Patient admitted for hysteroscopy D&C, resection of her endometrial polyp.  I reviewed the proposed surgery with the patient to include the expected intraoperative and postoperative courses as well as the recovery period. The use of the hysteroscope, resectoscope and the D&C portion were all discussed. The risks of surgery to include infection, prolonged antibiotics, hemorrhage  necessitating transfusion and the risks of transfusion, including transfusion reaction, hepatitis, HIV, mad cow disease and other unknown entities were all discussed understood and accepted. The risk of damage to internal organs during the procedure, either immediately recognized or delay recognized, including vagina, cervix, uterus, possible perforation causing damage to bowel, bladder, ureters, vessels and nerves necessitating major exploratory reparative surgery and future reparative surgeries including bladder repair, ureteral damage repair, bowel resection, ostomy formation was also discussed understood and accepted. The potential for distended media absorption leading to metabolic complications such as fluid overload, coma and seizures was also discussed understood and accepted. She does have a significant cardiac history in the general surgical risks associated with anesthesia to include luminary and cardiac were discussed with her. Patient did receive preoperative clearance from her cardiologist as noted in [place] 11/30/2014 note. The patient's questions were answered to her satisfaction and she is ready to proceed with surgery.      [name] MD, 4:45 PM 12/07/2014

## 2014-12-08 ENCOUNTER — Telehealth: Payer: Self-pay | Admitting: *Deleted

## 2014-12-08 MED ORDER — NORETHINDRONE ACETATE 5 MG PO TABS: 5.0000 mg | ORAL_TABLET | Freq: Every day | ORAL | Status: DC

## 2014-12-08 NOTE — Telephone Encounter (Signed)
Pt scheduled for D & C 12/14/14. Not currently bleeding now, asked if you want her to continue Aygestin 5 mg so bleeding won't restart? If yes will need refill.  Please advise

## 2014-12-08 NOTE — Anesthesia Preprocedure Evaluation (Addendum)
Anesthesia Evaluation  Patient identified by MRN, date of birth, ID band Patient awake    Reviewed: Allergy & Precautions, NPO status , Patient's Chart, lab work & pertinent test results  Airway Mallampati: II   Neck ROM: Full    Dental  (+) Dental Advisory Given, Teeth Intact   Pulmonary former smoker (quit 1987),  breath sounds clear to auscultation        Cardiovascular hypertension, Pt. on medications Rhythm:Regular     Neuro/Psych Depression    GI/Hepatic negative GI ROS, Neg liver ROS,   Endo/Other  diabetes, Poorly Controlled, Type 2  Renal/GU negative Renal ROS     Musculoskeletal   Abdominal (+) + obese,   Peds  Hematology 14/41   Anesthesia Other Findings Large Thyroid goiter, does not complain of difficulty swallowing or breathing.  Is being followed for this.  LMA planned  Reproductive/Obstetrics                            Anesthesia Physical Anesthesia Plan  ASA: III  Anesthesia Plan: General   Post-op Pain Management:    Induction: Intravenous  Airway Management Planned: LMA and Oral ETT  Additional Equipment:   Intra-op Plan:   Post-operative Plan:   Informed Consent: I have reviewed the patients History and Physical, chart, labs and discussed the procedure including the risks, benefits and alternatives for the proposed anesthesia with the patient or authorized representative who has indicated his/her understanding and acceptance.     Plan Discussed with:   Anesthesia Plan Comments:         Anesthesia Quick Evaluation

## 2014-12-08 NOTE — Telephone Encounter (Signed)
Pt informed with the below note, Rx sent. Pt also mention that the pharmacy gave her #60 tablets for Cytotec. I advised that she should only have 1 tablet of Cytotec to place at bedtime before surgery next week . I told her to take Rx back to pharmacy and let them know this is wrong.

## 2014-12-08 NOTE — Telephone Encounter (Signed)
I would stay on it for now. Refill her #30 just so she has a supply in case she bleeds after surgery.

## 2014-12-13 MED ORDER — DEXTROSE 5 % IV SOLN
2.0000 g | INTRAVENOUS | Status: AC
  Administered 2014-12-14: 2 g via INTRAVENOUS
  Filled 2014-12-13: qty 2

## 2014-12-14 ENCOUNTER — Ambulatory Visit (HOSPITAL_COMMUNITY): Payer: BC Managed Care – PPO | Admitting: Anesthesiology

## 2014-12-14 ENCOUNTER — Encounter (HOSPITAL_COMMUNITY)
Admission: RE | Disposition: A | Discharge status: Home / Self Care | Payer: Self-pay | Source: Ambulatory Visit | Attending: Gynecology

## 2014-12-14 ENCOUNTER — Ambulatory Visit (HOSPITAL_COMMUNITY)
Admission: RE | Admit: 2014-12-14 | Discharge: 2014-12-14 | Disposition: A | Discharge status: Home / Self Care | Payer: BC Managed Care – PPO | Source: Ambulatory Visit | Attending: Gynecology | Admitting: Gynecology

## 2014-12-14 DIAGNOSIS — R21 Rash and other nonspecific skin eruption: Secondary | ICD-10-CM | POA: Insufficient documentation

## 2014-12-14 DIAGNOSIS — N840 Polyp of corpus uteri: Secondary | ICD-10-CM | POA: Diagnosis not present

## 2014-12-14 DIAGNOSIS — N926 Irregular menstruation, unspecified: Secondary | ICD-10-CM

## 2014-12-14 DIAGNOSIS — Z79899 Other long term (current) drug therapy: Secondary | ICD-10-CM | POA: Insufficient documentation

## 2014-12-14 DIAGNOSIS — E669 Obesity, unspecified: Secondary | ICD-10-CM | POA: Diagnosis not present

## 2014-12-14 DIAGNOSIS — I10 Essential (primary) hypertension: Secondary | ICD-10-CM | POA: Insufficient documentation

## 2014-12-14 DIAGNOSIS — E1165 Type 2 diabetes mellitus with hyperglycemia: Secondary | ICD-10-CM | POA: Diagnosis not present

## 2014-12-14 DIAGNOSIS — E049 Nontoxic goiter, unspecified: Secondary | ICD-10-CM | POA: Diagnosis not present

## 2014-12-14 DIAGNOSIS — Z87891 Personal history of nicotine dependence: Secondary | ICD-10-CM | POA: Diagnosis not present

## 2014-12-14 HISTORY — PX: DILATATION & CURETTAGE/HYSTEROSCOPY WITH MYOSURE: SHX6511

## 2014-12-14 LAB — PREGNANCY, URINE: Preg Test, Ur: NEGATIVE

## 2014-12-14 LAB — GLUCOSE, CAPILLARY
Glucose-Capillary: 162 mg/dL — ABNORMAL HIGH (ref 65–99)
Glucose-Capillary: 155 mg/dL — ABNORMAL HIGH (ref 65–99)

## 2014-12-14 SURGERY — DILATATION & CURETTAGE/HYSTEROSCOPY WITH MYOSURE
Anesthesia: General

## 2014-12-14 MED ORDER — SCOPOLAMINE 1 MG/3DAYS TD PT72
1.0000 | MEDICATED_PATCH | Freq: Once | TRANSDERMAL | Status: DC
  Administered 2014-12-14: 1.5 mg via TRANSDERMAL

## 2014-12-14 MED ORDER — DEXAMETHASONE SODIUM PHOSPHATE 10 MG/ML IJ SOLN
INTRAMUSCULAR | Status: AC
  Filled 2014-12-14: qty 1

## 2014-12-14 MED ORDER — LACTATED RINGERS IV SOLN
INTRAVENOUS | Status: DC
  Administered 2014-12-14 (×2): via INTRAVENOUS

## 2014-12-14 MED ORDER — FENTANYL CITRATE (PF) 100 MCG/2ML IJ SOLN
INTRAMUSCULAR | Status: AC
  Filled 2014-12-14: qty 2

## 2014-12-14 MED ORDER — OXYCODONE-ACETAMINOPHEN 5-325 MG PO TABS: 1.0000 | ORAL_TABLET | ORAL | Status: DC | PRN

## 2014-12-14 MED ORDER — FENTANYL CITRATE (PF) 100 MCG/2ML IJ SOLN
25.0000 ug | INTRAMUSCULAR | Status: DC | PRN
  Administered 2014-12-14: 25 ug via INTRAVENOUS

## 2014-12-14 MED ORDER — PROMETHAZINE HCL 25 MG/ML IJ SOLN: 6.2500 mg | INTRAMUSCULAR | Status: DC | PRN

## 2014-12-14 MED ORDER — LIDOCAINE HCL (CARDIAC) 20 MG/ML IV SOLN
INTRAVENOUS | Status: DC | PRN
  Administered 2014-12-14: 40 mg via INTRAVENOUS

## 2014-12-14 MED ORDER — ONDANSETRON HCL 4 MG/2ML IJ SOLN
INTRAMUSCULAR | Status: AC
  Filled 2014-12-14: qty 2

## 2014-12-14 MED ORDER — LIDOCAINE HCL (CARDIAC) 20 MG/ML IV SOLN
INTRAVENOUS | Status: AC
  Filled 2014-12-14: qty 5

## 2014-12-14 MED ORDER — PROPOFOL 10 MG/ML IV BOLUS
INTRAVENOUS | Status: DC | PRN
  Administered 2014-12-14: 150 mg via INTRAVENOUS

## 2014-12-14 MED ORDER — KETOROLAC TROMETHAMINE 30 MG/ML IJ SOLN
INTRAMUSCULAR | Status: AC
  Filled 2014-12-14: qty 1

## 2014-12-14 MED ORDER — LIDOCAINE HCL 1 % IJ SOLN
INTRAMUSCULAR | Status: DC | PRN
  Administered 2014-12-14: 10 mL

## 2014-12-14 MED ORDER — MEPERIDINE HCL 25 MG/ML IJ SOLN: 6.2500 mg | INTRAMUSCULAR | Status: DC | PRN

## 2014-12-14 MED ORDER — MIDAZOLAM HCL 5 MG/5ML IJ SOLN
INTRAMUSCULAR | Status: DC | PRN
  Administered 2014-12-14: 2 mg via INTRAVENOUS

## 2014-12-14 MED ORDER — SCOPOLAMINE 1 MG/3DAYS TD PT72
MEDICATED_PATCH | TRANSDERMAL | Status: AC
  Administered 2014-12-14: 1.5 mg via TRANSDERMAL
  Filled 2014-12-14: qty 1

## 2014-12-14 MED ORDER — DEXAMETHASONE SODIUM PHOSPHATE 10 MG/ML IJ SOLN
INTRAMUSCULAR | Status: DC | PRN
  Administered 2014-12-14: 10 mg via INTRAVENOUS

## 2014-12-14 MED ORDER — KETOROLAC TROMETHAMINE 30 MG/ML IJ SOLN
INTRAMUSCULAR | Status: DC | PRN
  Administered 2014-12-14: 30 mg via INTRAVENOUS

## 2014-12-14 MED ORDER — LIDOCAINE HCL 1 % IJ SOLN
INTRAMUSCULAR | Status: AC
  Filled 2014-12-14: qty 20

## 2014-12-14 MED ORDER — MIDAZOLAM HCL 2 MG/2ML IJ SOLN
INTRAMUSCULAR | Status: AC
  Filled 2014-12-14: qty 2

## 2014-12-14 MED ORDER — SCOPOLAMINE 1 MG/3DAYS TD PT72
MEDICATED_PATCH | TRANSDERMAL | Status: AC
  Filled 2014-12-14: qty 1

## 2014-12-14 MED ORDER — SODIUM CHLORIDE 0.9 % IR SOLN
Status: DC | PRN
  Administered 2014-12-14: 3000 mL

## 2014-12-14 MED ORDER — ONDANSETRON HCL 4 MG/2ML IJ SOLN
INTRAMUSCULAR | Status: DC | PRN
  Administered 2014-12-14: 4 mg via INTRAVENOUS

## 2014-12-14 MED ORDER — FENTANYL CITRATE (PF) 100 MCG/2ML IJ SOLN
INTRAMUSCULAR | Status: DC | PRN
  Administered 2014-12-14: 50 ug via INTRAVENOUS
  Administered 2014-12-14 (×2): 25 ug via INTRAVENOUS

## 2014-12-14 MED ORDER — PROPOFOL 10 MG/ML IV BOLUS
INTRAVENOUS | Status: AC
  Filled 2014-12-14: qty 20

## 2014-12-14 SURGICAL SUPPLY — 15 items
CATH ROBINSON RED A/P 16FR (CATHETERS) ×3 IMPLANT
CLOTH BEACON ORANGE TIMEOUT ST (SAFETY) ×3 IMPLANT
CONTAINER PREFILL 10% NBF 60ML (FORM) ×6 IMPLANT
DEVICE MYOSURE CLASSIC (MISCELLANEOUS) IMPLANT
DEVICE MYOSURE LITE (MISCELLANEOUS) IMPLANT
FILTER ARTHROSCOPY CONVERTOR (FILTER) ×3 IMPLANT
GLOVE BIO SURGEON STRL SZ7.5 (GLOVE) ×6 IMPLANT
GOWN STRL REUS W/TWL LRG LVL3 (GOWN DISPOSABLE) ×6 IMPLANT
PACK VAGINAL MINOR WOMEN LF (CUSTOM PROCEDURE TRAY) ×3 IMPLANT
PAD OB MATERNITY 4.3X12.25 (PERSONAL CARE ITEMS) ×3 IMPLANT
SEAL ROD LENS SCOPE MYOSURE (ABLATOR) ×3 IMPLANT
TOWEL OR 17X24 6PK STRL BLUE (TOWEL DISPOSABLE) ×6 IMPLANT
TUBING AQUILEX INFLOW (TUBING) ×3 IMPLANT
TUBING AQUILEX OUTFLOW (TUBING) ×3 IMPLANT
WATER STERILE IRR 1000ML POUR (IV SOLUTION) ×3 IMPLANT

## 2014-12-14 NOTE — Anesthesia Procedure Notes (Signed)
Procedure Name: LMA Insertion Date/Time: 12/14/2014 9:43 AM Performed by: Barkley Boards L Pre-anesthesia Checklist: Patient identified, Emergency Drugs available, Suction available and Patient being monitored Patient Re-evaluated:Patient Re-evaluated prior to inductionOxygen Delivery Method: Circle system utilized Preoxygenation: Pre-oxygenation with 100% oxygen Intubation Type: IV induction Ventilation: Mask ventilation without difficulty LMA: LMA inserted LMA Size: 4.0 Number of attempts: 1 Dental Injury: Teeth and Oropharynx as per pre-operative assessment

## 2014-12-14 NOTE — Anesthesia Postprocedure Evaluation (Signed)
  Anesthesia Post-op Note  Patient: Erika [name]  Procedure(s) Performed: Procedure(s) with comments: DILATATION & CURETTAGE/HYSTEROSCOPY WITH MYOSURE (N/A) - to follow 2nd case 10:00?  needs one hour OR time  Patient Location: PACU  Anesthesia Type:General  Level of Consciousness: awake, alert  and oriented  Airway and Oxygen Therapy: Patient Spontanous Breathing  Post-op Pain: mild  Post-op Assessment: Post-op Vital signs reviewed              Post-op Vital Signs: Reviewed and stable  Last Vitals:  Filed Vitals:   12/14/14 1011  BP: 143/78  Pulse: 97  Temp: 36.6 C  Resp: 16    Complications: No apparent anesthesia complications

## 2014-12-14 NOTE — Transfer of Care (Signed)
Immediate Anesthesia Transfer of Care Note  Patient: Erika [name]  Procedure(s) Performed: Procedure(s) with comments: DILATATION & CURETTAGE/HYSTEROSCOPY WITH MYOSURE (N/A) - to follow 2nd case 10:00?  needs one hour OR time  Patient Location: PACU  Anesthesia Type:General  Level of Consciousness: awake  Airway & Oxygen Therapy: Patient Spontanous Breathing and Patient connected to nasal cannula oxygen  Post-op Assessment: Report given to RN and Post -op Vital signs reviewed and stable  Post vital signs: stable  Last Vitals:  Filed Vitals:   12/14/14 0848  BP: 147/82  Pulse: 94  Temp: 37.3 C  Resp: 16    Complications: No apparent anesthesia complications

## 2014-12-14 NOTE — Discharge Instructions (Signed)
° °  Postoperative Instructions Hysteroscopy D & C ° °Dr. Gurshaan Matsuoka and the nursing staff have discussed postoperative instructions with you.  If you have any questions please ask them before you leave the hospital, or call Dr Shunsuke Granzow’s office at [phone].   ° °We would like to emphasize the following instructions: ° ° °? Call the office to make your follow-up appointment as recommended by Dr Davyd Podgorski (usually 1-2 weeks). ° °? You were given a prescription, or one was ordered for you at the pharmacy you designated.  Get that prescription filled and take the medication according to instructions. ° °? You may eat a regular diet, but slowly until you start having bowel movements. ° °? Drink plenty of water daily. ° °? Nothing in the vagina (intercourse, douching, objects of any kind) for two weeks.  When reinitiating intercourse, if it is uncomfortable, stop and make an appointment with Dr Tahj Lindseth to be evaluated. ° °? No driving for one to two days until the effects of anesthesia has worn off.  No traveling out of town for several days. ° °? You may shower, but no baths for one week.  Walking up and down stairs is ok.  No heavy lifting, prolonged standing, repeated bending or any “working out” until your post op check. ° °? Rest frequently, listen to your body and do not push yourself and overdo it. ° °? Call if: ° °o Your pain medication does not seem strong enough. °o Worsening pain or abdominal bloating °o Persistent nausea or vomiting °o Difficulty with urination or bowel movements. °o Temperature of 101 degrees or higher. °o Heavy vaginal bleeding.  If your period is due, you may use tampons. °o You have any questions or concerns ° ° ° °

## 2014-12-14 NOTE — Op Note (Signed)
Erika [name] 1965-12-10 [id number]   Post Operative Note   Date of surgery:  12/14/2014  Pre Op Dx:  Irregular bleeding, endometrial polyp  Post Op Dx:  Irregular bleeding, endometrial polyp  Procedure:  Hysteroscopy D&C, Myosure resection of endometrial polyp  Surgeon:  Donalynn Furlong P  Anesthesia:  General  EBL:  minimal  Distended media discrepancy:   80 cc saline  Complications:  None  Specimen:  #1 endometrial polyp #2 endometrial curetting to pathology  Findings: EUA:  External [place] vagina with rash noted bilateral groin consistent with fungal. Cervix normal. Uterus difficult to palpate but grossly normal size. Adnexa without gross masses   Hysteroscopy: Adequate noting mild arcuate pattern with right/left tubal ostia, fundus, anterior/posterior uterine surfaces, lower uterine segment and endocervical canal visualized. Small endometrial polyp noted lower posterior endometrial surface.  Procedure:  The patient was taken to the operating room, placed in the low dorsal lithotomy position, underwent general anesthesia and received a perineal/vaginal preparation with Betadine solution and the bladder was emptied with in and out Foley catheterization. The timeout was performed by the surgical team. An EUA was performed and the patient was draped in the usual fashion.  The cervix was visualized with a speculum, grasped with a single-tooth tenaculum and a paracervical block using 1% lidocaine was placed, 10 cc total. The cervix was gently dilated to admit the smaller Myosure hysteroscope and hysteroscopy was performed with findings noted above. Using the Myosure Lite resectoscopic wand, the polyp was excised to the level of the surrounding endometrium. A sharp curettage was also performed and the specimens were sent separately to pathology. Repeat hysteroscopy showed an empty cavity with good distention and no evidence of perforation. The tenaculum was removed and adequate hemostasis was  visualized at the tenaculum site and external cervical os. The speculum was removed, the patient received intraoperative Toradol and was awakened without difficulty having tolerated procedure well.     [name] MD, 10:17 AM 12/14/2014

## 2014-12-14 NOTE — H&P (Signed)
  The patient was examined.  I reviewed the proposed surgery and consent form with the patient.  The dictated history and physical is current and accurate and all questions were answered. The patient is ready to proceed with surgery and has a realistic understanding and expectation for the outcome.   [name] MD, 9:24 AM 12/14/2014

## 2014-12-15 ENCOUNTER — Encounter (HOSPITAL_COMMUNITY): Payer: Self-pay | Admitting: Gynecology

## 2014-12-15 ENCOUNTER — Telehealth: Payer: Self-pay | Admitting: *Deleted

## 2014-12-15 MED ORDER — FLUCONAZOLE 150 MG PO TABS: 150.0000 mg | ORAL_TABLET | Freq: Once | ORAL | Status: DC

## 2014-12-15 NOTE — Telephone Encounter (Signed)
Pt is post D &C 12/14/14 c/o vaginal itching, no discharge, no odor. Pt asked if she could have Rx? Please advise

## 2014-12-15 NOTE — Telephone Encounter (Signed)
Pt informed, Rx sent. 

## 2014-12-15 NOTE — Telephone Encounter (Signed)
Okay for Diflucan 150 mg 1 dose 

## 2014-12-22 ENCOUNTER — Ambulatory Visit: Payer: BC Managed Care – PPO | Admitting: *Deleted

## 2014-12-24 ENCOUNTER — Encounter: Payer: Self-pay | Admitting: Gynecology

## 2014-12-24 ENCOUNTER — Ambulatory Visit (INDEPENDENT_AMBULATORY_CARE_PROVIDER_SITE_OTHER): Payer: BC Managed Care – PPO | Admitting: Gynecology

## 2014-12-24 VITALS — BP 130/84

## 2014-12-24 DIAGNOSIS — Z9889 Other specified postprocedural states: Secondary | ICD-10-CM

## 2014-12-24 NOTE — Progress Notes (Signed)
Erika [name] 21-Sep-1965 [id number]        49 y.o.  G0P0 Presents for her postoperative visit status post hysteroscopic resection of endometrial polyp. Doing well without complaints.  Past medical history,surgical history, problem list, medications, allergies, family history and social history were all reviewed and documented in the EPIC chart.  Directed ROS with pertinent positives and negatives documented in the history of present illness/assessment and plan.  Exam: Kim assistant Filed Vitals:   12/24/14 1024  BP: 130/84   General appearance:  Normal Abdomen soft nontender without masses guarding rebound. Umbilical hernia noted. Pelvic external BUS vagina with atrophic changes. Cervix grossly normal. Uterus difficult to palpate but no gross masses or tenderness. Adnexa without gross masses or tenderness.  Assessment/Plan:  49 y.o. G0P0 with normal postoperative visit. Reviewed benign pathology showing endometrial polyp. Will keep bleeding calendar and follow up if any issues with bleeding otherwise follow up in September when she is due for her annual exam.    [name] MD, 10:39 AM 12/24/2014

## 2014-12-24 NOTE — Patient Instructions (Signed)
Follow up in the fall for annual exam when due. Follow up sooner if any bleeding issues.

## 2015-02-16 ENCOUNTER — Other Ambulatory Visit: Payer: Self-pay | Admitting: Endocrinology

## 2015-02-16 DIAGNOSIS — E049 Nontoxic goiter, unspecified: Secondary | ICD-10-CM

## 2015-03-21 ENCOUNTER — Telehealth: Payer: Self-pay | Admitting: *Deleted

## 2015-03-21 DIAGNOSIS — R1031 Right lower quadrant pain: Secondary | ICD-10-CM

## 2015-03-21 NOTE — Telephone Encounter (Signed)
(  This is second part to the below message), pt asked if ultrasound can be order with OV, pain level 4 out of 10, unsure if she pulled muscle, was moving boxes a couple of weeks ago. Please advise

## 2015-03-21 NOTE — Telephone Encounter (Signed)
Pt called c/o right side ovary dull achy discomfort x 2 weeks now, takes tylenol which help with discomfort. Pt said she notices more so in the am because she sleeps on her right side, had D&C on 12/14/14

## 2015-03-21 NOTE — Telephone Encounter (Signed)
Will relay to claudia for her to call patient and schedule.

## 2015-03-21 NOTE — Telephone Encounter (Signed)
Okay to schedule ultrasound with office visit with me afterwards so I can examine her and see how she's doing.

## 2015-03-23 ENCOUNTER — Other Ambulatory Visit: Payer: BC Managed Care – PPO

## 2015-03-23 ENCOUNTER — Ambulatory Visit
Admission: RE | Admit: 2015-03-23 | Discharge: 2015-03-23 | Disposition: A | Discharge status: Home / Self Care | Payer: BC Managed Care – PPO | Source: Ambulatory Visit | Attending: Endocrinology | Admitting: Endocrinology

## 2015-03-23 DIAGNOSIS — E049 Nontoxic goiter, unspecified: Secondary | ICD-10-CM

## 2015-04-13 ENCOUNTER — Ambulatory Visit (INDEPENDENT_AMBULATORY_CARE_PROVIDER_SITE_OTHER): Payer: BC Managed Care – PPO

## 2015-04-13 ENCOUNTER — Ambulatory Visit (INDEPENDENT_AMBULATORY_CARE_PROVIDER_SITE_OTHER): Payer: BC Managed Care – PPO | Admitting: Gynecology

## 2015-04-13 ENCOUNTER — Telehealth: Payer: Self-pay | Admitting: *Deleted

## 2015-04-13 ENCOUNTER — Other Ambulatory Visit: Payer: Self-pay | Admitting: Gynecology

## 2015-04-13 ENCOUNTER — Encounter: Payer: Self-pay | Admitting: Gynecology

## 2015-04-13 VITALS — BP 124/80

## 2015-04-13 DIAGNOSIS — M545 Low back pain, unspecified: Secondary | ICD-10-CM

## 2015-04-13 DIAGNOSIS — R1011 Right upper quadrant pain: Secondary | ICD-10-CM

## 2015-04-13 DIAGNOSIS — N898 Other specified noninflammatory disorders of vagina: Secondary | ICD-10-CM

## 2015-04-13 DIAGNOSIS — K5909 Other constipation: Secondary | ICD-10-CM | POA: Diagnosis not present

## 2015-04-13 DIAGNOSIS — R1031 Right lower quadrant pain: Secondary | ICD-10-CM

## 2015-04-13 DIAGNOSIS — L298 Other pruritus: Secondary | ICD-10-CM | POA: Diagnosis not present

## 2015-04-13 DIAGNOSIS — K641 Second degree hemorrhoids: Secondary | ICD-10-CM

## 2015-04-13 LAB — URINALYSIS W MICROSCOPIC + REFLEX CULTURE
Bilirubin Urine: NEGATIVE
Casts: NONE SEEN [LPF]
Crystals: NONE SEEN [HPF]
KETONES UR: NEGATIVE
Nitrite: NEGATIVE
PH: 7 (ref 5.0–8.0)
Protein, ur: NEGATIVE
Specific Gravity, Urine: 1.015 (ref 1.001–1.035)
Yeast: NONE SEEN [HPF]

## 2015-04-13 LAB — WET PREP FOR TRICH, YEAST, CLUE
Clue Cells Wet Prep HPF POC: NONE SEEN
Trich, Wet Prep: NONE SEEN
YEAST WET PREP: NONE SEEN

## 2015-04-13 MED ORDER — CIPROFLOXACIN HCL 250 MG PO TABS: 250.0000 mg | ORAL_TABLET | Freq: Two times a day (BID) | ORAL | Status: DC

## 2015-04-13 NOTE — Patient Instructions (Signed)
Follow up for annual exam as you schedule.

## 2015-04-13 NOTE — Telephone Encounter (Signed)
Pt has ultrasound scheduled for today left message in triage that she may have yeast infection as well and wanted to have this checked. I called pt back and left on her voicemail that per Dr.Fontaine note on 03/21/15 he was going to examine her at this visit as well.

## 2015-04-13 NOTE — Progress Notes (Signed)
Erika [name] 09-Aug-1965 [id number]        49 y.o.  G0P0 presents with several issues:  1. Right-sided pain. Patient called was experiencing right-sided pain and thought that it is ovarian in origin and asked about scheduling of ultrasound at the same time as her appointment. Notes a nagging daily pain for the last several weeks. No urinary symptoms such as dysuria frequency urgency. No fever or chills nausea vomiting. Patient is having constipation having not gone for several days and ultimately used multiple laxatives with her first successful bowel movement yesterday. Still is having some right-sided pain which seems more flank the back pain at this point. She actually was planning a urology appointment in follow up of this to rule out kidney stone. 2. Vaginal irritation with some itching over the last several days. No discharge odor or urinary symptoms. 3. Hemorrhoids noticed most prominently over the last 2 days secondary to her constipation and bowel movement. Has bought over-the-counter Preparation H and started using this. No bleeding but just discomfort.  Past medical history,surgical history, problem list, medications, allergies, family history and social history were all reviewed and documented in the EPIC chart.  Directed ROS with pertinent positives and negatives documented in the history of present illness/assessment and plan.  Exam: Kim assistant Filed Vitals:   04/13/15 1555  BP: 124/80   General appearance:  Normal Spine straight with right sided flank type discomfort, nonacute with percussion. Abdomen soft nontender without masses guarding rebound Pelvic external BUS vagina grossly normal.  Cervix normal. Uterus difficult to palpate but no gross masses or tenderness. Adnexa without masses or tenderness. Anal exam shows external hemorrhoid no evidence of fissure or bleeding.  Assessment/Plan:  49 y.o. G0P0 with:  1. Right-sided pain. Ultrasound today shows uterus normal size  and echotexture. 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 Endometrial echo of 3.6 mm. Right ovary grossly normal. Left ovary with thin-walled physiologic appearing cyst 21 mm. Cul-de-sac negative. Urinalysis with 40-60 WBC 20-40 RBC few bacteria 6-10 squamous cells. Possible contaminated versus UTI contributory to her right-sided pain. Will cover with ciprofloxacin 250 mg twice a day 7 days. Patient will follow up with urology as she is already arranged and they'll recheck urinalysis after treatment. 2. Vaginal itching. Wet preps negative exam without significant discharge. Will await response to ciprofloxacin see if persists. Will follow up if it does. 3. External hemorrhoids. Patient using Preparation H. Offered rectal gel or AnaMantle HC options. Patients that interested due to cost and says she'll prefer just to stay with Preparation H. She'll follow up if the symptoms do not resolve over time. Patient's overdue for annual exam she's also going to schedule that with me.    [name] MD, 5:05 PM 04/13/2015

## 2015-04-15 LAB — URINE CULTURE

## 2015-04-22 ENCOUNTER — Other Ambulatory Visit: Payer: Self-pay

## 2015-04-22 DIAGNOSIS — Z1231 Encounter for screening mammogram for malignant neoplasm of breast: Secondary | ICD-10-CM

## 2015-04-28 ENCOUNTER — Telehealth: Payer: Self-pay | Admitting: *Deleted

## 2015-04-28 NOTE — Telephone Encounter (Signed)
Pt has appointment with Dr.Grapey on 05/09/15 @ 3:00pm asked if urine results be faxed,this was done pt aware.

## 2015-05-13 ENCOUNTER — Ambulatory Visit: Payer: BC Managed Care – PPO

## 2015-06-01 ENCOUNTER — Ambulatory Visit: Payer: BC Managed Care – PPO

## 2015-06-20 ENCOUNTER — Ambulatory Visit: Payer: BC Managed Care – PPO

## 2015-06-27 ENCOUNTER — Encounter: Payer: BC Managed Care – PPO | Admitting: Gynecology

## 2015-06-28 ENCOUNTER — Ambulatory Visit
Admission: RE | Admit: 2015-06-28 | Discharge: 2015-06-28 | Disposition: A | Discharge status: Home / Self Care | Payer: BC Managed Care – PPO | Source: Ambulatory Visit

## 2015-06-28 DIAGNOSIS — Z1231 Encounter for screening mammogram for malignant neoplasm of breast: Secondary | ICD-10-CM

## 2015-07-08 ENCOUNTER — Encounter: Payer: Self-pay | Admitting: Pulmonary Disease

## 2015-07-08 ENCOUNTER — Ambulatory Visit (INDEPENDENT_AMBULATORY_CARE_PROVIDER_SITE_OTHER): Payer: BC Managed Care – PPO | Admitting: Pulmonary Disease

## 2015-07-08 VITALS — BP 132/82 | HR 102 | Ht 64.0 in | Wt 224.0 lb

## 2015-07-08 DIAGNOSIS — R918 Other nonspecific abnormal finding of lung field: Secondary | ICD-10-CM

## 2015-07-08 DIAGNOSIS — R0600 Dyspnea, unspecified: Secondary | ICD-10-CM

## 2015-07-08 MED ORDER — FLUTICASONE-SALMETEROL 250-50 MCG/DOSE IN AEPB: 1.0000 | INHALATION_SPRAY | Freq: Two times a day (BID) | RESPIRATORY_TRACT | Status: DC

## 2015-07-08 NOTE — Progress Notes (Signed)
Subjective:    Patient ID: Erika [name], female    DOB: 1965-08-02, 50 y.o.   MRN: [id number]  HPI Evaluation for chronic cough, abnormal CT scan.  Erika [name] is a 50 year old with past medical history as below. She has complains of chronic cough for several years. Cough is productive of clear mucus associated with shortness of breath, occasional wheezing. She was diagnosed with asthma about 5-6 years ago. She was in a steroid inhaler for a few years. She states that this helped with her problem. She also has seasonal allergies.  She got a CT of the abdomen to evaluate hematuria. There is incidental findings of multiple subcentimeter pulmonary nodules.  CT hematuria protocol 05/18/15 Several small pulmonary nodules in the lower lobes. Largest measures 6 mm. Images reviewed.  Social history: Smoked 5 cigarettes a day for 2 years in the 1980s. No alcohol, illegal drug use. She is married and lives with her husband and 2 cats. 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  Family history: Lung cancer-mother  Past Medical History  Diagnosis Date  . Fibroid   . Cervical dysplasia   . PCOS (polycystic ovarian syndrome)   . Hypothyroid   . Goiter   . Ocular hypertension   . 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 Hyperlipidemia   . 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 Neuropathy (HCC)     located fingers and toes  . Umbilical hernia   . Diabetes mellitus without complication (HCC)     Type 2  . Seasonal allergies   . Hypertension   . Heart murmur     never had any problems  . Asthma     no inhaler, no problems x 5 years.  . Depression     no med  . Neuromuscular disorder (HCC)     neuropathy in feet and finger tips  . Arthritis     hands, neck  . Infertility, female     Current outpatient prescriptions:  .  acetaminophen (TYLENOL) 500 MG tablet, Take 500 mg by mouth every 6 (six) hours as needed for mild pain,  moderate pain or headache., Disp: , Rfl:  .  brimonidine (ALPHAGAN P) 0.1 % SOLN, Place 2 drops into both eyes 2 (two) times daily. , Disp: , Rfl:  .  FARXIGA 10 MG TABS tablet, Take 10 mg by mouth daily., Disp: , Rfl: 6 .  fenofibrate (TRICOR) 145 MG tablet, Take 145 mg by mouth daily after lunch. , Disp: , Rfl:  .  fexofenadine (ALLEGRA) 180 MG tablet, Take 180 mg by mouth every morning. , Disp: , Rfl:  .  ibuprofen (ADVIL,MOTRIN) 200 MG tablet, Take 400-600 mg by mouth at bedtime as needed for mild pain or moderate pain., Disp: , Rfl:  .  latanoprost (XALATAN) 0.005 % ophthalmic solution, Place 1 drop into both eyes at bedtime. , Disp: , Rfl:  .  levothyroxine (SYNTHROID, LEVOTHROID) 25 MCG tablet, Take 25 mcg by mouth daily before breakfast., Disp: , Rfl:  .  metFORMIN (GLUCOPHAGE) 500 MG tablet, 1,000 mg 2 (two) times daily. Takes at lunch and at bedtime, Disp: , Rfl:  .  Multiple Vitamin (MULTIVITAMIN) tablet, Take 1 tablet by mouth daily.  , Disp: , Rfl:  .  nystatin-triamcinolone ointment (MYCOLOG), Apply 1 application topically as needed (dry skin or rash). , Disp: , Rfl:  .  pseudoephedrine (SUDAFED) 30 MG tablet, Take 30  mg by mouth daily as needed for congestion., Disp: , Rfl:  .  triamterene-hydrochlorothiazide (DYAZIDE) 37.5-25 MG per capsule, TAKE ONE CAPSULE once A DAY, Disp: , Rfl:  .  Vitamin D, Cholecalciferol, 400 UNITS TABS, Take 1 tablet by mouth daily., Disp: , Rfl:  .  Fluticasone-Salmeterol (ADVAIR DISKUS) 250-50 MCG/DOSE AEPB, Inhale 1 puff into the lungs 2 (two) times daily., Disp: 7 each, Rfl: 0  Review of Systems  Constitutional: Negative for fever and unexpected weight change.  HENT: Positive for congestion, nosebleeds and postnasal drip. Negative for dental problem, ear pain, rhinorrhea, sinus pressure, sneezing, sore throat and trouble swallowing.   Eyes: Positive for itching. Negative for redness.  Respiratory: Positive for cough, shortness of breath and wheezing.  Negative for chest tightness.   Cardiovascular: Positive for palpitations and leg swelling.  Gastrointestinal: Negative.  Negative for nausea and vomiting.  Endocrine: Negative.   Genitourinary: Negative.  Negative for dysuria.  Musculoskeletal: Negative.  Negative for joint swelling.  Skin: Negative.  Negative for rash.  Allergic/Immunologic: Positive for environmental allergies.  Neurological: Positive for numbness. Negative for headaches.  Hematological: Negative.  Does not bruise/bleed easily.  Psychiatric/Behavioral: Negative.  Negative for dysphoric mood. The patient is not nervous/anxious.       Objective:   Physical Exam Blood pressure 132/82, pulse 102, height 5\' 4"  (1.626 m), weight 224 lb (101.606 kg), last menstrual period 08/20/2014, SpO2 97 %. Gen: No apparent distress Neuro: No gross focal deficits. Neck: No JVD, lymphadenopathy, thyromegaly. [id number], no wheeze, crackles. CVS: S1-S2 heard, no murmurs rubs gallops. Abdomen: Soft, positive bowel sounds. Extremities: No edema.    Assessment & Plan:  #1 Cough This may be secondary to her asthma. She has minimal smoking history so I do not suspect emphysema. She did okay with a steroid inhaler in the past. Her symptoms now are persistent and daily. I will try her on Advair 250/50. She also get pulmonary function tests.  #2 Subcentimeter pulmonary nodules I'll schedule a dedicated CT of the chest in 6 months to follow-up on the pulmonary nodules.  PLAN: - Pulmonary function test - Start Advair 250/50 - CT of the chest in 6 months  Marshell Garfinkel MD [facility] Pager [phone] If no answer or after 3pm call: [phone] 07/08/2015, 4:58 PM

## 2015-07-08 NOTE — Addendum Note (Signed)
Addended by: Lorane Gell on: 07/08/2015 05:02 PM   Modules accepted: Orders

## 2015-07-08 NOTE — Patient Instructions (Signed)
We will schedule you for lung function tests. Follow-up CT scan of the chest in 6 months. Start Advair 250/50.  Return to clinic in 3 months.

## 2015-08-11 ENCOUNTER — Encounter: Payer: BC Managed Care – PPO | Admitting: Gynecology

## 2015-09-21 ENCOUNTER — Encounter: Payer: Self-pay | Admitting: Gynecology

## 2015-09-21 ENCOUNTER — Ambulatory Visit (INDEPENDENT_AMBULATORY_CARE_PROVIDER_SITE_OTHER): Payer: BC Managed Care – PPO | Admitting: Gynecology

## 2015-09-21 VITALS — BP 124/78 | Ht 64.0 in | Wt 216.0 lb

## 2015-09-21 DIAGNOSIS — Z01419 Encounter for gynecological examination (general) (routine) without abnormal findings: Secondary | ICD-10-CM | POA: Diagnosis not present

## 2015-09-21 DIAGNOSIS — N951 Menopausal and female climacteric states: Secondary | ICD-10-CM | POA: Diagnosis not present

## 2015-09-21 NOTE — Patient Instructions (Addendum)
Schedule your colonoscopy with either:  [facility]   Address: [address], [place], [place] [address]  [phone]    or  [facility]  Address: [address], [place], [place] [address]  [phone]      You may obtain a copy of any labs that were done today by logging onto MyChart as outlined in the instructions provided with your AVS (after visit summary). The office will not call with normal lab results but certainly if there are any significant abnormalities then we will contact you.   Health Maintenance Adopting a healthy lifestyle and getting preventive care can go a long way to promote health and wellness. Talk with your health care provider about what schedule of regular examinations is right for you. This is a good chance for you to check in with your provider about disease prevention and staying healthy. In between checkups, there are plenty of things you can do on your own. Experts have done a lot of research about which lifestyle changes and preventive measures are most likely to keep you healthy. Ask your health care provider for more information. WEIGHT AND DIET  Eat a healthy diet  Be sure to include plenty of vegetables, fruits, low-fat dairy products, and lean protein.  Do not eat a lot of foods high in solid fats, added sugars, or salt.  Get regular exercise. This is one of the most important things you can do for your health.  Most adults should exercise for at least 150 minutes each week. The exercise should increase your heart rate and make you sweat (moderate-intensity exercise).  Most adults should also do strengthening exercises at least twice a week. This is in addition to the moderate-intensity exercise.  Maintain a healthy weight  Body mass index (BMI) is a measurement that can be used to identify possible weight problems. It estimates body fat based on height and weight. Your health care provider can help determine  your BMI and help you achieve or maintain a healthy weight.  For females 22 years of age and older:   A BMI below 18.5 is considered underweight.  A BMI of 18.5 to 24.9 is normal.  A BMI of 25 to 29.9 is considered overweight.  A BMI of 30 and above is considered obese.  Watch levels of cholesterol and blood lipids  You should start having your blood tested for lipids and cholesterol at 52 years of age, then have this test every 5 years.  You may need to have your cholesterol levels checked more often if:  Your lipid or cholesterol levels are high.  You are older than 52 years of age.  You are at high risk for heart disease.  CANCER SCREENING   Lung Cancer  Lung cancer screening is recommended for adults 19-51 years old who are at high risk for lung cancer because of a history of smoking.  A yearly low-dose CT scan of the lungs is recommended for people who:  Currently smoke.  Have quit within the past 15 years.  Have at least a 30-pack-year history of smoking. A pack year is smoking an average of one pack of cigarettes a day for 1 year.  Yearly screening should continue until it has been 15 years since you quit.  Yearly screening should stop if you develop a health problem that would prevent you from having lung cancer treatment.  Breast Cancer  Practice breast self-awareness. This means understanding how your breasts normally appear and  feel.  It also means doing regular breast self-exams. Let your health care provider know about any changes, no matter how small.  If you are in your 20s or 30s, you should have a clinical breast exam (CBE) by a health care provider every 1-3 years as part of a regular health exam.  If you are 35 or older, have a CBE every year. Also consider having a breast X-ray (mammogram) every year.  If you have a family history of breast cancer, talk to your health care provider about genetic screening.  If you are at high risk for breast  cancer, talk to your health care provider about having an MRI and a mammogram every year.  Breast cancer gene (BRCA) assessment is recommended for women who have family members with BRCA-related cancers. BRCA-related cancers include:  Breast.  Ovarian.  Tubal.  Peritoneal cancers.  Results of the assessment will determine the need for genetic counseling and BRCA1 and BRCA2 testing. Cervical Cancer Routine pelvic examinations to screen for cervical cancer are no longer recommended for nonpregnant women who are considered low risk for cancer of the pelvic organs (ovaries, uterus, and vagina) and who do not have symptoms. A pelvic examination may be necessary if you have symptoms including those associated with pelvic infections. Ask your health care provider if a screening pelvic exam is right for you.   The Pap test is the screening test for cervical cancer for women who are considered at risk.  If you had a hysterectomy for a problem that was not cancer or a condition that could lead to cancer, then you no longer need Pap tests.  If you are older than 65 years, and you have had normal Pap tests for the past 10 years, you no longer need to have Pap tests.  If you have had past treatment for cervical cancer or a condition that could lead to cancer, you need Pap tests and screening for cancer for at least 20 years after your treatment.  If you no longer get a Pap test, assess your risk factors if they change (such as having a new sexual partner). This can affect whether you should start being screened again.  Some women have medical problems that increase their chance of getting cervical cancer. If this is the case for you, your health care provider may recommend more frequent screening and Pap tests.  The human papillomavirus (HPV) test is another test that may be used for cervical cancer screening. The HPV test looks for the virus that can cause cell changes in the cervix. The cells  collected during the Pap test can be tested for HPV.  The HPV test can be used to screen women 72 years of age and older. Getting tested for HPV can extend the interval between normal Pap tests from three to five years.  An HPV test also should be used to screen women of any age who have unclear Pap test results.  After 52 years of age, women should have HPV testing as often as Pap tests.  Colorectal Cancer  This type of cancer can be detected and often prevented.  Routine colorectal cancer screening usually begins at 53 years of age and continues through 52 years of age.  Your health care provider may recommend screening at an earlier age if you have risk factors for colon cancer.  Your health care provider may also recommend using home test kits to check for hidden blood in the stool.  A small camera  at the end of a tube can be used to examine your colon directly (sigmoidoscopy or colonoscopy). This is done to check for the earliest forms of colorectal cancer.  Routine screening usually begins at age 40.  Direct examination of the colon should be repeated every 5-10 years through 52 years of age. However, you may need to be screened more often if early forms of precancerous polyps or small growths are found. Skin Cancer  Check your skin from head to toe regularly.  Tell your health care provider about any new moles or changes in moles, especially if there is a change in a mole's shape or color.  Also tell your health care provider if you have a mole that is larger than the size of a pencil eraser.  Always use sunscreen. Apply sunscreen liberally and repeatedly throughout the day.  Protect yourself by wearing long sleeves, pants, a wide-brimmed hat, and sunglasses whenever you are outside. HEART DISEASE, DIABETES, AND HIGH BLOOD PRESSURE   Have your blood pressure checked at least every 1-2 years. High blood pressure causes heart disease and increases the risk of stroke.  If  you are between 74 years and 72 years old, ask your health care provider if you should take aspirin to prevent strokes.  Have regular diabetes screenings. This involves taking a blood sample to check your fasting blood sugar level.  If you are at a normal weight and have a low risk for diabetes, have this test once every three years after 52 years of age.  If you are overweight and have a high risk for diabetes, consider being tested at a younger age or more often. PREVENTING INFECTION  Hepatitis B  If you have a higher risk for hepatitis B, you should be screened for this virus. You are considered at high risk for hepatitis B if:  You were born in a country where hepatitis B is common. Ask your health care provider which countries are considered high risk.  Your parents were born in a high-risk country, and you have not been immunized against hepatitis B (hepatitis B vaccine).  You have HIV or AIDS.  You use needles to inject street drugs.  You live with someone who has hepatitis B.  You have had sex with someone who has hepatitis B.  You get hemodialysis treatment.  You take certain medicines for conditions, including cancer, organ transplantation, and autoimmune conditions. Hepatitis C  Blood testing is recommended for:  Everyone born from [birth year] through 1967.  Anyone with known risk factors for hepatitis C. Sexually transmitted infections (STIs)  You should be screened for sexually transmitted infections (STIs) including gonorrhea and chlamydia if:  You are sexually active and are younger than 52 years of age.  You are older than 52 years of age and your health care provider tells you that you are at risk for this type of infection.  Your sexual activity has changed since you were last screened and you are at an increased risk for chlamydia or gonorrhea. Ask your health care provider if you are at risk.  If you do not have HIV, but are at risk, it may be recommended that  you take a prescription medicine daily to prevent HIV infection. This is called pre-exposure prophylaxis (PrEP). You are considered at risk if:  You are sexually active and do not regularly use condoms or know the HIV status of your partner(s).  You take drugs by injection.  You are sexually active with a partner  who has HIV. Talk with your health care provider about whether you are at high risk of being infected with HIV. If you choose to begin PrEP, you should first be tested for HIV. You should then be tested every 3 months for as long as you are taking PrEP.  PREGNANCY   If you are premenopausal and you may become pregnant, ask your health care provider about preconception counseling.  If you may become pregnant, take 400 to 800 micrograms (mcg) of folic acid every day.  If you want to prevent pregnancy, talk to your health care provider about birth control (contraception). OSTEOPOROSIS AND MENOPAUSE   Osteoporosis is a disease in which the bones lose minerals and strength with aging. This can result in serious bone fractures. Your risk for osteoporosis can be identified using a bone density scan.  If you are 8 years of age or older, or if you are at risk for osteoporosis and fractures, ask your health care provider if you should be screened.  Ask your health care provider whether you should take a calcium or vitamin D supplement to lower your risk for osteoporosis.  Menopause may have certain physical symptoms and risks.  Hormone replacement therapy may reduce some of these symptoms and risks. Talk to your health care provider about whether hormone replacement therapy is right for you.  HOME CARE INSTRUCTIONS   Schedule regular health, dental, and eye exams.  Stay current with your immunizations.   Do not use any tobacco products including cigarettes, chewing tobacco, or electronic cigarettes.  If you are pregnant, do not drink alcohol.  If you are breastfeeding, limit how  much and how often you drink alcohol.  Limit alcohol intake to no more than 1 drink per day for nonpregnant women. One drink equals 12 ounces of beer, 5 ounces of wine, or 1 ounces of hard liquor.  Do not use street drugs.  Do not share needles.  Ask your health care provider for help if you need support or information about quitting drugs.  Tell your health care provider if you often feel depressed.  Tell your health care provider if you have ever been abused or do not feel safe at home. Document Released: 12/11/2010 Document Revised: 10/12/2013 Document Reviewed: 04/29/2013 [facility] Patient Information 2015 [place], [place]. This information is not intended to replace advice given to you by your health care provider. Make sure you discuss any questions you have with your health care provider.

## 2015-09-21 NOTE — Progress Notes (Signed)
    Erika [name] 1965-10-14 [id number]        50 y.o.  G0P0  for annual exam.  Doing well without complaints  Past medical history,surgical history, problem list, medications, allergies, family history and social history were all reviewed and documented as reviewed in the EPIC chart.  ROS:  Performed with pertinent positives and negatives included in the history, assessment and plan.   Additional significant findings :  none   Exam: Erika [name] assistant Filed Vitals:   09/21/15 1456  BP: 124/78  Height: 5\' 4"  (1.626 m)  Weight: 216 lb (97.977 kg)   General appearance:  Normal affect, orientation and appearance. Skin: Grossly normal HEENT: Without gross lesions.  No cervical or supraclavicular adenopathy. Thyroid normal.  Lungs:  Clear without wheezing, rales or rhonchi Cardiac: RR, without RMG Abdominal:  Soft, nontender, without masses, guarding, rebound, organomegaly. Umbilical hernia noted easily reduces Breasts:  Examined lying and sitting without masses, retractions, discharge or axillary adenopathy. Pelvic:  Ext/BUS/vagina normal  Cervix normal  Uterus anteverted, normal size, shape and contour, midline and mobile nontender   Adnexa without masses or tenderness    Anus and perineum normal   Rectovaginal normal sphincter tone without palpated masses or tenderness.    Assessment/Plan:  50 y.o. G0P0 female for annual exam.   1. Postmenopausal. Patient one year without menses. Is having some hot flashes and sweats. Overall tolerable at this point. We discussed possible HRT but at this point she is not interested. Nose to call me if she does any bleeding or if her symptoms worsen to discuss HRT. 2. Umbilical hernia. Stable over years observation. Easily reduces. 3. Colonoscopy. Patient again reminded to schedule screening colonoscopy and she agrees to do so. 4. Pap smear/HPV 12/2012 normal. No Pap smear done today. No history of significant abnormal Pap smears previously.   Plan repeat Pap smear approaching 5 year interval per current screening guidelines. 5. Mammography's 06/2015. Continue with annual mammography when due.  SBE monthly reviewed 6. DEXA never. Will plan order into the menopause. Increased calcium vitamin D. 7. Health maintenance. No routine blood work done as patient has is done through her primary/endocrinology appointments. Follow up in one year, sooner as needed.   Erika Auerbach MD, 3:16 PM 09/21/2015

## 2015-09-22 LAB — URINALYSIS W MICROSCOPIC + REFLEX CULTURE
BILIRUBIN URINE: NEGATIVE
Bacteria, UA: NONE SEEN [HPF]
CRYSTALS: NONE SEEN [HPF]
Casts: NONE SEEN [LPF]
HGB URINE DIPSTICK: NEGATIVE
Ketones, ur: NEGATIVE
Leukocytes, UA: NEGATIVE
Nitrite: NEGATIVE
Protein, ur: NEGATIVE
SPECIFIC GRAVITY, URINE: 1.031 (ref 1.001–1.035)
WBC UA: NONE SEEN WBC/HPF (ref <=5)
Yeast: NONE SEEN [HPF]
pH: 5.5 (ref 5.0–8.0)

## 2015-09-23 LAB — URINE CULTURE
Colony Count: NO GROWTH
Organism ID, Bacteria: NO GROWTH

## 2015-10-12 ENCOUNTER — Ambulatory Visit (INDEPENDENT_AMBULATORY_CARE_PROVIDER_SITE_OTHER): Payer: BC Managed Care – PPO | Admitting: Pulmonary Disease

## 2015-10-12 ENCOUNTER — Encounter: Payer: Self-pay | Admitting: Pulmonary Disease

## 2015-10-12 VITALS — BP 124/84 | HR 99 | Ht 64.25 in | Wt 215.0 lb

## 2015-10-12 DIAGNOSIS — R0600 Dyspnea, unspecified: Secondary | ICD-10-CM | POA: Diagnosis not present

## 2015-10-12 DIAGNOSIS — R05 Cough: Secondary | ICD-10-CM | POA: Diagnosis not present

## 2015-10-12 DIAGNOSIS — R918 Other nonspecific abnormal finding of lung field: Secondary | ICD-10-CM

## 2015-10-12 LAB — PULMONARY FUNCTION TEST
DL/VA % pred: 130 %
DL/VA: 6.32 ml/min/mmHg/L
DLCO cor % pred: 82 %
DLCO cor: 20.26 ml/min/mmHg
DLCO unc % pred: 83 %
DLCO unc: 20.63 ml/min/mmHg
FEF 25-75 Post: 1.36 L/s
FEF 25-75 Pre: 1.64 L/s
FEF2575-%Change-Post: -17 %
FEF2575-%Pred-Post: 50 %
FEF2575-%Pred-Pre: 60 %
FEV1-%Change-Post: -3 %
FEV1-%Pred-Post: 59 %
FEV1-%Pred-Pre: 61 %
FEV1-Post: 1.65 L
FEV1-Pre: 1.71 L
FEV1FVC-%Change-Post: 0 %
FEV1FVC-%Pred-Pre: 100 %
FEV6-%Change-Post: -3 %
FEV6-%Pred-Post: 59 %
FEV6-%Pred-Pre: 61 %
FEV6-Post: 2.05 L
FEV6-Pre: 2.12 L
FEV6FVC-%Change-Post: 0 %
FEV6FVC-%Pred-Post: 102 %
FEV6FVC-%Pred-Pre: 103 %
FVC-%Change-Post: -2 %
FVC-%Pred-Post: 58 %
FVC-%Pred-Pre: 59 %
FVC-Post: 2.05 L
FVC-Pre: 2.12 L
Post FEV1/FVC ratio: 80 %
Post FEV6/FVC ratio: 100 %
Pre FEV1/FVC ratio: 81 %
Pre FEV6/FVC Ratio: 100 %

## 2015-10-12 NOTE — Patient Instructions (Addendum)
Continue using the Advair as prescribed. You have a CT scan scheduled in July for follow-up of pulmonary nodules.  Return to clinic in 3 months

## 2015-10-12 NOTE — Progress Notes (Signed)
Subjective:    Patient ID: Erika [name], female    DOB: 02/16/1966, 50 y.o.   MRN: [id number]  HPI Follow up for chronic cough, abnormal CT scan.  Erika [name] is a 50 year old with past medical history as below. She has complains of chronic cough for several years. Cough is productive of clear mucus associated with shortness of breath, occasional wheezing. She was diagnosed with asthma about 5-6 years ago. She was in a steroid inhaler for a few years. She states that this helped with her problem. She also has seasonal allergies.  She got a CT of the abdomen to evaluate hematuria. There is incidental findings of multiple subcentimeter pulmonary nodules. She was started on Advair at the last visit. She is using this only intermittently. However she noticed an improvement in her cough.  DATA: CT hematuria protocol 05/18/15 Several small pulmonary nodules in the lower lobes. Largest measures 6 mm. Images reviewed.  PFTs 10/12/15 FVC 2.12 (59%] FEV1 1.71 [1%) F/F 81 TLC 62% DLCO 83%. Minimal obstructive defect, moderate restriction.  Social history: Smoked 5 cigarettes a day for 2 years in the 1980s. No alcohol, illegal drug use. She is married and lives with her husband and 2 cats. 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  Family history: Lung cancer-mother  Past Medical History  Diagnosis Date  . Fibroid   . Cervical dysplasia   . PCOS (polycystic ovarian syndrome)   . Hypothyroid   . Goiter   . Ocular hypertension   . 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 Hyperlipidemia   . 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 Neuropathy (HCC)     located fingers and toes  . Umbilical hernia   . Diabetes mellitus without complication (HCC)     Type 2  . Seasonal allergies   . Hypertension   . Heart murmur     never had any problems  . Asthma     no inhaler, no problems x 5 years.  . Depression     no med  . Neuromuscular  disorder (HCC)     neuropathy in feet and finger tips  . Arthritis     hands, neck  . Infertility, female     Current outpatient prescriptions:  .  acetaminophen (TYLENOL) 500 MG tablet, Take 500 mg by mouth every 6 (six) hours as needed for mild pain, moderate pain or headache., Disp: , Rfl:  .  brimonidine (ALPHAGAN P) 0.1 % SOLN, Place 2 drops into both eyes 2 (two) times daily. , Disp: , Rfl:  .  cholecalciferol (VITAMIN D) 1000 units tablet, Take 1,000 Units by mouth daily., Disp: , Rfl:  .  FARXIGA 10 MG TABS tablet, Take 10 mg by mouth daily., Disp: , Rfl: 6 .  fenofibrate (TRICOR) 145 MG tablet, Take 145 mg by mouth daily after lunch. , Disp: , Rfl:  .  fexofenadine (ALLEGRA) 180 MG tablet, Take 180 mg by mouth every morning. , Disp: , Rfl:  .  Fluticasone-Salmeterol (ADVAIR DISKUS) 250-50 MCG/DOSE AEPB, Inhale 1 puff into the lungs 2 (two) times daily., Disp: 7 each, Rfl: 0 .  ibuprofen (ADVIL,MOTRIN) 200 MG tablet, Take 400-600 mg by mouth at bedtime as needed for mild pain or moderate pain., Disp: , Rfl:  .  latanoprost (XALATAN) 0.005 % ophthalmic solution, Place 1 drop into both eyes at bedtime. , Disp: , Rfl:  .  levothyroxine (SYNTHROID,  LEVOTHROID) 25 MCG tablet, Take 25 mcg by mouth daily before breakfast., Disp: , Rfl:  .  metFORMIN (GLUCOPHAGE) 500 MG tablet, 1,000 mg 2 (two) times daily. Takes at lunch and at bedtime, Disp: , Rfl:  .  Multiple Vitamin (MULTIVITAMIN) tablet, Take 1 tablet by mouth daily.  , Disp: , Rfl:  .  nystatin-triamcinolone ointment (MYCOLOG), Apply 1 application topically as needed (dry skin or rash). , Disp: , Rfl:  .  Omega-3 Fatty Acids (FISH OIL PO), Take 2,400 mg by mouth daily. , Disp: , Rfl:  .  pseudoephedrine (SUDAFED) 30 MG tablet, Take 30 mg by mouth daily as needed for congestion., Disp: , Rfl:  .  triamterene-hydrochlorothiazide (DYAZIDE) 37.5-25 MG per capsule, TAKE ONE CAPSULE once A DAY, Disp: , Rfl:   Review of Systems    Constitutional: Negative for fever and unexpected weight change.  HENT: Positive for congestion, nosebleeds and postnasal drip. Negative for dental problem, ear pain, rhinorrhea, sinus pressure, sneezing, sore throat and trouble swallowing.   Eyes: Positive for itching. Negative for redness.  Respiratory: Positive for cough, shortness of breath and wheezing. Negative for chest tightness.   Cardiovascular: Positive for palpitations and leg swelling.  Gastrointestinal: Negative.  Negative for nausea and vomiting.  Endocrine: Negative.   Genitourinary: Negative.  Negative for dysuria.  Musculoskeletal: Negative.  Negative for joint swelling.  Skin: Negative.  Negative for rash.  Allergic/Immunologic: Positive for environmental allergies.  Neurological: Positive for numbness. Negative for headaches.  Hematological: Negative.  Does not bruise/bleed easily.  Psychiatric/Behavioral: Negative.  Negative for dysphoric mood. The patient is not nervous/anxious.       Objective:   Physical Exam Blood pressure 124/84, pulse 99, height 5' 4.25" (1.632 m), weight 215 lb (97.523 kg), last menstrual period 08/20/2014, SpO2 97 %. Gen: No apparent distress Neuro: No gross focal deficits. Neck: No JVD, lymphadenopathy, thyromegaly. [id number], no wheeze, crackles. CVS: S1-S2 heard, no murmurs rubs gallops. Abdomen: Soft, positive bowel sounds. Extremities: No edema.    Assessment & Plan:  #1 Cough Secondary to her asthma. She has minimal smoking history so I do not suspect emphysema. PFTs reviewed today showed minimal obstruction. She does have moderate restriction which is likely secondary to her body habitus, obesity. There is no evidence of interstitial lung disease. She is trying to lose weight  She reports an improvement in cough and will continue on the Advair.   #2 Subcentimeter pulmonary nodules She is scheduled for a dedicated CT of the chest in 6 months to follow-up on the pulmonary  nodules.  PLAN: - Continue Advair 250/50 - CT of the chest in 6 months  Marshell Garfinkel MD [facility] Pager [phone] If no answer or after 3pm call: [phone] 10/12/2015, 4:50 PM

## 2015-10-12 NOTE — Progress Notes (Signed)
PFT done today. 

## 2015-12-23 ENCOUNTER — Ambulatory Visit (INDEPENDENT_AMBULATORY_CARE_PROVIDER_SITE_OTHER)
Admission: RE | Admit: 2015-12-23 | Discharge: 2015-12-23 | Disposition: A | Discharge status: Home / Self Care | Payer: BC Managed Care – PPO | Source: Ambulatory Visit | Attending: Pulmonary Disease | Admitting: Pulmonary Disease

## 2015-12-23 DIAGNOSIS — R918 Other nonspecific abnormal finding of lung field: Secondary | ICD-10-CM

## 2015-12-28 ENCOUNTER — Telehealth: Payer: Self-pay | Admitting: Pulmonary Disease

## 2015-12-28 DIAGNOSIS — R918 Other nonspecific abnormal finding of lung field: Secondary | ICD-10-CM

## 2015-12-28 NOTE — Telephone Encounter (Signed)
Notes Recorded by Marshell Garfinkel, MD on 12/28/2015 at 9:20 AM Please let the patient know that the CT scan shows stable nodules. We will follow with a repeat CT scan in 6 months. Please order scan.  Spoke with pt and notified of results per Dr. Vaughan Browner. Pt verbalized understanding. She states that she is concerned that there are "7 new nodules that were not there before" and wants to know why PM prefers to wait 6 months to repeat CT. She is asking if this can be done in 3 months instead, due to family hx of lung CA. She also asks, why PET is not going to be done. She looked at her results on MyChart before I spoke with her. Please advise, thanks!

## 2015-12-28 NOTE — Telephone Encounter (Signed)
I called and spoke with the patient. She is concerned about possibility of cancer. I answered all questions and concerns that she has. I explained that PET scan would not be useful as the nodules are too small.   I told her it would be OK to repeat CT scan in 3 months. Please order

## 2015-12-29 NOTE — Telephone Encounter (Signed)
Order placed per PM for repeat CT Chest Oct 2017 to follow nodules Nothing further needed; will sign off

## 2015-12-29 NOTE — Addendum Note (Signed)
Addended by: Parke Poisson E on: 12/29/2015 02:46 PM   Modules accepted: Orders

## 2016-01-23 ENCOUNTER — Ambulatory Visit (INDEPENDENT_AMBULATORY_CARE_PROVIDER_SITE_OTHER): Payer: BC Managed Care – PPO | Admitting: Pulmonary Disease

## 2016-01-23 ENCOUNTER — Other Ambulatory Visit (INDEPENDENT_AMBULATORY_CARE_PROVIDER_SITE_OTHER): Payer: BC Managed Care – PPO

## 2016-01-23 ENCOUNTER — Encounter: Payer: Self-pay | Admitting: Pulmonary Disease

## 2016-01-23 VITALS — BP 142/64 | HR 95 | Ht 64.0 in | Wt 216.8 lb

## 2016-01-23 DIAGNOSIS — R918 Other nonspecific abnormal finding of lung field: Secondary | ICD-10-CM

## 2016-01-23 LAB — SEDIMENTATION RATE: SED RATE: 45 mm/h — AB (ref 0–30)

## 2016-01-23 LAB — C-REACTIVE PROTEIN: CRP: 2.4 mg/dL (ref 0.5–20.0)

## 2016-01-23 NOTE — Progress Notes (Signed)
Subjective:    Patient ID: Erika [name], female    DOB: 1965/06/15, 50 y.o.   MRN: [id number]  HPI Follow up for chronic cough, abnormal CT scan.  Erika [name] is a 50 year old with past medical history as below. She has complains of chronic cough for several years. Cough is productive of clear mucus associated with shortness of breath, occasional wheezing. She was diagnosed with asthma about 5-6 years ago. She was in a steroid inhaler for a few years. She states that this helped with her problem. She also has seasonal allergies. She had no significant travel history or exposures.   She got a CT of the abdomen to evaluate hematuria. There is incidental findings of multiple subcentimeter pulmonary nodules. She was started on Advair at the last visit. She is using this only intermittently. However she noticed an improvement in her cough.  DATA: CT hematuria protocol 05/18/15 Several small pulmonary nodules in the lower lobes. Largest measures 6 mm. Images reviewed.  CT chest 12/23/15 Multiple bilateral pulmonary nodules, The basal pulmonary nodules are stable compared to previous CT of abdomen.  PFTs 10/12/15 FVC 2.12 (59%] FEV1 1.71 [1%) F/F 81 TLC 62% DLCO 83%. Minimal obstructive defect, moderate restriction.  Social history: Smoked 5 cigarettes a day for 2 years in the 1980s. No alcohol, illegal drug use. She is married and lives with her husband and 2 cats. 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   Family history: Lung cancer-mother  Past Medical History:  Diagnosis Date  . Arthritis    hands, neck  . Asthma    no inhaler, no problems x 5 years.  . 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 Cervical dysplasia   . Depression    no med  . Diabetes mellitus without complication (HCC)    Type 2  . 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 Fibroid   . Goiter   . Heart murmur    never had any  problems  . Hyperlipidemia   . Hypertension   . Hypothyroid   . Infertility, female   . 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 Neuropathy (HCC)    located fingers and toes  . Ocular hypertension   . PCOS (polycystic ovarian syndrome)   . Seasonal allergies   . Umbilical hernia     Current Outpatient Prescriptions:  .  acetaminophen (TYLENOL) 500 MG tablet, Take 500 mg by mouth every 6 (six) hours as needed for mild pain, moderate pain or headache., Disp: , Rfl:  .  brimonidine (ALPHAGAN P) 0.1 % SOLN, Place 2 drops into both eyes 2 (two) times daily. , Disp: , Rfl:  .  cholecalciferol (VITAMIN D) 1000 units tablet, Take 1,000 Units by mouth daily., Disp: , Rfl:  .  FARXIGA 10 MG TABS tablet, Take 10 mg by mouth daily., Disp: , Rfl: 6 .  fenofibrate (TRICOR) 145 MG tablet, Take 145 mg by mouth daily after lunch. , Disp: , Rfl:  .  fexofenadine (ALLEGRA) 180 MG tablet, Take 180 mg by mouth every morning. , Disp: , Rfl:  .  Fluticasone-Salmeterol (ADVAIR DISKUS) 250-50 MCG/DOSE AEPB, Inhale 1 puff into the lungs 2 (two) times daily., Disp: 7 each, Rfl: 0 .  ibuprofen (ADVIL,MOTRIN) 200 MG tablet, Take 400-600 mg by mouth at bedtime as needed for mild pain or moderate pain., Disp: , Rfl:  .  latanoprost (XALATAN) 0.005 % ophthalmic solution, Place 1 drop into both eyes at bedtime. , Disp: , Rfl:  .  levothyroxine (SYNTHROID, LEVOTHROID) 25 MCG tablet, Take 25 mcg by mouth daily before breakfast., Disp: , Rfl:  .  metFORMIN (GLUCOPHAGE) 500 MG tablet, 1,000 mg 2 (two) times daily. Takes at lunch and at bedtime, Disp: , Rfl:  .  Multiple Vitamin (MULTIVITAMIN) tablet, Take 1 tablet by mouth daily.  , Disp: , Rfl:  .  nystatin-triamcinolone ointment (MYCOLOG), Apply 1 application topically as needed (dry skin or rash). , Disp: , Rfl:  .  Omega-3 Fatty Acids (FISH OIL PO), Take 2,400 mg by mouth daily. , Disp: , Rfl:  .  pseudoephedrine (SUDAFED) 30 MG tablet,  Take 30 mg by mouth daily as needed for congestion., Disp: , Rfl:  .  triamterene-hydrochlorothiazide (DYAZIDE) 37.5-25 MG per capsule, TAKE ONE CAPSULE once A DAY, Disp: , Rfl:  .  vitamin E (VITAMIN E) 400 UNIT capsule, Take 400 Units by mouth daily., Disp: , Rfl:   Review of Systems  Constitutional: Negative for fever and unexpected weight change.  HENT: Negative for congestion, dental problem, ear pain, nosebleeds, postnasal drip, rhinorrhea, sinus pressure, sneezing, sore throat and trouble swallowing.   Eyes: Negative for redness and itching.  Respiratory: Positive for cough and shortness of breath. Negative for chest tightness and wheezing.   Cardiovascular: Negative for palpitations and leg swelling.  Gastrointestinal: Negative.  Negative for nausea and vomiting.  Endocrine: Negative.   Genitourinary: Negative.  Negative for dysuria.  Musculoskeletal: Negative.  Negative for joint swelling.  Skin: Negative.  Negative for rash.  Allergic/Immunologic: Negative for environmental allergies.  Neurological: Negative for numbness and headaches.  Hematological: Negative.  Does not bruise/bleed easily.  Psychiatric/Behavioral: Negative.  Negative for dysphoric mood. The patient is not nervous/anxious.       Objective:   Physical Exam Blood pressure (!) 142/64, pulse 95, height 5\' 4"  (1.626 m), weight 216 lb 12.8 oz (98.3 kg), last menstrual period 08/20/2014, SpO2 98 %. Gen: No apparent distress Neuro: No gross focal deficits. Neck: No JVD, lymphadenopathy, thyromegaly. [id number], no wheeze, crackles. CVS: S1-S2 heard, no murmurs rubs gallops. Abdomen: Soft, positive bowel sounds. Extremities: No edema.    Assessment & Plan:  #1 Cough Secondary to her asthma. She has minimal smoking history so I do not suspect emphysema. PFTs showed minimal obstruction. She does have moderate restriction which is likely secondary to her body habitus, obesity. There is no evidence of interstitial lung  disease. She is trying to lose weight  She reports an improvement in cough and will continue on the Advair.   #2 Subcentimeter pulmonary nodules Stable on repeat CT scan. She does endorse some joint symptoms today. I will get a rheumatology panel to rule out any connective tissue disease. I think a six month follow up would be reasonable to get a repeat CT as the nodules (at least the basal ones) have remained stable since Dec 17.  PLAN: - Continue Advair 250/50 - CT of the chest in 6 months - Serologies for connective tissue disease.   Marshell Garfinkel MD [facility] Pager [phone] If no answer or after 3pm call: [phone] 01/23/2016, 4:50 PM

## 2016-01-23 NOTE — Patient Instructions (Signed)
We will send blood work to check ANA, ANCA, CCP, RA, Sed rate and CRP. Follow up CT scan in 6 months.  Return after CT scan.

## 2016-01-24 LAB — ANA COMPREHENSIVE PANEL
Anti JO-1: <0.2 AI (ref 0.0–0.9)
Centromere Ab Screen: <0.2 AI (ref 0.0–0.9)
ENA RNP Ab: <0.2 AI (ref 0.0–0.9)
ENA SM Ab Ser-aCnc: <0.2 AI (ref 0.0–0.9)
ENA SSA (RO) Ab: <0.2 AI (ref 0.0–0.9)
ENA SSB (LA) Ab: <0.2 AI (ref 0.0–0.9)

## 2016-01-24 LAB — ANCA SCREEN W REFLEX TITER: ANCA Screen: NEGATIVE

## 2016-01-24 LAB — RHEUMATOID FACTOR

## 2016-01-24 LAB — CYCLIC CITRUL PEPTIDE ANTIBODY, IGG

## 2016-01-27 ENCOUNTER — Telehealth: Payer: Self-pay | Admitting: Pulmonary Disease

## 2016-01-27 NOTE — Telephone Encounter (Signed)
Called and spoke with pt and she stated that she got results on mychart late last night.  She doesn't understand these results.  PM please advise of the lab results .  thanks

## 2016-01-28 NOTE — Telephone Encounter (Signed)
Please let her know that all the lab tests we did were normal. There is no evidence that a connective tissue or autoimmune disorder is causing the lung nodules. We will not change the original plan to follow up with repeat CT scan.

## 2016-01-30 NOTE — Telephone Encounter (Signed)
Spoke with pt. She is aware of results. Nothing further was needed.  

## 2016-04-12 ENCOUNTER — Encounter: Payer: Self-pay | Admitting: Pulmonary Disease

## 2016-04-12 ENCOUNTER — Ambulatory Visit
Admission: RE | Admit: 2016-04-12 | Discharge: 2016-04-12 | Disposition: A | Discharge status: Home / Self Care | Payer: BC Managed Care – PPO | Source: Ambulatory Visit | Attending: Pulmonary Disease | Admitting: Pulmonary Disease

## 2016-04-12 ENCOUNTER — Ambulatory Visit (INDEPENDENT_AMBULATORY_CARE_PROVIDER_SITE_OTHER): Payer: BC Managed Care – PPO | Admitting: Pulmonary Disease

## 2016-04-12 VITALS — BP 134/80 | HR 94 | Ht 64.0 in | Wt 212.0 lb

## 2016-04-12 DIAGNOSIS — R05 Cough: Secondary | ICD-10-CM

## 2016-04-12 DIAGNOSIS — R059 Cough, unspecified: Secondary | ICD-10-CM

## 2016-04-12 MED ORDER — FLUTICASONE-SALMETEROL 250-50 MCG/DOSE IN AEPB: 1.0000 | INHALATION_SPRAY | Freq: Two times a day (BID) | RESPIRATORY_TRACT | 5 refills | Status: DC

## 2016-04-12 NOTE — Patient Instructions (Addendum)
For the cough: You need to try to suppress your cough to allow your larynx (voice box) to heal.  For three days don't talk, laugh, sing, or clear your throat. Do everything you can to suppress the cough during this time.  Use hard candies (sugarless Jolly Ranchers) or non-mint or non-menthol containing cough drops during this time to soothe your throat.  Use a cough suppressant (Delsym or what I have prescribed you) around the clock during this time.  After three days, gradually increase the use of your voice and back off on the cough suppressants.  We will call you with the results of your chest X-ray  Take the Advair twice a day no matter how you feel  Take an over the counter antacid like pepcid twice a day

## 2016-04-12 NOTE — Progress Notes (Signed)
Subjective:    Patient ID: Erika [name], female    DOB: 1965-09-16, 50 y.o.   MRN: [id number]  HPI Chief Complaint  Patient presents with  . Acute Visit    PM pt being treated for pulm nodules presents with bronchitis X2 wks- c/o increased SOB, nonprod cough, chest tightness, runny nose, PND, feels like mucus is collecting in throat.    Cough: > has frequent coughing spells > will last 5 minutes, she will get red in her face, have chest soreness > has been occurring for the entire month of October > [facility] and she was prescribed antibiltcs and tessalon perles which make her sleepy (but help a little) > occurs sporadically, sometimes when outside, sometimes inside > taking honey will help > she feels like it starts in her throat > dyspnea only with coughing, none between > she says that exercise makes it worse (swimming in a cool pool indoors) > she had some heartburn last week, doesn't take anything for it > she says that she always has sinus problems, has been on allergra for years, sometimes pseudophed, sinus problems aren't really worse than normal. > she has been out of Advair since August  Asthma: > not wheezing now, but has been lately > has been out of Advair   Past Medical History:  Diagnosis Date  . Arthritis    hands, neck  . Asthma    no inhaler, no problems x 5 years.  . 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 Cervical dysplasia   . Depression    no med  . Diabetes mellitus without complication (HCC)    Type 2  . 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 Fibroid   . Goiter   . Heart murmur    never had any problems  . Hyperlipidemia   . Hypertension   . Hypothyroid   . Infertility, female   . 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 Neuropathy (HCC)    located fingers and toes  . Ocular hypertension   . PCOS (polycystic ovarian syndrome)   .  Seasonal allergies   . Umbilical hernia       Review of Systems  Constitutional: Negative for chills, fatigue and fever.  HENT: Positive for postnasal drip and rhinorrhea. Negative for sinus pressure.   Respiratory: Positive for cough and wheezing. Negative for shortness of breath.   Cardiovascular: Negative for chest pain, palpitations and leg swelling.       Objective:   Physical Exam  Vitals:   04/12/16 1602  BP: 134/80  Pulse: 94  SpO2: 96%  Weight: 212 lb (96.2 kg)  Height: 5\' 4"  (1.626 m)  RA  Gen: well appearing HENT: OP with small cyst on R tonsil, no exudate, TM's clear, neck supple PULM: CTA B, normal percussion CV: RRR, no mgr, trace edema GI: BS+, soft, nontender Derm: no cyanosis or rash Psyche: normal mood and affect   Records from Dr. Vaughan Browner were reviewed where he was seen for asthma and pulmonary nodules. Lab work was performed to check for autoimmune disease, very large panel was all negative   CT chest from July 2017 reviewed, she had small nodules bilaterally all less than a centimeter, no emphysema     Assessment & Plan:  Impression: Acute upper airway cough syndrome Asthma, not currently treated but also not  in exacerbation Untreated acid reflux disease Postnasal drip, chronic not an exacerbation  Discussion: Her cough is due to laryngeal irritation in the setting of untreated acid reflux and persistent cough leading to cyclical cough or classic upper airway cough syndrome. She has some postnasal drip but it's not worse than baseline that believe she does have an treated acid reflux contributing. She has asthma but it does not appear to be in exacerbation right now. She does need to go back on her Advair.  Plan summary:  For the cough: You need to try to suppress your cough to allow your larynx (voice box) to heal.  For three days don't talk, laugh, sing, or clear your throat. Do everything you can to suppress the cough during this  time.  Use hard candies (sugarless Jolly Ranchers) or non-mint or non-menthol containing cough drops during this time to soothe your throat.  Use a cough suppressant (Delsym or what I have prescribed you) around the clock during this time.  After three days, gradually increase the use of your voice and back off on the cough suppressants.  We will check a CXR  Take the Advair twice a day no matter how you feel  Take an over the counter antacid like pepcid twice a day  Call us if no improvement    Current Outpatient Prescriptions:  .  acetaminophen (TYLENOL) 500 MG tablet, Take 500 mg by mouth every 6 (six) hours as needed for mild pain, moderate pain or headache., Disp: , Rfl:  .  brimonidine (ALPHAGAN P) 0.1 % SOLN, Place 2 drops into both eyes 2 (two) times daily. , Disp: , Rfl:  .  cholecalciferol (VITAMIN D) 1000 units tablet, Take 1,000 Units by mouth daily., Disp: , Rfl:  .  FARXIGA 10 MG TABS tablet, Take 10 mg by mouth daily., Disp: , Rfl: 6 .  fenofibrate (TRICOR) 145 MG tablet, Take 145 mg by mouth daily after lunch. , Disp: , Rfl:  .  fexofenadine (ALLEGRA) 180 MG tablet, Take 180 mg by mouth every morning. , Disp: , Rfl:  .  ibuprofen (ADVIL,MOTRIN) 200 MG tablet, Take 400-600 mg by mouth at bedtime as needed for mild pain or moderate pain., Disp: , Rfl:  .  latanoprost (XALATAN) 0.005 % ophthalmic solution, Place 1 drop into both eyes at bedtime. , Disp: , Rfl:  .  levothyroxine (SYNTHROID, LEVOTHROID) 25 MCG tablet, Take 25 mcg by mouth daily before breakfast., Disp: , Rfl:  .  metFORMIN (GLUCOPHAGE) 500 MG tablet, 1,000 mg 2 (two) times daily. Takes at lunch and at bedtime, Disp: , Rfl:  .  Multiple Vitamin (MULTIVITAMIN) tablet, Take 1 tablet by mouth daily.  , Disp: , Rfl:  .  nystatin-triamcinolone ointment (MYCOLOG), Apply 1 application topically as needed (dry skin or rash). , Disp: , Rfl:  .  Omega-3 Fatty Acids (FISH OIL PO), Take 2,400 mg by mouth daily. , Disp: ,  Rfl:  .  pseudoephedrine (SUDAFED) 30 MG tablet, Take 30 mg by mouth daily as needed for congestion., Disp: , Rfl:  .  triamterene-hydrochlorothiazide (DYAZIDE) 37.5-25 MG per capsule, TAKE ONE CAPSULE once A DAY, Disp: , Rfl:  .  vitamin E (VITAMIN E) 400 UNIT capsule, Take 400 Units by mouth daily., Disp: , Rfl:  .  Fluticasone-Salmeterol (ADVAIR DISKUS) 250-50 MCG/DOSE AEPB, Inhale 1 puff into the lungs 2 (two) times daily. (Patient not taking: Reported on 04/12/2016), Disp: 7 each, Rfl: 0

## 2016-04-13 ENCOUNTER — Ambulatory Visit (INDEPENDENT_AMBULATORY_CARE_PROVIDER_SITE_OTHER)
Admission: RE | Admit: 2016-04-13 | Discharge: 2016-04-13 | Disposition: A | Discharge status: Home / Self Care | Payer: BC Managed Care – PPO | Source: Ambulatory Visit | Attending: Pulmonary Disease | Admitting: Pulmonary Disease

## 2016-04-13 DIAGNOSIS — R05 Cough: Secondary | ICD-10-CM

## 2016-05-21 ENCOUNTER — Other Ambulatory Visit: Payer: Self-pay | Admitting: Gynecology

## 2016-05-21 DIAGNOSIS — Z1231 Encounter for screening mammogram for malignant neoplasm of breast: Secondary | ICD-10-CM

## 2016-06-01 ENCOUNTER — Encounter: Payer: Self-pay | Admitting: Gynecology

## 2016-06-01 ENCOUNTER — Ambulatory Visit (INDEPENDENT_AMBULATORY_CARE_PROVIDER_SITE_OTHER): Payer: BC Managed Care – PPO | Admitting: Gynecology

## 2016-06-01 ENCOUNTER — Other Ambulatory Visit: Payer: Self-pay | Admitting: Gynecology

## 2016-06-01 VITALS — BP 122/78

## 2016-06-01 DIAGNOSIS — B373 Candidiasis of vulva and vagina: Secondary | ICD-10-CM

## 2016-06-01 DIAGNOSIS — B3731 Acute candidiasis of vulva and vagina: Secondary | ICD-10-CM

## 2016-06-01 DIAGNOSIS — N390 Urinary tract infection, site not specified: Secondary | ICD-10-CM

## 2016-06-01 LAB — URINALYSIS W MICROSCOPIC + REFLEX CULTURE
BILIRUBIN URINE: NEGATIVE
CASTS: NONE SEEN [LPF]
Crystals: NONE SEEN [HPF]
Ketones, ur: NEGATIVE
NITRITE: NEGATIVE
SPECIFIC GRAVITY, URINE: 1.01 (ref 1.001–1.035)
Yeast: NONE SEEN [HPF]
pH: 5.5 (ref 5.0–8.0)

## 2016-06-01 LAB — WET PREP FOR TRICH, YEAST, CLUE: Trich, Wet Prep: NONE SEEN

## 2016-06-01 MED ORDER — CIPROFLOXACIN HCL 250 MG PO TABS: 250.0000 mg | ORAL_TABLET | Freq: Two times a day (BID) | ORAL | 0 refills | Status: DC

## 2016-06-01 MED ORDER — FLUCONAZOLE 150 MG PO TABS: 150.0000 mg | ORAL_TABLET | Freq: Once | ORAL | 0 refills | Status: AC

## 2016-06-01 NOTE — Progress Notes (Signed)
    Erika [name] [birth year] [id number]        50 y.o.  G0P0 with one week history of increasing lower abdominal discomfort, suprapubic pressure, low back pain, urinary frequency and dysuria. Also notes some mild vaginal itching. No discharge or odor. No fever or chills. No nausea vomiting diarrhea or significant constipation. She thought that she saw a little blood in her urine this morning.  Past medical history,surgical history, problem list, medications, allergies, family history and social history were all reviewed and documented in the EPIC chart.  Directed ROS wi.th pertinent positives and negatives documented in the history of present illness/assessment and plan.  Exam: Erika [name] assistant Vitals:   06/01/16 1439  BP: 122/78   General appearance:  Normal Spine straight with mild left CVA tenderness. Abdomen soft nontender without masses guarding rebound. Umbilical hernia noted Pelvic external BUS vagina with scant white discharge. Bimanual exam without masses or tenderness  Assessment/Plan:  Erika y.o. G0P0 with history and exam as above. Urinalysis consistent with UTI. Wet prep consistent with yeast. Will treat with Diflucan 150 mg 1 dose. Ciprofloxacin 250 mg twice a day 7 days. She's going to follow up and recheck a urinalysis in a month to make sure her hematuria clears. She has an appointment to follow up with her urologist in a month and will see them then. Follow up sooner if her symptoms persist, worsen or recur.    Erika Auerbach MD, 2:59 PM 06/01/2016

## 2016-06-01 NOTE — Addendum Note (Signed)
Addended by: Nelva Nay on: 06/01/2016 03:45 PM   Modules accepted: Orders

## 2016-06-01 NOTE — Patient Instructions (Signed)
Take the ciprofloxacin antibiotic twice daily for 7 days Take the Diflucan pill once. Follow up if your symptoms persist, worsen or recur. Follow up with the urologist as scheduled.

## 2016-06-04 LAB — URINE CULTURE

## 2016-06-08 ENCOUNTER — Telehealth: Payer: Self-pay | Admitting: *Deleted

## 2016-06-08 DIAGNOSIS — R3129 Other microscopic hematuria: Secondary | ICD-10-CM

## 2016-06-08 NOTE — Telephone Encounter (Signed)
Pt was told to come back in 1 month( office note on 06/01/16)  to repeat urine due to hematuria, order placed. Pt coming on 07/02/16 @ 4:00am

## 2016-06-25 ENCOUNTER — Ambulatory Visit (INDEPENDENT_AMBULATORY_CARE_PROVIDER_SITE_OTHER)
Admission: RE | Admit: 2016-06-25 | Discharge: 2016-06-25 | Disposition: A | Discharge status: Home / Self Care | Payer: BC Managed Care – PPO | Source: Ambulatory Visit | Attending: Pulmonary Disease | Admitting: Pulmonary Disease

## 2016-06-25 DIAGNOSIS — R918 Other nonspecific abnormal finding of lung field: Secondary | ICD-10-CM

## 2016-06-29 ENCOUNTER — Ambulatory Visit: Payer: BC Managed Care – PPO

## 2016-07-02 ENCOUNTER — Other Ambulatory Visit: Payer: BC Managed Care – PPO

## 2016-07-03 ENCOUNTER — Telehealth: Payer: Self-pay

## 2016-07-03 DIAGNOSIS — R918 Other nonspecific abnormal finding of lung field: Secondary | ICD-10-CM

## 2016-07-03 NOTE — Telephone Encounter (Signed)
PM would like pt to have repeat CT scan in July 2019. I will place future order now.

## 2016-07-04 ENCOUNTER — Other Ambulatory Visit: Payer: BC Managed Care – PPO

## 2016-07-06 ENCOUNTER — Other Ambulatory Visit: Payer: BC Managed Care – PPO

## 2016-07-06 DIAGNOSIS — R3129 Other microscopic hematuria: Secondary | ICD-10-CM

## 2016-07-07 LAB — URINALYSIS W MICROSCOPIC + REFLEX CULTURE
Bacteria, UA: NONE SEEN [HPF]
Bilirubin Urine: NEGATIVE
CASTS: NONE SEEN [LPF]
Crystals: NONE SEEN [HPF]
Hgb urine dipstick: NEGATIVE
KETONES UR: NEGATIVE
Leukocytes, UA: NEGATIVE
NITRITE: NEGATIVE
PH: 5.5 (ref 5.0–8.0)
Protein, ur: NEGATIVE
RBC / HPF: NONE SEEN RBC/HPF (ref <=2)
SPECIFIC GRAVITY, URINE: 1.038 — AB (ref 1.001–1.035)
Squamous Epithelial / LPF: NONE SEEN [HPF] (ref <=5)
WBC, UA: NONE SEEN WBC/HPF (ref <=5)
YEAST: NONE SEEN [HPF]

## 2016-07-11 ENCOUNTER — Telehealth: Payer: Self-pay | Admitting: *Deleted

## 2016-07-11 NOTE — Telephone Encounter (Signed)
Pt informed with the 07/06/16 urine results.

## 2016-07-18 ENCOUNTER — Ambulatory Visit: Payer: BC Managed Care – PPO

## 2016-08-01 ENCOUNTER — Ambulatory Visit
Admission: RE | Admit: 2016-08-01 | Discharge: 2016-08-01 | Disposition: A | Discharge status: Home / Self Care | Payer: BC Managed Care – PPO | Source: Ambulatory Visit | Attending: Gynecology | Admitting: Gynecology

## 2016-08-01 DIAGNOSIS — Z1231 Encounter for screening mammogram for malignant neoplasm of breast: Secondary | ICD-10-CM

## 2016-08-22 ENCOUNTER — Other Ambulatory Visit: Payer: Self-pay | Admitting: Endocrinology

## 2016-08-22 DIAGNOSIS — E049 Nontoxic goiter, unspecified: Secondary | ICD-10-CM

## 2016-08-29 ENCOUNTER — Other Ambulatory Visit: Payer: BC Managed Care – PPO

## 2016-09-06 ENCOUNTER — Ambulatory Visit
Admission: RE | Admit: 2016-09-06 | Discharge: 2016-09-06 | Disposition: A | Discharge status: Home / Self Care | Payer: BC Managed Care – PPO | Source: Ambulatory Visit | Attending: Endocrinology | Admitting: Endocrinology

## 2016-09-06 DIAGNOSIS — E049 Nontoxic goiter, unspecified: Secondary | ICD-10-CM

## 2016-09-24 ENCOUNTER — Encounter: Payer: BC Managed Care – PPO | Admitting: Gynecology

## 2016-10-09 ENCOUNTER — Other Ambulatory Visit: Payer: Self-pay | Admitting: Gynecology

## 2016-10-09 NOTE — Telephone Encounter (Signed)
Dr.Fontaine pt said she no longer with Dr.Balan, has appointment scheduled with Dr. Buddy Duty at the end of June. But Dr.Balan will not refill her medication for her, pt asked if you could refill until appointment at the end of June? Please advise

## 2016-10-09 NOTE — Telephone Encounter (Signed)
Okay to refill 2 months

## 2016-10-09 NOTE — Telephone Encounter (Signed)
Left message for pt to call. Pt called and left message in voicemail regarding Rx.

## 2016-10-10 NOTE — Telephone Encounter (Signed)
Rx sent 

## 2016-10-15 ENCOUNTER — Encounter: Payer: Self-pay | Admitting: Gynecology

## 2016-10-15 ENCOUNTER — Ambulatory Visit (INDEPENDENT_AMBULATORY_CARE_PROVIDER_SITE_OTHER): Payer: BC Managed Care – PPO | Admitting: Gynecology

## 2016-10-15 VITALS — BP 120/76 | Ht 64.0 in | Wt 209.0 lb

## 2016-10-15 DIAGNOSIS — Z01411 Encounter for gynecological examination (general) (routine) with abnormal findings: Secondary | ICD-10-CM | POA: Diagnosis not present

## 2016-10-15 DIAGNOSIS — R21 Rash and other nonspecific skin eruption: Secondary | ICD-10-CM

## 2016-10-15 DIAGNOSIS — K429 Umbilical hernia without obstruction or gangrene: Secondary | ICD-10-CM | POA: Diagnosis not present

## 2016-10-15 MED ORDER — NYSTATIN-TRIAMCINOLONE 100000-0.1 UNIT/GM-% EX OINT: 1.0000 "application " | TOPICAL_OINTMENT | Freq: Two times a day (BID) | CUTANEOUS | 1 refills | Status: DC

## 2016-10-15 NOTE — Patient Instructions (Signed)
Apply the cream twice daily to the rash as needed.

## 2016-10-15 NOTE — Progress Notes (Signed)
    Erika [name] 07-Jun-1966 [id number]        51 y.o.  G0P0 for annual exam.  Patient also notes a rash in her groin area. Recently returned from [place] where it was very hot and sweaty. Had used Mycolog cream in the past with good results.  Past medical history,surgical history, problem list, medications, allergies, family history and social history were all reviewed and documented as reviewed in the EPIC chart.  ROS:  Performed with pertinent positives and negatives included in the history, assessment and plan.   Additional significant findings :  None   Exam: [name] assistant Vitals:   10/15/16 1605  BP: 120/76  Weight: 209 lb (94.8 kg)  Height: 5\' 4"  (1.626 m)   Body mass index is 35.87 kg/m.  General appearance:  Normal affect, orientation and appearance. Skin: Grossly normal HEENT: Without gross lesions.  No cervical or supraclavicular adenopathy. Thyroid normal.  Lungs:  Clear without wheezing, rales or rhonchi Cardiac: RR, without RMG Abdominal:  Soft, nontender, without masses, guarding, rebound, organomegaly. Easily reducible inguinal hernia Breasts:  Examined lying and sitting without masses, retractions, discharge or axillary adenopathy. Pelvic:  Ext, BUS, Vagina: With diffuse fungal type rash in both groin areas.  Cervix: Normal.  Uterus: Anteverted, normal size, shape and contour, midline and mobile nontender   Adnexa: Without masses or tenderness    Anus and perineum: Normal   Rectovaginal: Normal sphincter tone without palpated masses or tenderness.    Assessment/Plan:  51 y.o. G0P0 female for annual exam.   1. Groin skin rash consistent with fungal. Mycolog 60 g tube prescribed. Apply twice daily as needed. Follow up if rash persists. No evidence of vaginal yeast. 2. Umbilical hernia. Stable over years observation. Patient wants to make an appointment with a general surgeon who she saw before to rediscuss possible repair and she is going to do  this. 3. Colonoscopy never. I again strongly recommended patient schedule a screening colonoscopy. 4. Pap smear/HPV 12/2012 negative. No Pap smear done today. Plan repeat Pap smear next year at 5 year interval per current screening guidelines. 5. Mammography 07/2016. Continue with annual mammography when due. SBE monthly reviewed. 6. DEXA never. Will plan further into the menopause. 7. Health maintenance. No routine blood work done as patient does this elsewhere. Follow up in one year, sooner as needed.   [name] MD, 4:31 PM 10/15/2016

## 2016-10-16 LAB — URINALYSIS W MICROSCOPIC + REFLEX CULTURE
BACTERIA UA: NONE SEEN [HPF]
BILIRUBIN URINE: NEGATIVE
CRYSTALS: NONE SEEN [HPF]
Casts: NONE SEEN [LPF]
Hgb urine dipstick: NEGATIVE
KETONES UR: NEGATIVE
Nitrite: NEGATIVE
PROTEIN: NEGATIVE
RBC / HPF: NONE SEEN RBC/HPF (ref <=2)
Specific Gravity, Urine: 1.043 — ABNORMAL HIGH (ref 1.001–1.035)
Yeast: NONE SEEN [HPF]
pH: 5.5 (ref 5.0–8.0)

## 2016-10-22 ENCOUNTER — Telehealth: Payer: Self-pay | Admitting: Gynecology

## 2016-10-22 LAB — URINE CULTURE

## 2016-10-22 MED ORDER — CIPROFLOXACIN HCL 250 MG PO TABS: 250.0000 mg | ORAL_TABLET | Freq: Two times a day (BID) | ORAL | 0 refills | Status: DC

## 2016-10-22 NOTE — Telephone Encounter (Signed)
Pt informed, Rx sent. 

## 2016-10-22 NOTE — Telephone Encounter (Signed)
Tell patient that her urine from her visit ultimately grew out some bacteria. Recommend ciprofloxacin 250 mg twice a day 3 days.

## 2016-11-18 ENCOUNTER — Other Ambulatory Visit: Payer: Self-pay | Admitting: Gynecology

## 2016-12-04 ENCOUNTER — Telehealth: Payer: Self-pay

## 2016-12-04 NOTE — Telephone Encounter (Signed)
Okay for referral reference umbilical hernia

## 2016-12-04 NOTE — Telephone Encounter (Signed)
Message sent to [facility] to handle referral.

## 2016-12-04 NOTE — Telephone Encounter (Signed)
Patient said at visit she discussed umbilical hernia and her plan to go back an see Dr. Greer Pickerel, surgeon, that she saw before. She called their office but since it was last 2014 since seen they require a new referral. She asked if you would refer her.

## 2016-12-05 ENCOUNTER — Telehealth: Payer: Self-pay | Admitting: *Deleted

## 2016-12-05 NOTE — Telephone Encounter (Signed)
Umbilical hernia. Stable over years observation. Patient wants to make an appointment with a general surgeon who she saw before to rediscuss possible repair and she is going to do this.   Referral faxed to [facility] surgery they will contact pt to schedule and fax me back with time and date.

## 2016-12-13 NOTE — Telephone Encounter (Signed)
Appointment on 12/27/16 with Dr.Wilson pt informed by [facility]

## 2016-12-25 DIAGNOSIS — E049 Nontoxic goiter, unspecified: Secondary | ICD-10-CM | POA: Insufficient documentation

## 2016-12-25 DIAGNOSIS — L720 Epidermal cyst: Secondary | ICD-10-CM | POA: Insufficient documentation

## 2016-12-25 DIAGNOSIS — K219 Gastro-esophageal reflux disease without esophagitis: Secondary | ICD-10-CM | POA: Insufficient documentation

## 2016-12-25 DIAGNOSIS — E119 Type 2 diabetes mellitus without complications: Secondary | ICD-10-CM | POA: Insufficient documentation

## 2016-12-30 ENCOUNTER — Other Ambulatory Visit: Payer: Self-pay | Admitting: Gynecology

## 2017-03-18 ENCOUNTER — Other Ambulatory Visit: Payer: Self-pay | Admitting: Gynecology

## 2017-04-17 IMAGING — CT CT CHEST W/O CM
2 of 4 series · 15 of 36 positions shown, 18 images · non-contrast
Comparison: Partial comparison to CT abdomen pelvis dated
05/18/2015

CLINICAL DATA: Evaluate pulmonary nodules on CT abdomen/pelvis

EXAM:
CT CHEST WITHOUT CONTRAST
TECHNIQUE: Multidetector CT imaging of the chest was performed following the
standard protocol without IV contrast.

[Series 2: thorax · axial · 0.71mm/px · z∈[-291,-21]mm · 12 of 159 slices shown, 15 images]
[im 12/159  mediastinal]
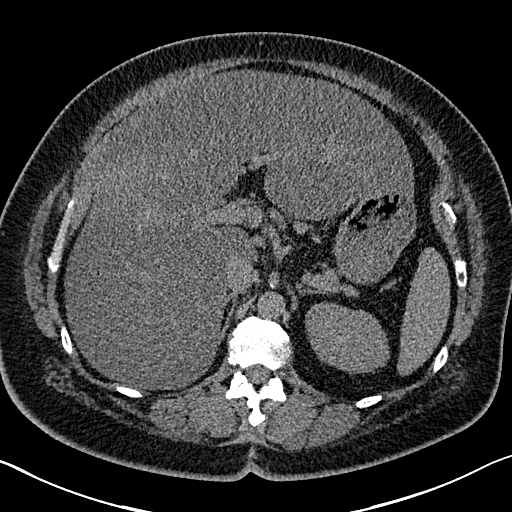
[im 12/159  lung]
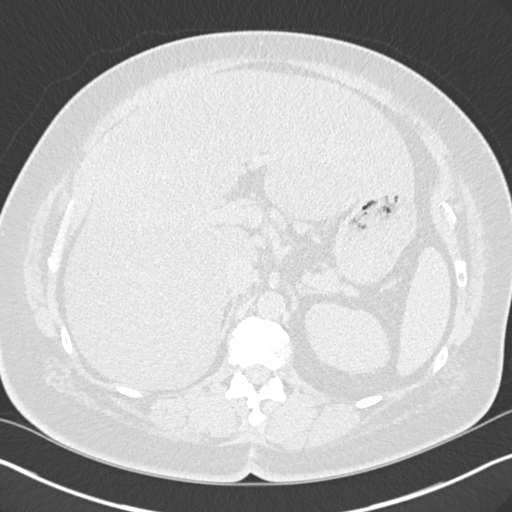
[im 23/159  lung]
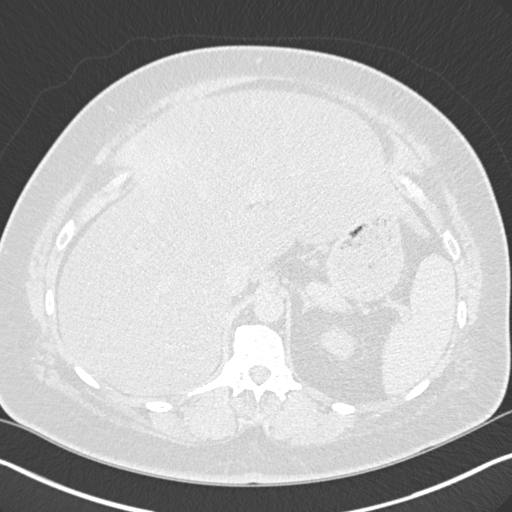
[im 34/159  lung]
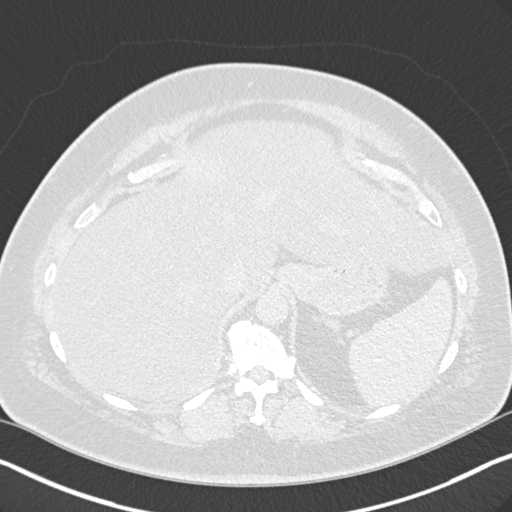
[im 46/159  lung]
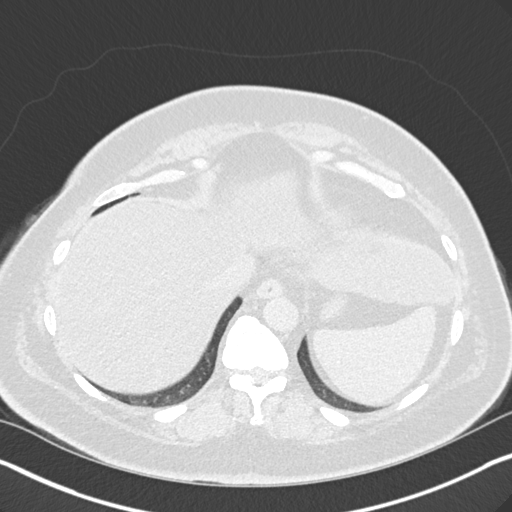
[im 57/159  mediastinal]
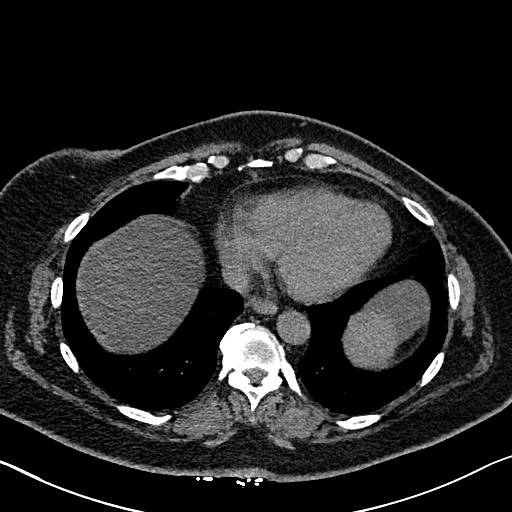
[im 57/159  lung]
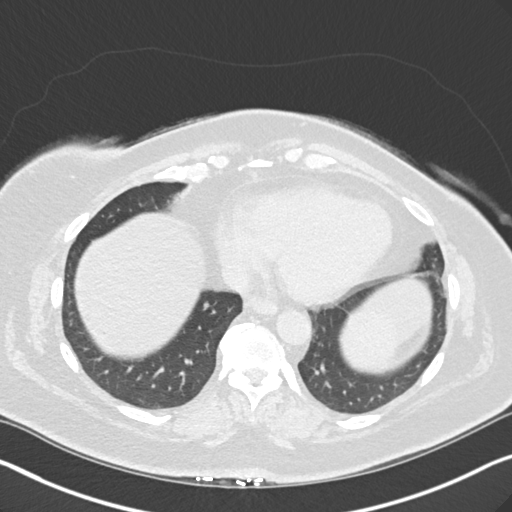
[im 68/159  lung]
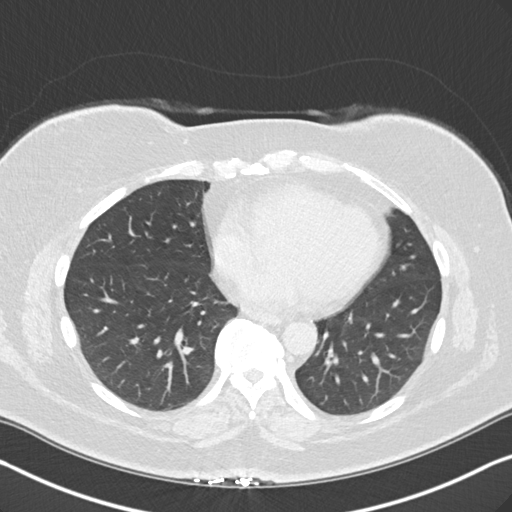
[im 91/159  lung]
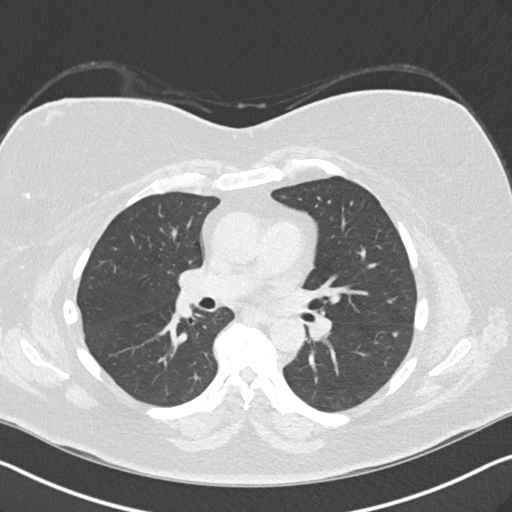
[im 102/159  lung]
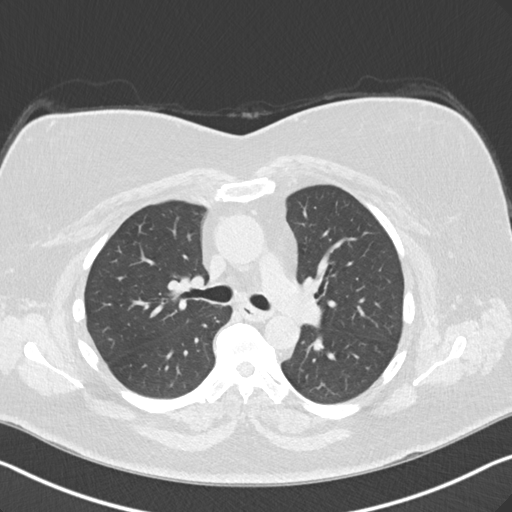
[im 113/159  mediastinal]
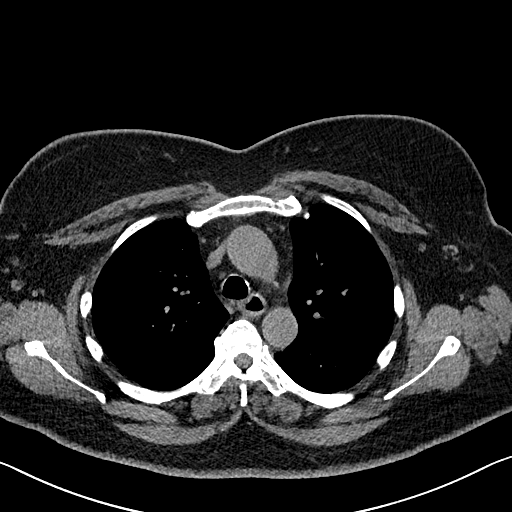
[im 113/159  lung]
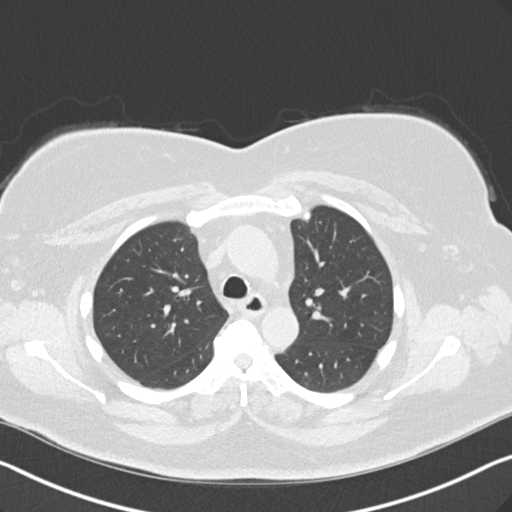
[im 125/159  lung]
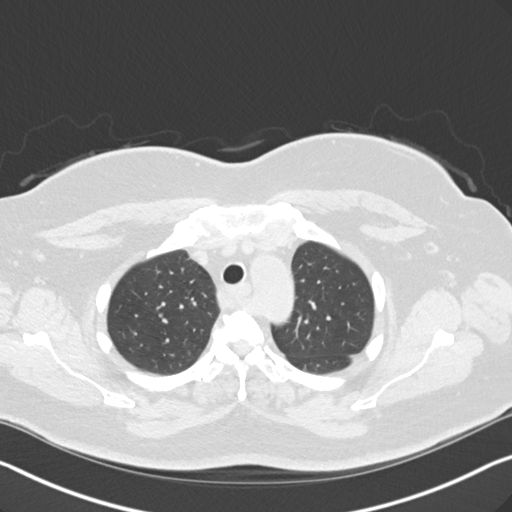
[im 136/159  lung]
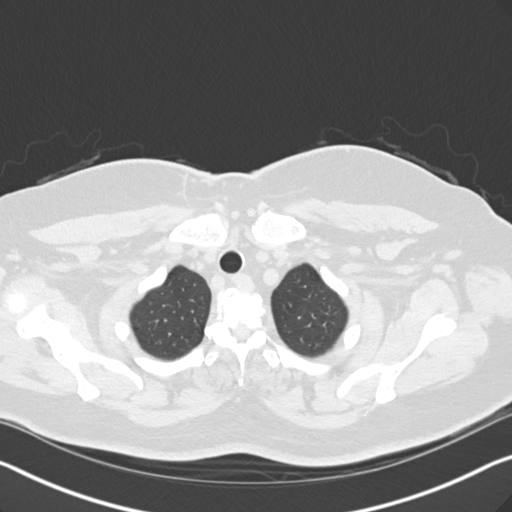
[im 147/159  lung]
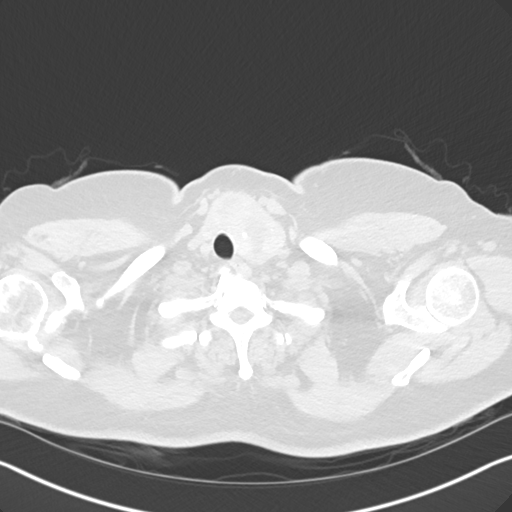

[Series 5: coronal · coronal · 0.70mm/px · 3 of 117 slices shown]
[im 24/117  lung]
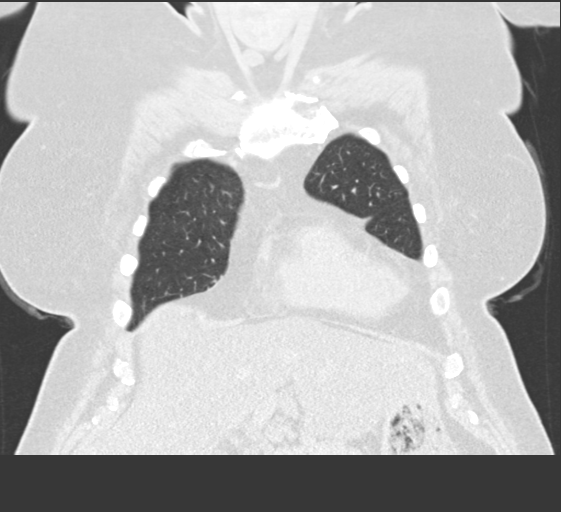
[im 47/117  lung]
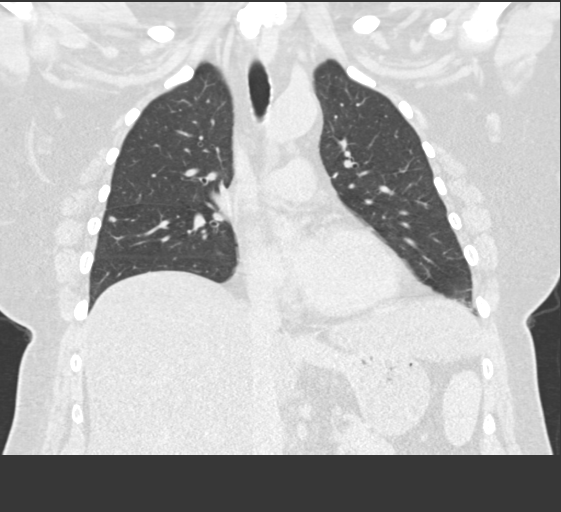
[im 70/117  lung]
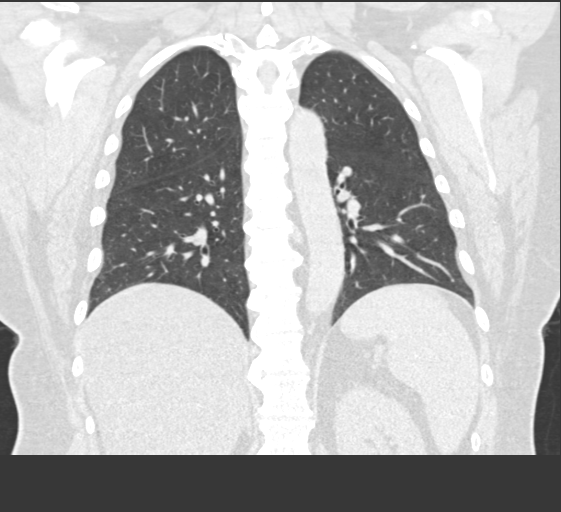

[15 of 36 positions shown; findings below may reference images not displayed]

FINDINGS: Cardiovascular: The heart is normal in size. No pericardial
effusion.

No evidence of thoracic aortic aneurysm.

Mediastinum/Nodes: Small mediastinal lymph nodes, including a 7 mm
short axis subcarinal node, within normal limits.

Thyroid is notable for multiple nodules in the isthmus and left
thyroid gland, measuring up to 3.6 cm. These have been previously
evaluated on multiple prior thyroid ultrasounds.

Lungs/Pleura: Scattered small bilateral pulmonary nodules,
including:

--4 mm right upper lobe nodule (series 3/image 36)

--8 x 7 mm left upper lobe nodule (series 3/image 73)

--5 mm nodule in the posterior right middle lobe (series 3/ image
77)

--Two 3-4 mm nodules in the right lower lobe (series 3/ image 86)

--6 mm nodule in the posterior left lower lobe (series 3/image 88)

--4 mm nodule posteriorly in the right lower lobe (series 3/image
93)

--3 mm right lower lobe nodule (series 3/image 100)

--3-4 mm nodule in the medial right lower lobe nodule (series
3/image 103)

Some of these nodules were not previously imaged, but the dominant
left lower lobe nodule is unchanged, which is reassuring. However,
metastatic disease remains a consideration.

No focal consolidation.

No pleural effusion or pneumothorax.

Upper Abdomen: Visualized upper abdomen is notable for probable
hepatic steatosis.

Musculoskeletal: Degenerative changes of the visualized
thoracolumbar spine.
IMPRESSION: Multiple small bilateral pulmonary nodules, including a dominant 8 x
7 mm nodule in the left upper lobe.

In the absence of a known primary malignancy, non-contrast chest CT
at 3-6 months is recommended. If the nodules are stable at time of
repeat CT, then future CT at 18-24 months (from today's scan) is
considered optional for low-risk patients, but is recommended for
high-risk patients.

Please note that these nodules are beneath the size threshold for
PET sensitivity at this time.

This recommendation follows the consensus statement: Guidelines for
Management of Incidental Pulmonary Nodules Detected on CT
Images:From the [HOSPITAL] 8722; published online before
print (10.1148/[id number]).

## 2017-05-21 ENCOUNTER — Other Ambulatory Visit: Payer: Self-pay | Admitting: Gynecology

## 2017-06-18 ENCOUNTER — Other Ambulatory Visit: Payer: Self-pay | Admitting: Gynecology

## 2017-06-18 DIAGNOSIS — Z1231 Encounter for screening mammogram for malignant neoplasm of breast: Secondary | ICD-10-CM

## 2017-07-08 ENCOUNTER — Other Ambulatory Visit: Payer: Self-pay | Admitting: Internal Medicine

## 2017-07-08 DIAGNOSIS — E042 Nontoxic multinodular goiter: Secondary | ICD-10-CM

## 2017-07-11 ENCOUNTER — Ambulatory Visit
Admission: RE | Admit: 2017-07-11 | Discharge: 2017-07-11 | Disposition: A | Discharge status: Home / Self Care | Payer: BC Managed Care – PPO | Source: Ambulatory Visit | Attending: Internal Medicine | Admitting: Internal Medicine

## 2017-07-11 DIAGNOSIS — E042 Nontoxic multinodular goiter: Secondary | ICD-10-CM

## 2017-08-02 ENCOUNTER — Ambulatory Visit: Payer: BC Managed Care – PPO

## 2017-08-26 ENCOUNTER — Other Ambulatory Visit: Payer: Self-pay | Admitting: Gynecology

## 2017-08-26 ENCOUNTER — Ambulatory Visit
Admission: RE | Admit: 2017-08-26 | Discharge: 2017-08-26 | Disposition: A | Discharge status: Home / Self Care | Payer: BC Managed Care – PPO | Source: Ambulatory Visit | Attending: Gynecology | Admitting: Gynecology

## 2017-08-26 DIAGNOSIS — Z1231 Encounter for screening mammogram for malignant neoplasm of breast: Secondary | ICD-10-CM

## 2017-09-23 ENCOUNTER — Ambulatory Visit: Payer: BC Managed Care – PPO | Admitting: Gynecology

## 2017-09-23 ENCOUNTER — Encounter: Payer: Self-pay | Admitting: Gynecology

## 2017-09-23 VITALS — BP 124/84

## 2017-09-23 DIAGNOSIS — N898 Other specified noninflammatory disorders of vagina: Secondary | ICD-10-CM | POA: Diagnosis not present

## 2017-09-23 DIAGNOSIS — R102 Pelvic and perineal pain: Secondary | ICD-10-CM | POA: Diagnosis not present

## 2017-09-23 LAB — WET PREP FOR TRICH, YEAST, CLUE

## 2017-09-23 MED ORDER — METRONIDAZOLE 500 MG PO TABS: 500.0000 mg | ORAL_TABLET | Freq: Two times a day (BID) | ORAL | 0 refills | Status: DC

## 2017-09-23 NOTE — Patient Instructions (Signed)
Take the metronidazole medication twice daily for 7 days.  Avoid alcohol while taking.  Work on decreasing your sugar intake and getting under better diabetic control.

## 2017-09-23 NOTE — Progress Notes (Signed)
    Erika [name] 07/29/1965 [id number]        52 y.o.  G0P0 presents with several weeks of vaginal irritation and discharge.  Had used an over-the-counter antifungal but this did not seem to help.  Most recently over the last several days has developed frequency and pelvic pressure symptoms and wonders whether she is getting a urinary tract infection.  No urgency low back pain fever or chills.  No vaginal odor.  Past medical history,surgical history, problem list, medications, allergies, family history and social history were all reviewed and documented in the EPIC chart.  Directed ROS with pertinent positives and negatives documented in the history of present illness/assessment and plan.  Exam: [name] assistant Vitals:   09/23/17 1555  BP: 124/84   General appearance:  Normal Abdomen soft nontender without masses guarding rebound Pelvic external BUS vagina with atrophic changes.  Scant discharge noted with mild vaginal erythema.  Cervix normal.  Uterus normal size midline mobile nontender.  Adnexa without masses or tenderness.  Assessment/Plan:  52 y.o. G0P0 with history and exam as above.  Urine analysis is negative excepting 3+ glucosuria.  Wet prep also was negative.  Her exam did show some vaginal erythema.  Given her symptoms with failed treatment with antifungal and going to cover her for bacterial vaginosis.  Options from treatment reviewed and she elects for metronidazole 500 mg twice daily times 7 days.  Alcohol avoidance reviewed.  I reviewed her spilling glucose in her urine and she admits to not being as tight with her sugar intake.  I think her urinary symptoms are probably a reflection of this and she is going to work on improving her diabetes.  She will follow-up with her primary physician if she continues to have an issue with glucose control.    [name] MD, 4:03 PM 09/23/2017

## 2017-09-25 LAB — URINALYSIS, COMPLETE W/RFL CULTURE
BACTERIA UA: NONE SEEN /HPF
BILIRUBIN URINE: NEGATIVE
HGB URINE DIPSTICK: NEGATIVE
Hyaline Cast: NONE SEEN /LPF
Leukocyte Esterase: NEGATIVE
NITRITES URINE, INITIAL: NEGATIVE
PROTEIN: NEGATIVE
RBC / HPF: NONE SEEN /HPF (ref 0–2)
Specific Gravity, Urine: 1.015 (ref 1.001–1.03)
WBC, UA: NONE SEEN /HPF (ref 0–5)
pH: 5.5 (ref 5.0–8.0)

## 2017-09-25 LAB — NO CULTURE INDICATED

## 2017-11-17 ENCOUNTER — Other Ambulatory Visit: Payer: Self-pay | Admitting: Gynecology

## 2017-11-18 NOTE — Telephone Encounter (Signed)
CE is scheduled 11/26/17 with Dr. TF. 

## 2017-11-26 ENCOUNTER — Ambulatory Visit: Payer: BC Managed Care – PPO | Admitting: Gynecology

## 2017-11-26 ENCOUNTER — Encounter: Payer: Self-pay | Admitting: Gynecology

## 2017-11-26 VITALS — BP 124/80 | Ht 64.0 in | Wt 210.0 lb

## 2017-11-26 DIAGNOSIS — R300 Dysuria: Secondary | ICD-10-CM | POA: Diagnosis not present

## 2017-11-26 DIAGNOSIS — N952 Postmenopausal atrophic vaginitis: Secondary | ICD-10-CM

## 2017-11-26 DIAGNOSIS — Z1151 Encounter for screening for human papillomavirus (HPV): Secondary | ICD-10-CM

## 2017-11-26 DIAGNOSIS — Z01419 Encounter for gynecological examination (general) (routine) without abnormal findings: Secondary | ICD-10-CM

## 2017-11-26 NOTE — Progress Notes (Signed)
    Erika [name] 1965-07-01 [id number]        52 y.o.  G0P0 for annual gynecologic exam.  Without gynecologic complaints.  Past medical history,surgical history, problem list, medications, allergies, family history and social history were all reviewed and documented as reviewed in the EPIC chart.  ROS:  Performed with pertinent positives and negatives included in the history, assessment and plan.   Additional significant findings : None   Exam: Erika [name] assistant Vitals:   11/26/17 1541  BP: 124/80  Weight: 210 lb (95.3 kg)  Height: 5\' 4"  (1.626 m)   Body mass index is 36.05 kg/m.  General appearance:  Normal affect, orientation and appearance. Skin: Grossly normal HEENT: Without gross lesions.  No cervical or supraclavicular adenopathy. Thyroid prominent Lungs:  Clear without wheezing, rales or rhonchi Cardiac: RR, without RMG Abdominal:  Soft, nontender, without masses, guarding, rebound, organomegaly.  Umbilical hernia easily reducible noted. Breasts:  Examined lying and sitting without masses, retractions, discharge or axillary adenopathy. Pelvic:  Ext, BUS, Vagina: With mild atrophic changes  Cervix: With atrophic changes.  Pap smear/HPV  Uterus: Anteverted, normal size, shape and contour, midline and mobile nontender   Adnexa: Without masses or tenderness    Anus and perineum: Normal   Rectovaginal: Normal sphincter tone without palpated masses or tenderness.    Assessment/Plan:  Erika [name] for annual gynecologic exam.   1. Postmenopausal/atrophic genital changes.  No significant hot flushes, night sweats, vaginal dryness or any bleeding. 2. Some mild intermittent burning with urination.  Not consistent.  No frequency urgency low back pain fever or chills.  Check urine analysis today.  Has some loss of urine with coughing.  Discussed referral to urology and the patient is not interested in considering surgery. 3. Pap smear/HPV 2014.  Pap smear/HPV today.   No history of significant abnormal Pap smears. 4. Mammography 08/2017.   continue with annual mammography when due.  Breast exam normal today. 5. Colonoscopy never.  I again strongly recommended she schedule a screening colonoscopy and she is going to call her gastroenterologist to arrange this. 6. DEXA never.  Will plan further into the menopause. 7. Umbilical hernia, easily reducible.  Stable over years observation.  Patient not interested in pursuing correction at this time. 8. Health maintenance.  Actively being seen for thyroid disease.  No blood work done today as patient does this elsewhere.  Follow-up in 1 year, sooner as needed.   Erika Auerbach MD, 4:12 PM 11/26/2017

## 2017-11-26 NOTE — Addendum Note (Signed)
Addended by: Nelva Nay on: 11/26/2017 04:39 PM   Modules accepted: Orders

## 2017-11-26 NOTE — Patient Instructions (Signed)
Schedule your colonoscopy with your gastroenterologist.  Follow-up in 1 year, sooner if any gynecologic issues.

## 2017-11-27 ENCOUNTER — Other Ambulatory Visit: Payer: Self-pay | Admitting: Pulmonary Disease

## 2017-11-27 DIAGNOSIS — R918 Other nonspecific abnormal finding of lung field: Secondary | ICD-10-CM

## 2017-11-27 LAB — URINALYSIS, COMPLETE W/RFL CULTURE
BACTERIA UA: NONE SEEN /HPF
Bilirubin Urine: NEGATIVE
Hgb urine dipstick: NEGATIVE
Hyaline Cast: NONE SEEN /LPF
Ketones, ur: NEGATIVE
Leukocyte Esterase: NEGATIVE
Nitrites, Initial: NEGATIVE
PH: 6 (ref 5.0–8.0)
Protein, ur: NEGATIVE
RBC / HPF: NONE SEEN /HPF (ref 0–2)
SPECIFIC GRAVITY, URINE: 1.034 (ref 1.001–1.03)
Squamous Epithelial / LPF: NONE SEEN /HPF (ref <=5)
WBC, UA: NONE SEEN /HPF (ref 0–5)

## 2017-11-27 LAB — NO CULTURE INDICATED

## 2017-11-28 LAB — PAP IG AND HPV HIGH-RISK: HPV DNA High Risk: NOT DETECTED

## 2017-12-23 ENCOUNTER — Other Ambulatory Visit: Payer: Self-pay | Admitting: Gynecology

## 2017-12-23 NOTE — Telephone Encounter (Signed)
Detailed message left in voice mail per [facility] access note on file. Asked her to call me.

## 2017-12-23 NOTE — Telephone Encounter (Signed)
Check with patient.  I was unaware that I was refilling her thyroid routinely.  We normally do not do blood work but she needs to have a TSH checked to make sure her thyroid dose is appropriate and if this is done elsewhere then they should be prescribing her Synthroid.

## 2017-12-24 NOTE — Telephone Encounter (Signed)
Patient called in voice mail. She said the refill should not have been sent here. She said Dr. Buddy Duty had checked her TSH and he should have been the one they contacted for refill. She said she will contact his office. Refill denied noting this.

## 2017-12-27 ENCOUNTER — Encounter: Payer: Self-pay | Admitting: Pulmonary Disease

## 2017-12-30 ENCOUNTER — Other Ambulatory Visit: Payer: BC Managed Care – PPO

## 2018-01-08 ENCOUNTER — Ambulatory Visit: Payer: BC Managed Care – PPO | Admitting: Pulmonary Disease

## 2018-05-03 ENCOUNTER — Other Ambulatory Visit: Payer: Self-pay | Admitting: Gynecology

## 2018-05-05 ENCOUNTER — Telehealth: Payer: Self-pay | Admitting: *Deleted

## 2018-05-05 MED ORDER — METFORMIN HCL ER 500 MG PO TB24: 1000.0000 mg | ORAL_TABLET | Freq: Two times a day (BID) | ORAL | 1 refills | Status: DC

## 2018-05-05 NOTE — Telephone Encounter (Signed)
Left the below on voicemail, Rx sent.

## 2018-05-05 NOTE — Telephone Encounter (Signed)
Patient requesting refill on metformin 500mg , she had annual exam in June, Rx last filled in Oct. 2018.

## 2018-05-05 NOTE — Telephone Encounter (Signed)
I think that I have refilled this in the past but I really should not be the one doing this.  This is probably better dealt with by her primary physician or endocrinologist.  As I do not see any of her blood work results it really should be managed by somebody who is monitoring her blood.  If she is currently taking this we can refill her over the next several months but I would request that she follow-up with her other physicians for ongoing refills.

## 2018-05-27 ENCOUNTER — Other Ambulatory Visit: Payer: Self-pay | Admitting: Gynecology

## 2018-06-16 ENCOUNTER — Other Ambulatory Visit: Payer: Self-pay | Admitting: Internal Medicine

## 2018-06-16 DIAGNOSIS — R1011 Right upper quadrant pain: Secondary | ICD-10-CM

## 2018-06-18 ENCOUNTER — Encounter: Payer: Self-pay | Admitting: Family Medicine

## 2018-06-18 DIAGNOSIS — E118 Type 2 diabetes mellitus with unspecified complications: Secondary | ICD-10-CM | POA: Insufficient documentation

## 2018-06-27 ENCOUNTER — Other Ambulatory Visit: Payer: Self-pay | Admitting: Gynecology

## 2018-06-30 ENCOUNTER — Ambulatory Visit
Admission: RE | Admit: 2018-06-30 | Discharge: 2018-06-30 | Disposition: A | Discharge status: Home / Self Care | Payer: BC Managed Care – PPO | Source: Ambulatory Visit | Attending: Internal Medicine | Admitting: Internal Medicine

## 2018-06-30 ENCOUNTER — Other Ambulatory Visit: Payer: Self-pay | Admitting: Gynecology

## 2018-06-30 DIAGNOSIS — R1011 Right upper quadrant pain: Secondary | ICD-10-CM

## 2018-07-01 ENCOUNTER — Other Ambulatory Visit: Payer: BC Managed Care – PPO

## 2018-07-02 ENCOUNTER — Other Ambulatory Visit: Payer: Self-pay | Admitting: Gynecology

## 2018-07-21 ENCOUNTER — Other Ambulatory Visit: Payer: Self-pay | Admitting: Internal Medicine

## 2018-07-21 DIAGNOSIS — E042 Nontoxic multinodular goiter: Secondary | ICD-10-CM

## 2018-09-08 ENCOUNTER — Other Ambulatory Visit: Payer: BC Managed Care – PPO

## 2018-09-09 ENCOUNTER — Other Ambulatory Visit: Payer: BC Managed Care – PPO

## 2018-10-28 ENCOUNTER — Other Ambulatory Visit: Payer: BC Managed Care – PPO

## 2018-11-10 ENCOUNTER — Ambulatory Visit
Admission: RE | Admit: 2018-11-10 | Discharge: 2018-11-10 | Disposition: A | Discharge status: Home / Self Care | Payer: BC Managed Care – PPO | Source: Ambulatory Visit | Attending: Internal Medicine | Admitting: Internal Medicine

## 2018-11-10 DIAGNOSIS — E042 Nontoxic multinodular goiter: Secondary | ICD-10-CM

## 2018-11-20 ENCOUNTER — Other Ambulatory Visit: Payer: BC Managed Care – PPO

## 2018-11-27 ENCOUNTER — Telehealth: Payer: Self-pay

## 2018-11-27 NOTE — Telephone Encounter (Signed)
At her last visit she said you recommended a vein specialist. She forgot the name.

## 2018-11-30 NOTE — Telephone Encounter (Signed)
The practice with Dr. Aleda Grana

## 2018-12-01 NOTE — Telephone Encounter (Signed)
Patient informed. 

## 2019-03-04 ENCOUNTER — Encounter: Payer: Self-pay | Admitting: Gynecology

## 2019-07-03 ENCOUNTER — Encounter: Payer: Self-pay | Admitting: Gastroenterology

## 2019-07-06 ENCOUNTER — Other Ambulatory Visit: Payer: Self-pay

## 2019-07-07 ENCOUNTER — Encounter: Payer: Self-pay | Admitting: Family Medicine

## 2019-07-07 ENCOUNTER — Ambulatory Visit: Payer: BC Managed Care – PPO | Admitting: Family Medicine

## 2019-07-07 VITALS — BP 151/101 | Ht 64.0 in

## 2019-07-07 DIAGNOSIS — I10 Essential (primary) hypertension: Secondary | ICD-10-CM

## 2019-07-07 DIAGNOSIS — E119 Type 2 diabetes mellitus without complications: Secondary | ICD-10-CM

## 2019-07-07 DIAGNOSIS — R05 Cough: Secondary | ICD-10-CM | POA: Diagnosis not present

## 2019-07-07 DIAGNOSIS — R0989 Other specified symptoms and signs involving the circulatory and respiratory systems: Secondary | ICD-10-CM

## 2019-07-07 DIAGNOSIS — Z794 Long term (current) use of insulin: Secondary | ICD-10-CM

## 2019-07-07 DIAGNOSIS — J989 Respiratory disorder, unspecified: Secondary | ICD-10-CM

## 2019-07-07 DIAGNOSIS — R053 Chronic cough: Secondary | ICD-10-CM

## 2019-07-07 MED ORDER — ALBUTEROL SULFATE HFA 108 (90 BASE) MCG/ACT IN AERS: 2.0000 | INHALATION_SPRAY | Freq: Four times a day (QID) | RESPIRATORY_TRACT | 1 refills | Status: AC | PRN

## 2019-07-07 MED ORDER — BUDESONIDE-FORMOTEROL FUMARATE 80-4.5 MCG/ACT IN AERO: 2.0000 | INHALATION_SPRAY | Freq: Two times a day (BID) | RESPIRATORY_TRACT | 1 refills | Status: DC

## 2019-07-07 MED ORDER — AEROCHAMBER PLUS MISC: 1 refills | Status: DC

## 2019-07-07 NOTE — Progress Notes (Signed)
Virtual Visit via Video Note   I connected with Erika [name] on 07/07/19 by a video enabled telemedicine application and verified that I am speaking with the correct person using two identifiers.  Location patient: home Location provider:work office Persons participating in the virtual visit: patient, provider  I discussed the limitations of evaluation and management by telemedicine and the availability of in person appointments. The patient expressed understanding and agreed to proceed.   HPI: Erika [name] is a 54 yo female who is establishing care today. She was here in the office but visit changed to virtual because cough.  Chronic medical problems: Asthma, OA, DM 2, and hypertension,hypothyroidism,and HLD among some. She follows with Dr.Kerr at [facility] for DM2.  She has had cough for over a year, it seems to be worse for the past year. Non productive most of the time. + Wheezing when she has coughing spells. Not sure about aggravating or alleviating factors.  + Post nasal drainage. Negative for hemoptysis.  Former smoker. According to patient, ENT recommended PPI, which she took for 3 months with no major benefit. Last evaluated by Valetta Fuller in 11/2018. She has an appointment with her pulmonologist next week.  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  HTN: Today BP was elavated. Home BP's 130's/80's. She is on Triamterene-HCZ 37.5-25 mg daily. Denies severe/frequent headache, visual changes, chest pain, dyspnea, palpitation, focal weakness, or edema.   ROS: See pertinent positives and negatives per HPI.  Past Medical History:  Diagnosis Date  . Arthritis    hands, neck  . Asthma    no inhaler, no problems x 5 years.  . 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 Cervical dysplasia   . Depression    no med  . Diabetes mellitus without complication (HCC)    Type 2  . 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 Fibroid   . Goiter   . Heart murmur    never had any problems  . Hyperlipidemia   . Hypertension   . Hypothyroid   . Infertility, female   . 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 Neuropathy    located fingers and toes  . Ocular hypertension   . PCOS (polycystic ovarian syndrome)   . Seasonal allergies   . Umbilical hernia   . Urinary incontinence     Past Surgical History:  Procedure Laterality Date  . BACK SURGERY  12/1997,01/12/1998,01/20/1998   x 3 surgeries - L4/L5/L6  . BREAST BIOPSY  12/14/2004  . BREAST SURGERY     right breast biopsy - benign  . COLPOSCOPY    . DILATATION & CURETTAGE/HYSTEROSCOPY WITH MYOSURE N/A 12/14/2014   Procedure: DILATATION & CURETTAGE/HYSTEROSCOPY WITH MYOSURE;  Surgeon: [name], MD;  Location: [facility];  Service: Gynecology;  Laterality: N/A;  to follow 2nd case 10:00?  needs one hour OR time  . DILATION AND CURETTAGE OF UTERUS  2010   endometrial polyp  . HYSTEROSCOPY  2004  . MYOMECTOMY  2004  . OVARY SURGERY  09/1998   bilateral ovarian transposition due to radiation treatment for spine lesion  . WISDOM TOOTH EXTRACTION      Family History  Problem Relation Age of Onset  . Lung cancer Mother   . Cancer Mother        Lung  . Hypertension  Maternal Aunt   . Breast cancer Maternal Aunt        Age 30  . Thyroid disease Maternal Aunt   . Cancer Maternal Aunt        colon  . Lung cancer Maternal Uncle   . Cancer Maternal Uncle        Esophageal  . Cirrhosis Maternal Aunt   . Cancer Maternal Aunt        Colon  . Cancer Brother        Throat    Social History   Socioeconomic History  . Marital status: Married    Spouse name: Not on file  . Number of children: Not on file  . Years of education: Not on file  . Highest education level: Not on file  Occupational History  . Not on file  Tobacco Use  . Smoking status: Former Smoker    Packs/day: 0.25    Years:  9.00    Pack years: 2.25    Types: Cigarettes    Quit date: 08/22/1985    Years since quitting: 33.8  . Smokeless tobacco: Never Used  Substance and Sexual Activity  . Alcohol use: Yes    Alcohol/week: 0.0 standard drinks    Comment: occasional wine  . Drug use: No  . Sexual activity: Not Currently    Birth control/protection: Post-menopausal    Comment: 1st intercourse 64 yo-5 partners  Other Topics Concern  . Not on file  Social History Narrative  . Not on file   Social Determinants of Health   Financial Resource Strain:   . Difficulty of Paying Living Expenses: Not on file  Food Insecurity:   . Worried About Charity fundraiser in the Last Year: Not on file  . Ran Out of Food in the Last Year: Not on file  Transportation Needs:   . Lack of Transportation (Medical): Not on file  . Lack of Transportation (Non-Medical): Not on file  Physical Activity:   . Days of Exercise per Week: Not on file  . Minutes of Exercise per Session: Not on file  Stress:   . Feeling of Stress : Not on file  Social Connections:   . Frequency of Communication with Friends and Family: Not on file  . Frequency of Social Gatherings with Friends and Family: Not on file  . Attends Religious Services: Not on file  . Active Member of Clubs or Organizations: Not on file  . Attends Archivist Meetings: Not on file  . Marital Status: Not on file  Intimate Partner Violence:   . Fear of Current or Ex-Partner: Not on file  . Emotionally Abused: Not on file  . Physically Abused: Not on file  . Sexually Abused: Not on file    Current Outpatient Medications:  .  atorvastatin (LIPITOR) 10 MG tablet, Take 10 mg by mouth at bedtime., Disp: , Rfl:  .  BD PEN NEEDLE NANO 2ND GEN 32G X 4 MM MISC, USE TO ADMINSTER INSULIN ONCE A DAY SUBCUTANEOUS AS DIRECTED, Disp: , Rfl:  .  brimonidine (ALPHAGAN P) 0.1 % SOLN, Place 2 drops into both eyes 2 (two) times daily. , Disp: , Rfl:  .  cholecalciferol  (VITAMIN D) 1000 units tablet, Take 1,000 Units by mouth daily., Disp: , Rfl:  .  FARXIGA 10 MG TABS tablet, Take 10 mg by mouth daily., Disp: , Rfl: 6 .  fexofenadine (ALLEGRA) 180 MG tablet, Take 180 mg by mouth every morning. , Disp: ,  Rfl:  .  Insulin Glargine (BASAGLAR KWIKPEN) 100 UNIT/ML SOPN, INJECT 80 UNITS (TITRATE AS DIRECTED, MAX DAILY DOSE OF 100 UNITS) ONCE A DAY, Disp: , Rfl:  .  levothyroxine (SYNTHROID, LEVOTHROID) 25 MCG tablet, TAKE 1 TABLET BY MOUTH EVERY DAY, Disp: 30 tablet, Rfl: 0 .  metFORMIN (GLUCOPHAGE-XR) 500 MG 24 hr tablet, Take 2 tablets (1,000 mg total) by mouth 2 (two) times daily., Disp: 120 tablet, Rfl: 1 .  triamterene-hydrochlorothiazide (MAXZIDE-25) 37.5-25 MG tablet, Take 1 tablet by mouth every morning., Disp: , Rfl:  .  albuterol (VENTOLIN HFA) 108 (90 Base) MCG/ACT inhaler, Inhale 2 puffs into the lungs every 6 (six) hours as needed for wheezing or shortness of breath., Disp: 18 g, Rfl: 1 .  budesonide-formoterol (SYMBICORT) 80-4.5 MCG/ACT inhaler, Inhale 2 puffs into the lungs 2 (two) times daily., Disp: 1 Inhaler, Rfl: 1 .  Spacer/Aero-Holding Chambers (AEROCHAMBER PLUS) inhaler, Use as instructed to use with inahaler., Disp: 1 each, Rfl: 1  EXAM:  VITALS per patient if applicable:BP (!) 123XX123   Ht 5\' 4"  (1.626 m)   LMP 08/20/2014   BMI 36.05 kg/m   GENERAL: alert, oriented, appears well and in no acute distress  HEENT: atraumatic, conjunctiva clear, no obvious abnormalities on inspection.  NECK: normal movements of the head and neck  LUNGS: on inspection no signs of respiratory distress, breathing rate appears normal, no obvious gross SOB, gasping or wheezing  CV: no obvious cyanosis  Erika: moves all visible extremities without noticeable abnormality  PSYCH/NEURO: pleasant and cooperative, no obvious depression or anxiety, speech and thought processing grossly intact  ASSESSMENT AND PLAN:  Discussed the following assessment and  plan:  Reactive airway disease that is not asthma She is not having wheezing at this time. She agrees with trying Symbicort 80-4.5 mcg 2 puffs twice daily. Albuterol inh 2 puff every 6 hours for a week then as needed for wheezing or shortness of breath.   Chronic cough We discussed possible etiologies, including allergies, asthma, COPD, and GERD among some. She reporting that PPI did not help, she can consider trying a different one if there is no improvement with inhalers.  Keep appointment with pulmonologist, 07/13/18/2021.  Diabetes mellitus type 2, insulin dependent [facility]) Following with endocrinologist.  Essential hypertension For now no changes in current management. Recommend monitoring BP daily for a few weeks, if persistently 140/90 or above, we will need to adjust treatment. Continue low-salt diet. Follow-up in 2 months.    I discussed the assessment and treatment plan with the patient. Erika Sonnek was provided an opportunity to ask questions and all were answered. She agreed with the plan and demonstrated an understanding of the instructions.    Return in about 2 months (around 09/04/2019) for cpe and f/u.    Makhiya Coburn Martinique, MD

## 2019-07-08 ENCOUNTER — Telehealth: Payer: Self-pay | Admitting: Family Medicine

## 2019-07-08 NOTE — Telephone Encounter (Signed)
Letter emailed to patient, she is aware.

## 2019-07-08 NOTE — Telephone Encounter (Signed)
Pt called in to see if Dr. Martinique is able to email her a note for work regarding not able to go to work until you are able to see your other provider (pulmonary) . Pt said they had discussed a note and the doctor wasn't sure if she was able to do so and she would check on it. Pt would like to be contacted at [phone] or If Dr. Martinique is able to email her a note, her email is [email or web address]

## 2019-07-09 ENCOUNTER — Encounter: Payer: Self-pay | Admitting: Pulmonary Disease

## 2019-07-09 ENCOUNTER — Ambulatory Visit: Payer: BC Managed Care – PPO | Admitting: Pulmonary Disease

## 2019-07-09 ENCOUNTER — Other Ambulatory Visit: Payer: Self-pay

## 2019-07-09 DIAGNOSIS — R05 Cough: Secondary | ICD-10-CM

## 2019-07-09 DIAGNOSIS — R059 Cough, unspecified: Secondary | ICD-10-CM

## 2019-07-09 NOTE — Progress Notes (Signed)
Erika [name]    GX:[id number]    07-Apr-1966  Primary Care Physician:Jordan, Malka So, MD  Referring Physician: Hayden Rasmussen, MD [place],  [place] [id number]  Chief complaint: Follow-up for chronic cough, asthma, lung nodule  HPI: 54 year old with history of asthma, chronic cough.  Diagnosed with asthma in early 2010.  Maintained on inhalers but not recently Last seen in pulmonary clinic in 2017 for lung nodules. Complains of worsening cough for the past 6 months.  Cough is nonproductive in nature.  Associated with dyspnea.  No wheeze, fevers, chills Given anti-GERD treatment with PPI with no relief  Smoked 5 cigarettes a day for 2 years in the 1980s. No alcohol, illegal drug use. She is married and lives with her husband and 2 cats. 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   Outpatient Encounter Medications as of 07/09/2019  Medication Sig  . albuterol (VENTOLIN HFA) 108 (90 Base) MCG/ACT inhaler Inhale 2 puffs into the lungs every 6 (six) hours as needed for wheezing or shortness of breath.  Marland Kitchen atorvastatin (LIPITOR) 10 MG tablet Take 10 mg by mouth at bedtime.  . BD PEN NEEDLE NANO 2ND GEN 32G X 4 MM MISC USE TO ADMINSTER INSULIN ONCE A DAY SUBCUTANEOUS AS DIRECTED  . brimonidine (ALPHAGAN P) 0.1 % SOLN Place 2 drops into both eyes 2 (two) times daily.   . cholecalciferol (VITAMIN D) 1000 units tablet Take 1,000 Units by mouth daily.  Marland Kitchen FARXIGA 10 MG TABS tablet Take 10 mg by mouth daily.  . fexofenadine (ALLEGRA) 180 MG tablet Take 180 mg by mouth every morning.   . Insulin Glargine (BASAGLAR KWIKPEN) 100 UNIT/ML SOPN INJECT 80 UNITS (TITRATE AS DIRECTED, MAX DAILY DOSE OF 100 UNITS) ONCE A DAY  . levothyroxine (SYNTHROID, LEVOTHROID) 25 MCG tablet TAKE 1 TABLET BY MOUTH EVERY DAY  . metFORMIN (GLUCOPHAGE-XR) 500 MG 24 hr tablet Take 2 tablets (1,000 mg total) by mouth 2 (two) times daily.  Marland Kitchen  Spacer/Aero-Holding Chambers (AEROCHAMBER PLUS) inhaler Use as instructed to use with inahaler.  . triamterene-hydrochlorothiazide (MAXZIDE-25) 37.5-25 MG tablet Take 1 tablet by mouth every morning.  . budesonide-formoterol (SYMBICORT) 80-4.5 MCG/ACT inhaler Inhale 2 puffs into the lungs 2 (two) times daily. (Patient not taking: Reported on 07/09/2019)   No facility-administered encounter medications on file as of 07/09/2019.    Allergies as of 07/09/2019 - Review Complete 07/09/2019  Allergen Reaction Noted  . Codeine Other (See Comments) 12/09/2018  . Latex Hives 11/10/2010  . Other Other (See Comments) 08/23/2011  . Benadryl [diphenhydramine] Rash 11/26/2017  . Nitrofurantoin monohyd macro Rash 11/10/2010  . Sulfa antibiotics Rash 11/10/2010    Past Medical History:  Diagnosis Date  . Arthritis    hands, neck  . Asthma    no inhaler, no problems x 5 years.  . 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 Cervical dysplasia   . Depression    no med  . Diabetes mellitus without complication (HCC)    Type 2  . 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 Fibroid   . Goiter   . Heart murmur    never had any problems  . Hyperlipidemia   . Hypertension   . Hypothyroid   . Infertility, female   . 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  Neuropathy    located fingers and toes  . Ocular hypertension   . PCOS (polycystic ovarian syndrome)   . Seasonal allergies   . Umbilical hernia   . Urinary incontinence     Past Surgical History:  Procedure Laterality Date  . BACK SURGERY  12/1997,01/12/1998,01/20/1998   x 3 surgeries - L4/L5/L6  . BREAST BIOPSY  12/14/2004  . BREAST SURGERY     right breast biopsy - benign  . COLPOSCOPY    . DILATATION & CURETTAGE/HYSTEROSCOPY WITH MYOSURE N/A 12/14/2014   Procedure: DILATATION & CURETTAGE/HYSTEROSCOPY WITH MYOSURE;  Surgeon: [name], MD;  Location: [facility];   Service: Gynecology;  Laterality: N/A;  to follow 2nd case 10:00?  needs one hour OR time  . DILATION AND CURETTAGE OF UTERUS  2010   endometrial polyp  . HYSTEROSCOPY  2004  . MYOMECTOMY  2004  . OVARY SURGERY  09/1998   bilateral ovarian transposition due to radiation treatment for spine lesion  . WISDOM TOOTH EXTRACTION      Family History  Problem Relation Age of Onset  . Lung cancer Mother   . Cancer Mother        Lung  . Hypertension Maternal Aunt   . Breast cancer Maternal Aunt        Age 76  . Thyroid disease Maternal Aunt   . Cancer Maternal Aunt        colon  . Lung cancer Maternal Uncle   . Cancer Maternal Uncle        Esophageal  . Cirrhosis Maternal Aunt   . Cancer Maternal Aunt        Colon  . Cancer Brother        Throat    Social History   Socioeconomic History  . Marital status: Married    Spouse name: Not on file  . Number of children: Not on file  . Years of education: Not on file  . Highest education level: Not on file  Occupational History  . Not on file  Tobacco Use  . Smoking status: Former Smoker    Packs/day: 0.25    Years: 9.00    Pack years: 2.25    Types: Cigarettes    Quit date: 08/22/1985    Years since quitting: 33.9  . Smokeless tobacco: Never Used  Substance and Sexual Activity  . Alcohol use: Yes    Alcohol/week: 0.0 standard drinks    Comment: occasional wine  . Drug use: No  . Sexual activity: Not Currently    Birth control/protection: Post-menopausal    Comment: 1st intercourse 31 yo-5 partners  Other Topics Concern  . Not on file  Social History Narrative  . Not on file   Social Determinants of Health   Financial Resource Strain:   . Difficulty of Paying Living Expenses: Not on file  Food Insecurity:   . Worried About Charity fundraiser in the Last Year: Not on file  . Ran Out of Food in the Last Year: Not on file  Transportation Needs:   . Lack of Transportation (Medical): Not on file  . Lack of  Transportation (Non-Medical): Not on file  Physical Activity:   . Days of Exercise per Week: Not on file  . Minutes of Exercise per Session: Not on file  Stress:   . Feeling of Stress : Not on file  Social Connections:   . Frequency of Communication with Friends and Family: Not on file  . Frequency of  Social Gatherings with Friends and Family: Not on file  . Attends Religious Services: Not on file  . Active Member of Clubs or Organizations: Not on file  . Attends Archivist Meetings: Not on file  . Marital Status: Not on file  Intimate Partner Violence:   . Fear of Current or Ex-Partner: Not on file  . Emotionally Abused: Not on file  . Physically Abused: Not on file  . Sexually Abused: Not on file    Review of systems: Review of Systems  Constitutional: Negative for fever and chills.  HENT: Negative.   Eyes: Negative for blurred vision.  Respiratory: as per HPI  Cardiovascular: Negative for chest pain and palpitations.  Gastrointestinal: Negative for vomiting, diarrhea, blood per rectum. Genitourinary: Negative for dysuria, urgency, frequency and hematuria.  Musculoskeletal: Negative for myalgias, back pain and joint pain.  Skin: Negative for itching and rash.  Neurological: Negative for dizziness, tremors, focal weakness, seizures and loss of consciousness.  Endo/Heme/Allergies: Negative for environmental allergies.  Psychiatric/Behavioral: Negative for depression, suicidal ideas and hallucinations.  All other systems reviewed and are negative.  Physical Exam: Blood pressure 116/70, pulse 95, temperature (!) 97.3 F (36.3 C), temperature source Temporal, height 5\' 3"  (1.6 m), weight 223 lb 12.8 oz (101.5 kg), last menstrual period 08/20/2014, SpO2 97 %. Gen:      No acute distress HEENT:  EOMI, sclera anicteric Neck:     No masses; no thyromegaly Lungs:    Clear to auscultation bilaterally; normal respiratory effort CV:         Regular rate and rhythm; no  murmurs Abd:      + bowel sounds; soft, non-tender; no palpable masses, no distension Ext:    No edema; adequate peripheral perfusion Skin:      Warm and dry; no rash Neuro: alert and oriented x 3 Psych: normal mood and affect  Data Reviewed: Imaging: CT hematuria protocol 05/18/15 Several small pulmonary nodules in the lower lobes. Largest measures 6 mm. Images reviewed.  CT chest 12/23/15 Multiple bilateral pulmonary nodules, The basal pulmonary nodules are stable compared to previous CT of abdomen.  CT chest 06/25/2016 Stable lung nodules. I have reviewed the images personally  PFTs: 10/12/15 FVC 2.12 (59%], FEV1 1.71 [1%), F/F 81, TLC 62%, DLCO 83%. Minimal obstructive defect, moderate restriction.  Labs: 01/23/2016 ANA, Denice Paradise 1, CCP, rheumatoid factor, SCL 70, Ro, La-negative  Assessment:  Chronic cough secondary to asthma, upper airway cough syndrome Symbicort has been prescribed recently but she has not started it yet.  She will pick this up and resume inhaler therapy Check BC differential, IgE  Start Flonase, chlorpheniramine antihistamine for upper airway cough  Lung nodules CT chest without contrast  Plan/Recommendations: Symbicort, albuterol as needed Check CBC, IgE Flonase, chlorphentermine for evaluate cough CT chest without contrast  Marshell Garfinkel MD [facility] 07/09/2019, 4:36 PM  CC: Hayden Rasmussen, MD

## 2019-07-09 NOTE — Patient Instructions (Addendum)
We will check labs today including CBC differential, IgE We will schedule you for high-resolution CT, start Symbicort Start Flonase Use chlorpheniramine 8 mg 3 times daily.  This is available over-the-counter. Follow-up in 2 weeks.

## 2019-07-10 ENCOUNTER — Other Ambulatory Visit: Payer: Self-pay | Admitting: General Surgery

## 2019-07-10 DIAGNOSIS — K432 Incisional hernia without obstruction or gangrene: Secondary | ICD-10-CM

## 2019-07-10 LAB — CBC WITH DIFFERENTIAL/PLATELET
Basophils Absolute: 0.1 10*3/uL (ref 0.0–0.1)
Basophils Relative: 0.6 % (ref 0.0–3.0)
Eosinophils Absolute: 0.2 10*3/uL (ref 0.0–0.7)
Eosinophils Relative: 1.5 % (ref 0.0–5.0)
HCT: 41.7 % (ref 36.0–46.0)
Hemoglobin: 13.3 g/dL (ref 12.0–15.0)
Lymphocytes Relative: 38.2 % (ref 12.0–46.0)
Lymphs Abs: 4.3 10*3/uL — ABNORMAL HIGH (ref 0.7–4.0)
MCHC: 31.9 g/dL (ref 30.0–36.0)
MCV: 80.7 fl (ref 78.0–100.0)
Monocytes Absolute: 0.5 10*3/uL (ref 0.1–1.0)
Monocytes Relative: 4.9 % (ref 3.0–12.0)
Neutro Abs: 6.1 10*3/uL (ref 1.4–7.7)
Neutrophils Relative %: 54.8 % (ref 43.0–77.0)
Platelets: 288 10*3/uL (ref 150.0–400.0)
RBC: 5.16 Mil/uL — ABNORMAL HIGH (ref 3.87–5.11)
RDW: 16.3 % — ABNORMAL HIGH (ref 11.5–15.5)
WBC: 11.1 10*3/uL — ABNORMAL HIGH (ref 4.0–10.5)

## 2019-07-10 LAB — IGE: IgE (Immunoglobulin E), Serum: 206 kU/L — ABNORMAL HIGH (ref <=114)

## 2019-07-13 NOTE — Telephone Encounter (Signed)
Dr. Vaughan Browner please advise on 1/28 lab results for patient.  Thanks!

## 2019-07-15 NOTE — Telephone Encounter (Signed)
I called and answered all questions about labs. Nothing further needed.

## 2019-07-16 NOTE — Progress Notes (Signed)
The patient is scheduled for her physical and fasting labs with [place] on 09/04/2019 at 8 AM

## 2019-07-22 ENCOUNTER — Ambulatory Visit (INDEPENDENT_AMBULATORY_CARE_PROVIDER_SITE_OTHER)
Admission: RE | Admit: 2019-07-22 | Discharge: 2019-07-22 | Disposition: A | Discharge status: Home / Self Care | Payer: BC Managed Care – PPO | Source: Ambulatory Visit | Attending: Pulmonary Disease | Admitting: Pulmonary Disease

## 2019-07-22 ENCOUNTER — Ambulatory Visit
Admission: RE | Admit: 2019-07-22 | Discharge: 2019-07-22 | Disposition: A | Discharge status: Home / Self Care | Payer: BC Managed Care – PPO | Source: Ambulatory Visit | Attending: General Surgery | Admitting: General Surgery

## 2019-07-22 ENCOUNTER — Other Ambulatory Visit: Payer: Self-pay

## 2019-07-22 DIAGNOSIS — R05 Cough: Secondary | ICD-10-CM | POA: Diagnosis not present

## 2019-07-22 DIAGNOSIS — K432 Incisional hernia without obstruction or gangrene: Secondary | ICD-10-CM

## 2019-07-22 DIAGNOSIS — R059 Cough, unspecified: Secondary | ICD-10-CM

## 2019-07-22 MED ORDER — IOPAMIDOL (ISOVUE-300) INJECTION 61%
125.0000 mL | Freq: Once | INTRAVENOUS | Status: AC | PRN
  Administered 2019-07-22: 125 mL via INTRAVENOUS

## 2019-08-04 ENCOUNTER — Telehealth: Payer: Self-pay | Admitting: Gastroenterology

## 2019-08-04 NOTE — Telephone Encounter (Signed)
Pt is scheduled for an appt with Dr. Loletha Carrow tomorrow. She would like for Dr. Loletha Carrow to review the last CT scan that she had done on 2/10 with Dr. Greer Pickerel to tell her his impressions tomorrow at her appt.

## 2019-08-04 NOTE — Telephone Encounter (Signed)
Dr. Loletha Carrow, please see message below.

## 2019-08-05 ENCOUNTER — Ambulatory Visit: Payer: BC Managed Care – PPO | Admitting: Gastroenterology

## 2019-08-05 ENCOUNTER — Encounter: Payer: Self-pay | Admitting: Gastroenterology

## 2019-08-05 ENCOUNTER — Other Ambulatory Visit: Payer: Self-pay | Admitting: Obstetrics and Gynecology

## 2019-08-05 ENCOUNTER — Other Ambulatory Visit: Payer: Self-pay

## 2019-08-05 VITALS — BP 124/70 | HR 68 | Temp 97.2°F | Ht 64.0 in | Wt 229.6 lb

## 2019-08-05 DIAGNOSIS — R053 Chronic cough: Secondary | ICD-10-CM

## 2019-08-05 DIAGNOSIS — Z1231 Encounter for screening mammogram for malignant neoplasm of breast: Secondary | ICD-10-CM

## 2019-08-05 DIAGNOSIS — R194 Change in bowel habit: Secondary | ICD-10-CM

## 2019-08-05 DIAGNOSIS — R05 Cough: Secondary | ICD-10-CM | POA: Diagnosis not present

## 2019-08-05 NOTE — Progress Notes (Signed)
[facility] Consult Note:  History: Erika [name] 08/05/2019  Referring provider: Martinique, Betty G, MD  Reason for consult/chief complaint: Cough (x around 1 year; feels that cough gets worse after eating or even after drinking water; also has asthma issues and was given an inhaler for cough; does have some throat clearing as well; doesnt really feel she has reflux as prilosec x 3 months did not seem to help) and change in bowels (alternating constipation and diarrhea; bowels seem fine over the weekend the on weekdays has issues once stressed; leafy greens cause more changes in stool)   Subjective  HPI:  This is a very pleasant 54 year old woman self-referred for altered bowel habits and chronic cough.  She has been seeing Dr. Vaughan Browner of [facility] for chronic cough, and the clinical impression is asthma and upper airway cough syndrome.  Erika [name] also saw Dr. Constance Holster of ENT in June 2020.  That office note indicates limited evaluation, cannot perform indirect laryngoscopy because patient was gagging.  Clinical impression was LPR.  She recalls being prescribed acid suppression that did not reduce the cough at all.  Erika [name] has come to the conclusion that it probably is allergic and asthmatic, and has plans to follow-up with Dr. Vaughan Browner very soon. She was not having chronic heartburn or regurgitation even prior to starting PPI.  She sometimes feels a fullness or lump in the throat after coughing episodes, and sometimes coughs when she drinks water.  She does not get esophageal dysphagia with food stuck in the chest.  She mainly wanted to see me for change in bowel habits.  For the last couple of years her bowel habits are irregular.  Much of the time she feels constipated, with passage of small stools infrequently.  But then on weekends she may have several bowel movements in a day and she cannot find any pattern to this.  It does not seem to be affected by diet.  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  Small doses of MiraLAX seem to soften the stool well, but she does not always remember to take it.  Erika [name] denies rectal bleeding, no previous colon cancer screening. Erika [name] also reports recently seeing Dr. Greer Pickerel for her abdominal wall hernia.  He ordered a CT scan, although it was done 2 weeks ago there is still no report on file.  Dr. Redmond Pulling told her hernia did not need a surgical repair.  ROS:  Review of Systems  Constitutional: Negative for appetite change and unexpected weight change.  HENT: Negative for mouth sores and voice change.   Eyes: Negative for pain and redness.  Respiratory: Positive for cough. Negative for shortness of breath.   Cardiovascular: Negative for chest pain and palpitations.  Genitourinary: Negative for dysuria and hematuria.  Musculoskeletal: Positive for arthralgias and back pain. Negative for myalgias.  Skin: Negative for pallor and rash.  Neurological: Negative for weakness and headaches.  Hematological: Negative for adenopathy.     Past Medical History: Past Medical History:  Diagnosis Date  . Arthritis    hands, neck  . Asthma    no inhaler, no problems x 5 years.  . 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 Cervical dysplasia   . Depression    no med  . Diabetes mellitus without complication ([place])  Type 2  . 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 Fibroid   . Goiter   . Heart murmur    never had any problems  . Hyperlipidemia   . Hypertension   . Hypothyroid   . Infertility, female   . 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 Neuropathy    located fingers and toes  . Ocular hypertension   . PCOS (polycystic ovarian syndrome)   . Seasonal allergies   . Umbilical hernia   . Urinary incontinence       Past Surgical History: Past Surgical History:  Procedure Laterality Date  . BACK SURGERY  12/1997,01/12/1998,01/20/1998   x 3 surgeries - L4/L5/L6  . BREAST BIOPSY Right 12/14/2004  . COLPOSCOPY    . DILATATION & CURETTAGE/HYSTEROSCOPY WITH MYOSURE N/A 12/14/2014   Procedure: DILATATION & CURETTAGE/HYSTEROSCOPY WITH MYOSURE;  Surgeon: [name], MD;  Location: [facility];  Service: Gynecology;  Laterality: N/A;  to follow 2nd case 10:00?  needs one hour OR time  . DILATION AND CURETTAGE OF UTERUS  2010   endometrial polyp  . HYSTEROSCOPY  2004  . MYOMECTOMY  2004  . OVARY SURGERY  09/1998   bilateral ovarian transposition due to radiation treatment for spine lesion  . WISDOM TOOTH EXTRACTION       Family History: Family History  Problem Relation Age of Onset  . Lung cancer Mother   . Hypertension Maternal Aunt   . Breast cancer Maternal Aunt        Age 68  . Thyroid disease Maternal Aunt   . Colon cancer Maternal Aunt   . Lung cancer Maternal Uncle   . Esophageal cancer Maternal Uncle   . Cirrhosis Maternal Aunt   . Colon cancer Maternal Aunt   . Throat cancer Brother     Social History: Social History   Socioeconomic History  . Marital status: Married    Spouse name: Not on file  . Number of children: Not on file  . Years of education: Not on file  . Highest education level: Not on file  Occupational History  . Not on file  Tobacco Use  . Smoking status: Former Smoker    Packs/day: 0.25    Years: 9.00    Pack years: 2.25    Types: Cigarettes    Quit date: 08/22/1985    Years since quitting: 33.9  . Smokeless tobacco: Never Used  Substance and Sexual Activity  . Alcohol use: Yes    Alcohol/week: 0.0 standard drinks    Comment: rare wine  . Drug use: No  . Sexual activity: Not Currently    Birth control/protection: Post-menopausal    Comment: 1st intercourse 9 yo-5 partners  Other Topics Concern  . Not on file  Social History Narrative  . Not  on file   Social Determinants of Health   Financial Resource Strain:   . Difficulty of Paying Living Expenses: Not on file  Food Insecurity:   . Worried About Charity fundraiser in the Last Year: Not on file  . Ran Out of Food in the Last Year: Not on file  Transportation Needs:   . Lack of Transportation (Medical): Not on file  . Lack of Transportation (Non-Medical): Not on file  Physical Activity:   . Days of Exercise per Week: Not on file  . Minutes of Exercise per Session: Not on file  Stress:   .  Feeling of Stress : Not on file  Social Connections:   . Frequency of Communication with Friends and Family: Not on file  . Frequency of Social Gatherings with Friends and Family: Not on file  . Attends Religious Services: Not on file  . Active Member of Clubs or Organizations: Not on file  . Attends Archivist Meetings: Not on file  . Marital Status: Not on file    Allergies: Allergies  Allergen Reactions  . Codeine Other (See Comments)    dizzy  . Latex Hives  . Other Other (See Comments)    Allergy to msg   Reaction- headache Allergy to saccharin-  Reaction- headache  . Benadryl [Diphenhydramine] Rash  . Nitrofurantoin Monohyd Macro Rash  . Sulfa Antibiotics Rash    Outpatient Meds: Current Outpatient Medications  Medication Sig Dispense Refill  . albuterol (VENTOLIN HFA) 108 (90 Base) MCG/ACT inhaler Inhale 2 puffs into the lungs every 6 (six) hours as needed for wheezing or shortness of breath. 18 g 1  . atorvastatin (LIPITOR) 10 MG tablet Take 10 mg by mouth at bedtime.    . BD PEN NEEDLE NANO 2ND GEN 32G X 4 MM MISC USE TO ADMINSTER INSULIN ONCE A DAY SUBCUTANEOUS AS DIRECTED    . brimonidine (ALPHAGAN P) 0.1 % SOLN Place 1 drop into both eyes every morning.    . cholecalciferol (VITAMIN D) 1000 units tablet Take 1,000 Units by mouth daily.    Marland Kitchen FARXIGA 10 MG TABS tablet Take 10 mg by mouth daily.  6  . fexofenadine (ALLEGRA) 180 MG tablet Take 180 mg  by mouth daily.    . Insulin Glargine (BASAGLAR KWIKPEN) 100 UNIT/ML SOPN INJECT 100 UNITS (TITRATE AS DIRECTED, MAX DAILY DOSE OF 100 UNITS) ONCE A DAY    . latanoprost (XALATAN) 0.005 % ophthalmic solution Place 1 drop into both eyes at bedtime.    Marland Kitchen levothyroxine (SYNTHROID, LEVOTHROID) 25 MCG tablet TAKE 1 TABLET BY MOUTH EVERY DAY 30 tablet 0  . metFORMIN (GLUCOPHAGE-XR) 500 MG 24 hr tablet Take 2 tablets (1,000 mg total) by mouth 2 (two) times daily. 120 tablet 1  . polyethylene glycol powder (GLYCOLAX/MIRALAX) 17 GM/SCOOP powder Take 17 grams po prn    . Spacer/Aero-Holding Chambers (AEROCHAMBER PLUS) inhaler Use as instructed to use with inahaler. 1 each 1  . triamterene-hydrochlorothiazide (MAXZIDE-25) 37.5-25 MG tablet Take 1 tablet by mouth every morning.     No current facility-administered medications for this visit.      ___________________________________________________________________ Objective   Exam:  BP 124/70   Pulse 68   Temp (!) 97.2 F (36.2 C)   Ht 5\' 4"  (1.626 m)   Wt 229 lb 9.6 oz (104.1 kg)   LMP 08/20/2014   BMI 39.41 kg/m    General: Well-appearing, pleasant and conversational, normal vocal quality  Eyes: sclera anicteric, no redness  ENT: oral mucosa moist without lesions, no cervical or supraclavicular lymphadenopathy  CV: RRR without murmur, S1/S2, no JVD, no peripheral edema  Resp: clear to auscultation bilaterally, normal RR and effort noted  GI: soft, scattered tenderness, somewhat over midline hernia, active bowel sounds. No guarding or palpable organomegaly noted.  Limited by body habitus  Skin; warm and dry, no rash or jaundice noted  Neuro: awake, alert and oriented x 3. Normal gross motor function and fluent speech   Radiologic Studies:  No report from CT abdomen and pelvis dated 07/22/2019 is yet available  My review of it shows abdominal wall  hernia, hepatomegaly, no clear obstruction or mass in the  intestine.  Assessment: Encounter Diagnoses  Name Primary?  Marland Kitchen Altered bowel habits Yes  . Chronic cough    Her bowel habits are irregular, it sounds likely to be benign.  I think it is related to irregularities and unpredictability in her daily schedule.  She has made every effort to eat a varied diet, get plenty of fluid intake.  Her physical activity has been decreased over this last year since the gym has been closed from Covid. I encouraged her to use the MiraLAX in small doses as needed.  Adequate dietary fiber and water and physical activity when she can.  Her chronic cough does not at all sound likely to be reflux related.  She is in need of a screening colonoscopy, and we discussed the nature of that procedure and its preparation.  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  She was encouraged to call us when ready to schedule her colonoscopy.   Thank you for the courtesy of this consult.  Please call me with any questions or concerns.  Erika [name]  CC: Referring provider noted above

## 2019-08-05 NOTE — Telephone Encounter (Signed)
There is no report from the radiologist on that CT scan.  Also, the images do not seem to be available, as they will not open in the viewing software.  I do not think you can solve the technical issue of the images not opening, but it would be helpful for the radiology department to at least put in a written report on the scan.

## 2019-08-05 NOTE — Patient Instructions (Signed)
If you are age 56 or older, your body mass index should be between 23-30. Your Body mass index is 39.41 kg/m. If this is out of the aforementioned range listed, please consider follow up with your Primary Care Provider.  If you are age 60 or younger, your body mass index should be between 19-25. Your Body mass index is 39.41 kg/m. If this is out of the aformentioned range listed, please consider follow up with your Primary Care Provider.   It has been recommended to you by your physician that you have a(n) Colonoscopy completed. Per your request, we did not schedule the procedure(s) today. Please contact our office at [phone] should you decide to have the procedure completed. You will be scheduled for a pre-visit and procedure at that time.  It was a pleasure to see you today!  Dr. Loletha Carrow

## 2019-08-06 ENCOUNTER — Other Ambulatory Visit: Payer: Self-pay

## 2019-08-06 ENCOUNTER — Ambulatory Visit: Payer: BC Managed Care – PPO | Admitting: Pulmonary Disease

## 2019-08-06 ENCOUNTER — Encounter: Payer: Self-pay | Admitting: Pulmonary Disease

## 2019-08-06 VITALS — BP 120/78 | HR 90 | Temp 97.1°F | Ht 64.0 in | Wt 229.0 lb

## 2019-08-06 DIAGNOSIS — J4540 Moderate persistent asthma, uncomplicated: Secondary | ICD-10-CM | POA: Diagnosis not present

## 2019-08-06 MED ORDER — MONTELUKAST SODIUM 10 MG PO TABS: 10.0000 mg | ORAL_TABLET | Freq: Every day | ORAL | 11 refills | Status: DC

## 2019-08-06 NOTE — Patient Instructions (Signed)
Schedule you for spirometry with bronchodilator response and FENO Continue the Allegra. Start Singulair 10 mg a day We can resume the Symbicort with spacer.  You can also use additional puffs of Symbicort as needed for shortness of breath  Follow-up in clinic in 3 months

## 2019-08-06 NOTE — Addendum Note (Signed)
Addended by: Hildred Alamin I on: 08/06/2019 03:33 PM   Modules accepted: Orders

## 2019-08-06 NOTE — Progress Notes (Signed)
RACHAEL CARR    [id number]    [birth year]  Primary Care Physician:Jordan, Malka So, MD  Referring Physician: Martinique, Betty G, MD [address] [place],  [place] [address]  Chief complaint: Follow-up for chronic cough, asthma, lung nodule  HPI: 54 year old with history of asthma, chronic cough.  Diagnosed with asthma in early 2010.  Maintained on inhalers but not recently Last seen in pulmonary clinic in 2017 for lung nodules. Complains of worsening cough for the past 6 months.  Cough is nonproductive in nature.  Associated with dyspnea.  No wheeze, fevers, chills Given anti-GERD treatment with PPI with no relief  Smoked 5 cigarettes a day for 2 years in the 1980s. No alcohol, illegal drug use. She is married and lives with her husband and 2 cats. 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   Interim history: Tried Symbicort at last visit but stopped after she developed mild thrush Continues on albuterol Continues to have dyspnea on exertion, cough  Outpatient Encounter Medications as of 08/06/2019  Medication Sig  . albuterol (VENTOLIN HFA) 108 (90 Base) MCG/ACT inhaler Inhale 2 puffs into the lungs every 6 (six) hours as needed for wheezing or shortness of breath.  Marland Kitchen atorvastatin (LIPITOR) 10 MG tablet Take 10 mg by mouth at bedtime.  . BD PEN NEEDLE NANO 2ND GEN 32G X 4 MM MISC USE TO ADMINSTER INSULIN ONCE A DAY SUBCUTANEOUS AS DIRECTED  . brimonidine (ALPHAGAN P) 0.1 % SOLN Place 1 drop into both eyes every morning.  . cholecalciferol (VITAMIN D) 1000 units tablet Take 1,000 Units by mouth daily.  Marland Kitchen FARXIGA 10 MG TABS tablet Take 10 mg by mouth daily.  . fexofenadine (ALLEGRA) 180 MG tablet Take 180 mg by mouth daily.  . Insulin Glargine (BASAGLAR KWIKPEN) 100 UNIT/ML SOPN INJECT 100 UNITS (TITRATE AS DIRECTED, MAX DAILY DOSE OF 100 UNITS) ONCE A DAY  . latanoprost (XALATAN) 0.005 % ophthalmic solution Place 1 drop into both  eyes at bedtime.  Marland Kitchen levothyroxine (SYNTHROID, LEVOTHROID) 25 MCG tablet TAKE 1 TABLET BY MOUTH EVERY DAY  . metFORMIN (GLUCOPHAGE-XR) 500 MG 24 hr tablet Take 2 tablets (1,000 mg total) by mouth 2 (two) times daily.  . polyethylene glycol powder (GLYCOLAX/MIRALAX) 17 GM/SCOOP powder Take 17 grams po prn  . Spacer/Aero-Holding Chambers (AEROCHAMBER PLUS) inhaler Use as instructed to use with inahaler.  . triamterene-hydrochlorothiazide (MAXZIDE-25) 37.5-25 MG tablet Take 1 tablet by mouth every morning.   No facility-administered encounter medications on file as of 08/06/2019.   Physical Exam: Blood pressure 120/78, pulse 90, temperature (!) 97.1 F (36.2 C), temperature source Temporal, height 5\' 4"  (1.626 m), weight 229 lb (103.9 kg), last menstrual period 08/20/2014, SpO2 96 %. Gen:      No acute distress HEENT:  EOMI, sclera anicteric Neck:     No masses; no thyromegaly Lungs:    Clear to auscultation bilaterally; normal respiratory effort CV:         Regular rate and rhythm; no murmurs Abd:      + bowel sounds; soft, non-tender; no palpable masses, no distension Ext:    No edema; adequate peripheral perfusion Skin:      Warm and dry; no rash Neuro: alert and oriented x 3 Psych: normal mood and affect  Data Reviewed: Imaging: CT hematuria protocol 05/18/15 Several small pulmonary nodules in the lower lobes. Largest measures 6 mm. Images reviewed.  CT chest 12/23/15 Multiple  bilateral pulmonary nodules, The basal pulmonary nodules are stable compared to previous CT of abdomen.  CT chest 06/25/2016 Stable lung nodules.  CT chest 07/22/2019 Stable pulmonary nodules and thyroid enlargement I reviewed the images personally.  PFTs: 10/12/15 FVC 2.12 (59%], FEV1 1.71 [1%), F/F 81, TLC 62%, DLCO 83%. Minimal obstructive defect, moderate restriction.  Labs: CTD serologies 01/23/2016 ANA, Denice [place] 1, CCP, rheumatoid factor, SCL 70, Ro, La-negative CBC 07/09/2019-WBC 11.1, eos 1.5%, absolute  eosinophil count 166 IgE 07/09/2027-206  Assessment:  Chronic cough secondary to asthma, upper airway cough syndrome Stop Symbicort since she developed thrush.  I have asked her to resume it with the use of a spacer to reduce mouth deposition.  She can use additional doses of Symbicort as needed instead of the albuterol Instructed to rinse mouth after every use  She has not tolerated Flonase due to nose burning or chlorpheniramine due to sedation Continue Allegra, start Singulair 10 mg a day She is going to get in touch with her allergy doctor for evaluation.  Schedule spirometry and FENO  Lung nodules Stable on follow-up CT.  These are likely benign.  Plan/Recommendations: Symbicort with spacer Continue Allegra, start Singulair Spirometry, FENO  Marshell Garfinkel MD [facility] 08/06/2019, 3:05 PM  CC: Martinique, Betty G, MD

## 2019-08-08 ENCOUNTER — Ambulatory Visit: Payer: BC Managed Care – PPO | Attending: Internal Medicine

## 2019-08-08 DIAGNOSIS — Z23 Encounter for immunization: Secondary | ICD-10-CM

## 2019-08-08 NOTE — Progress Notes (Signed)
   Covid-19 Vaccination Clinic  Name:  Erika [name]    MRN: [id number] DOB: 1966-04-03  08/08/2019  [name] was observed post Covid-19 immunization for 15 minutes without incidence. She was provided with Vaccine Information Sheet and instruction to access the V-Safe system.   [name] was instructed to call 911 with any severe reactions post vaccine: Marland Kitchen Difficulty breathing  . Swelling of your face and throat  . A fast heartbeat  . A bad rash all over your body  . Dizziness and weakness    Immunizations Administered    Name Date Dose VIS Date Route   Pfizer COVID-19 Vaccine 08/08/2019  4:21 PM 0.3 mL 05/22/2019 Intramuscular   Manufacturer: [place]   Lot: [id number]   [place]: [id number]

## 2019-08-20 ENCOUNTER — Telehealth (INDEPENDENT_AMBULATORY_CARE_PROVIDER_SITE_OTHER): Payer: BC Managed Care – PPO | Admitting: Family Medicine

## 2019-08-20 ENCOUNTER — Other Ambulatory Visit: Payer: Self-pay

## 2019-08-20 DIAGNOSIS — R197 Diarrhea, unspecified: Secondary | ICD-10-CM | POA: Diagnosis not present

## 2019-08-20 DIAGNOSIS — J45909 Unspecified asthma, uncomplicated: Secondary | ICD-10-CM

## 2019-08-20 DIAGNOSIS — R5383 Other fatigue: Secondary | ICD-10-CM

## 2019-08-20 DIAGNOSIS — R059 Cough, unspecified: Secondary | ICD-10-CM

## 2019-08-20 DIAGNOSIS — R05 Cough: Secondary | ICD-10-CM | POA: Diagnosis not present

## 2019-08-20 NOTE — Progress Notes (Signed)
Virtual Visit via Video Note  I connected with Kortni  on 08/20/19 at  4:40 PM EST by a video enabled telemedicine application and verified that I am speaking with the correct person using two identifiers.  Location patient: home, [place] Location provider:work or home office Persons participating in the virtual visit: patient, provider  I discussed the limitations of evaluation and management by telemedicine and the availability of in person appointments. The patient expressed understanding and agreed to proceed.   HPI:  Acute visit for Diarrhea: -started 4 days ago -these symptoms started right after starting Singulair so she wonders if it could be a side effect to Singulair -symptoms included explosive watery diarrhea for several days, nausea, elevated BP now better, tired, dizzy, cough, congestion, fatigue, nasal congestion, temp up to 99.1 -denies any fevers, SOB, loss of taste or smell, vomiting, body aches -she stopped the singulair -she now is feeling better today -she had solid BM today and was able to eat today -she has asthma -asthma was worse for a few days and was using albuterol a few times per day, better today -cough is improving -no known sick contacts in the last 14 days, but teaches in person kindergarten, she wears a mask -she had her covid19 vaccine about 1 week or so ago  ROS: See pertinent positives and negatives per HPI.  Past Medical History:  Diagnosis Date  . Arthritis    hands, neck  . Asthma    no inhaler, no problems x 5 years.  . 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 Cervical dysplasia   . Depression    no med  . Diabetes mellitus without complication (HCC)    Type 2  . 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 Fibroid   . Goiter   . Heart murmur    never had any problems  . Hyperlipidemia   . Hypertension   . Hypothyroid   . Infertility, female   . Neuromuscular disorder (HCC)    neuropathy in  feet and finger tips  . Neuropathy    located fingers and toes  . Ocular hypertension   . PCOS (polycystic ovarian syndrome)   . Seasonal allergies   . Umbilical hernia   . Urinary incontinence     Past Surgical History:  Procedure Laterality Date  . BACK SURGERY  12/1997,01/12/1998,01/20/1998   x 3 surgeries - L4/L5/L6  . BREAST BIOPSY Right 12/14/2004  . COLPOSCOPY    . DILATATION & CURETTAGE/HYSTEROSCOPY WITH MYOSURE N/A 12/14/2014   Procedure: DILATATION & CURETTAGE/HYSTEROSCOPY WITH MYOSURE;  Surgeon: [name], MD;  Location: [facility];  Service: Gynecology;  Laterality: N/A;  to follow 2nd case 10:00?  needs one hour OR time  . DILATION AND CURETTAGE OF UTERUS  2010   endometrial polyp  . HYSTEROSCOPY  2004  . MYOMECTOMY  2004  . OVARY SURGERY  09/1998   bilateral ovarian transposition due to radiation treatment for spine lesion  . WISDOM TOOTH EXTRACTION      Family History  Problem Relation Age of Onset  . Lung cancer Mother   . Hypertension Maternal Aunt   . Breast cancer Maternal Aunt        Age 81  . Thyroid disease Maternal Aunt   . Colon cancer Maternal Aunt   . Lung cancer Maternal Uncle   . Esophageal cancer Maternal Uncle   . Cirrhosis Maternal Aunt   . Colon cancer Maternal  Aunt   . Throat cancer Brother     SOCIAL HX: see hpi   Current Outpatient Medications:  .  albuterol (VENTOLIN HFA) 108 (90 Base) MCG/ACT inhaler, Inhale 2 puffs into the lungs every 6 (six) hours as needed for wheezing or shortness of breath., Disp: 18 g, Rfl: 1 .  atorvastatin (LIPITOR) 10 MG tablet, Take 10 mg by mouth at bedtime., Disp: , Rfl:  .  BD PEN NEEDLE NANO 2ND GEN 32G X 4 MM MISC, USE TO ADMINSTER INSULIN ONCE A DAY SUBCUTANEOUS AS DIRECTED, Disp: , Rfl:  .  brimonidine (ALPHAGAN P) 0.1 % SOLN, Place 1 drop into both eyes every morning., Disp: , Rfl:  .  cholecalciferol (VITAMIN D) 1000 units tablet, Take 1,000 Units by mouth daily., Disp: , Rfl:  .  FARXIGA 10  MG TABS tablet, Take 10 mg by mouth daily., Disp: , Rfl: 6 .  fexofenadine (ALLEGRA) 180 MG tablet, Take 180 mg by mouth daily., Disp: , Rfl:  .  Insulin Glargine (BASAGLAR KWIKPEN) 100 UNIT/ML SOPN, INJECT 100 UNITS (TITRATE AS DIRECTED, MAX DAILY DOSE OF 100 UNITS) ONCE A DAY, Disp: , Rfl:  .  latanoprost (XALATAN) 0.005 % ophthalmic solution, Place 1 drop into both eyes at bedtime., Disp: , Rfl:  .  levothyroxine (SYNTHROID, LEVOTHROID) 25 MCG tablet, TAKE 1 TABLET BY MOUTH EVERY DAY, Disp: 30 tablet, Rfl: 0 .  metFORMIN (GLUCOPHAGE-XR) 500 MG 24 hr tablet, Take 2 tablets (1,000 mg total) by mouth 2 (two) times daily., Disp: 120 tablet, Rfl: 1 .  montelukast (SINGULAIR) 10 MG tablet, Take 1 tablet (10 mg total) by mouth at bedtime., Disp: 30 tablet, Rfl: 11 .  polyethylene glycol powder (GLYCOLAX/MIRALAX) 17 GM/SCOOP powder, Take 17 grams po prn, Disp: , Rfl:  .  Spacer/Aero-Holding Chambers (AEROCHAMBER PLUS) inhaler, Use as instructed to use with inahaler., Disp: 1 each, Rfl: 1 .  triamterene-hydrochlorothiazide (MAXZIDE-25) 37.5-25 MG tablet, Take 1 tablet by mouth every morning., Disp: , Rfl:   EXAM:  VITALS per patient if applicable:see hpi  GENERAL: alert, oriented, appears well and in no acute distress  HEENT: atraumatic, conjunttiva clear, no obvious abnormalities on inspection of external nose and ears  NECK: normal movements of the head and neck  LUNGS: on inspection no signs of respiratory distress, breathing rate appears normal, no obvious gross SOB, gasping or wheezing  CV: no obvious cyanosis  MS: moves all visible extremities without noticeable abnormality  PSYCH/NEURO: pleasant and cooperative, no obvious depression or anxiety, speech and thought processing grossly intact  ASSESSMENT AND PLAN:  Discussed the following assessment and plan:  Diarrhea, unspecified type  Cough  Fatigue, unspecified type  Asthma, unspecified asthma severity, unspecified whether  complicated, unspecified whether persistent  -we discussed possible serious and likely etiologies, options for evaluation and workup, limitations of telemedicine visit vs in person visit, treatment, treatment risks and precautions. Pt prefers to treat via telemedicine empirically rather then risking or undertaking an in person visit at this moment. Discussed potential etiologies and suspect viral infection, COVID19, influenza or other. Reaction to singulair or vaccine reaction are probably less likely. She is doing better today and reports asthma symptoms controlled with inhaler and better today. She opted for covid testing which [facility], [facility] care, home isolation. Discussed treatment options, including options available to higher risk pts if COVID19 test positive. Work slip in pt instructions.  Patient agrees to seek prompt in person care if worsening, positive test,  new symptoms arise, or if is not improving with treatment.   I discussed the assessment and treatment plan with the patient. The patient was provided an opportunity to ask questions and all were answered. The patient agreed with the plan and demonstrated an understanding of the instructions.   The patient was advised to call back or seek an in-person evaluation if the symptoms worsen or if the condition fails to improve as anticipated.   Lucretia Kern, DO

## 2019-08-20 NOTE — Patient Instructions (Signed)
Follow up: if worsening, new concerns, positive test, not improving or any concerns  Self Isolation/Home Quarantine: -see the CDC site for information:   RunningShows.co.za.html   -STAY HOME except for to seek medical care -stay in your own room away from others in your house and use a separate bathroom if possible -Wash hands frequently, disinfect high touch surface areas often, wear a mask if you leave your room and interact as little as possible with others -seek medical care immediately if worsening - call our office for a visit or call ahead if going elsewhere to an urgent care  -seek emergency care if very sick or severe symptoms - call 911 -isolate for at least 10 days from the onset of symptoms PLUS 3 days of no fever PLUS 3 days of improving symptoms  We recommend wearing a mask, social distancing, good hand hygiene and asking others  around you to do the same at all times when around others outside of your home unit throughout the [place] pandemic.    Novel Coronavirus Testing:                                  Positive test. These tests are not 100% perfect, but if you tested positive for COVID-19, this confirms that you have contracted the SARS-CoV-2 virus. STAY HOME to complete full Quarantine per CDC guidelines.  Negative test. These tests are not 100% perfect but if you tested negative for COVID-19, this indicates that you may not have contracted the SARS-CoV-2 virus. Follow your doctor's recommendations and the CDC guidelines.      WORK SLIP:  Please excuse patient Erika [name],  March 03, 1966, from work according to the [employer] guidelines for a COVID like illness. We advise 10 days minimum from the onset of symptoms (08/16/2019) PLUS 3 days of no fever PLUS 3 days of feeling better.  She/He may work remotely from home in self isolation if she is feeling better and wishes to do so.  Sincerely: E-signature: Dr. Colin Benton,  DO [place] [facility] Ph: [phone]

## 2019-08-21 ENCOUNTER — Telehealth: Payer: Self-pay | Admitting: Family Medicine

## 2019-08-21 ENCOUNTER — Encounter: Payer: Self-pay | Admitting: Family Medicine

## 2019-08-21 NOTE — Telephone Encounter (Signed)
I usually do not recommend medications to stop acute diarrhea unless having several in a day. OTC imodium 1-2 daily.  Adequate hydration. If nausea and vomiting, she may need to go to the ER. Thanks, BJ

## 2019-08-21 NOTE — Telephone Encounter (Signed)
Patient called back wanting to know if Dr. Maudie Mercury can call here in some medication for the diarrhea.  Please advise

## 2019-08-21 NOTE — Telephone Encounter (Signed)
Fyi - already sent letter on letterhead to pt's mychart.

## 2019-08-21 NOTE — Telephone Encounter (Signed)
Patient also wanted Dr. Maudie Mercury to know that she is allergic to Sulfa

## 2019-08-21 NOTE — Telephone Encounter (Signed)
Noted  

## 2019-08-21 NOTE — Telephone Encounter (Signed)
I called and spoke with patient. She is aware of message below. She will give the Imodium a try and let us know if she is not improving. Pt will continue to push fluids to keep hydrated.

## 2019-08-21 NOTE — Telephone Encounter (Signed)
Patient had a virtual visit with Dr. Maudie Mercury yesterday and Dr. Maudie Mercury wrote her a work note but is needing the work note transfer to letter head and sent to the patients MyChart.  WORK SLIP:  Please excuse patient Erika [name],  March 10, 1966, from work according to the [employer] guidelines for a COVID like illness. We advise 10 days minimum from the onset of symptoms (08/16/2019) PLUS 3 days of no fever PLUS 3 days of feeling better.  She/He may work remotely from home in self isolation if she is feeling better and wishes to do so.  Sincerely: E-signature: Dr. Colin Benton, DO [facility] Ph: [phone]    The patient also wanted to let Dr. Maudie Mercury know that [facility] and [facility] came back negative.  Please advise

## 2019-08-29 ENCOUNTER — Ambulatory Visit: Payer: BC Managed Care – PPO | Attending: Internal Medicine

## 2019-08-29 DIAGNOSIS — Z23 Encounter for immunization: Secondary | ICD-10-CM

## 2019-08-29 NOTE — Progress Notes (Signed)
   Covid-19 Vaccination Clinic  Name:  LINET IWEN    MRN: [id number] DOB: Oct 25, 1965  08/29/2019  Ms. Nish was observed post Covid-19 immunization for 15 minutes without incident. She was provided with Vaccine Information Sheet and instruction to access the V-Safe system.   Ms. Samadi was instructed to call 911 with any severe reactions post vaccine: Marland Kitchen Difficulty breathing  . Swelling of face and throat  . A fast heartbeat  . A bad rash all over body  . Dizziness and weakness   Immunizations Administered    Name Date Dose VIS Date Route   Pfizer COVID-19 Vaccine 08/29/2019  3:24 PM 0.3 mL 05/22/2019 Intramuscular   Manufacturer: [place]   Lot: [id number]   [place]: [id number]

## 2019-09-04 ENCOUNTER — Encounter: Payer: BC Managed Care – PPO | Admitting: Family Medicine

## 2019-10-01 ENCOUNTER — Other Ambulatory Visit (INDEPENDENT_AMBULATORY_CARE_PROVIDER_SITE_OTHER): Payer: Self-pay | Admitting: Ophthalmology

## 2019-10-01 MED ORDER — BRIMONIDINE TARTRATE 0.2 % OP SOLN: 1.0000 [drp] | Freq: Two times a day (BID) | OPHTHALMIC | 0 refills | Status: AC

## 2019-10-09 ENCOUNTER — Ambulatory Visit: Payer: BC Managed Care – PPO

## 2019-10-14 ENCOUNTER — Ambulatory Visit: Payer: BC Managed Care – PPO

## 2019-10-16 ENCOUNTER — Other Ambulatory Visit (INDEPENDENT_AMBULATORY_CARE_PROVIDER_SITE_OTHER): Payer: Self-pay | Admitting: Ophthalmology

## 2019-10-22 ENCOUNTER — Other Ambulatory Visit: Payer: Self-pay

## 2019-10-22 ENCOUNTER — Ambulatory Visit
Admission: RE | Admit: 2019-10-22 | Discharge: 2019-10-22 | Disposition: A | Discharge status: Home / Self Care | Payer: BC Managed Care – PPO | Source: Ambulatory Visit | Attending: Obstetrics and Gynecology | Admitting: Obstetrics and Gynecology

## 2019-10-22 ENCOUNTER — Ambulatory Visit: Payer: BC Managed Care – PPO

## 2019-10-22 DIAGNOSIS — Z1231 Encounter for screening mammogram for malignant neoplasm of breast: Secondary | ICD-10-CM

## 2019-11-02 ENCOUNTER — Other Ambulatory Visit (HOSPITAL_COMMUNITY): Payer: BC Managed Care – PPO

## 2019-11-11 ENCOUNTER — Ambulatory Visit: Payer: BC Managed Care – PPO | Admitting: Podiatry

## 2019-11-11 ENCOUNTER — Other Ambulatory Visit: Payer: Self-pay

## 2019-11-11 ENCOUNTER — Encounter: Payer: Self-pay | Admitting: Podiatry

## 2019-11-11 DIAGNOSIS — L309 Dermatitis, unspecified: Secondary | ICD-10-CM | POA: Diagnosis not present

## 2019-11-11 DIAGNOSIS — B351 Tinea unguium: Secondary | ICD-10-CM | POA: Diagnosis not present

## 2019-11-11 DIAGNOSIS — M79674 Pain in right toe(s): Secondary | ICD-10-CM | POA: Diagnosis not present

## 2019-11-11 DIAGNOSIS — E1140 Type 2 diabetes mellitus with diabetic neuropathy, unspecified: Secondary | ICD-10-CM

## 2019-11-11 DIAGNOSIS — E1149 Type 2 diabetes mellitus with other diabetic neurological complication: Secondary | ICD-10-CM

## 2019-11-11 DIAGNOSIS — M79675 Pain in left toe(s): Secondary | ICD-10-CM

## 2019-11-11 MED ORDER — TERBINAFINE HCL 250 MG PO TABS: ORAL_TABLET | ORAL | 0 refills | Status: DC

## 2019-11-12 NOTE — Progress Notes (Signed)
Subjective:   Patient ID: Erika [name], female   DOB: 54 y.o.   MRN: [id number]   HPI Patient presents stating that I got concerns about my nails of both feet and I had drainage from the left big one.  I do have long-term diabetes and do not have good feeling in my feet and currently patient does not smoke and would like to be more active but does not have any issues as far as balance currently   Review of Systems  All other systems reviewed and are negative.       Objective:  Physical Exam Vitals and nursing note reviewed.  Constitutional:      Appearance: She is well-developed.  Pulmonary:     Effort: Pulmonary effort is normal.  Musculoskeletal:        General: Normal range of motion.  Skin:    General: Skin is warm.  Neurological:     Mental Status: She is alert.     Neurovascular status intact muscle strength found to be adequate range of motion within normal limits.  Patient is noted to have severely thickened nailbeds 1-5 both feet with dystrophic type tissue formation incurvation of the left hallux but no active drainage noted.  Patient has significant diminishment of sharp dull vibratory bilateral and has good digital perfusion     Assessment:  Patient has mycotic infections of nailbeds 1-5 both feet with symptoms and she cannot take care of them herself with patient having significant at rest neuropathy but no indications of current infection     [name]:  H&P education rendered concerning diabetes and daily inspections and I went ahead debrided nailbeds 1-5 today with no iatrogenic bleeding and reappoint for routine care and will come in earlier if any pathology were to be noted

## 2019-11-18 ENCOUNTER — Ambulatory Visit: Payer: BC Managed Care – PPO | Admitting: Podiatry

## 2019-11-30 ENCOUNTER — Other Ambulatory Visit (INDEPENDENT_AMBULATORY_CARE_PROVIDER_SITE_OTHER): Payer: Self-pay | Admitting: Ophthalmology

## 2019-12-12 ENCOUNTER — Other Ambulatory Visit (HOSPITAL_COMMUNITY): Payer: BC Managed Care – PPO

## 2020-01-26 ENCOUNTER — Other Ambulatory Visit: Payer: Self-pay | Admitting: Internal Medicine

## 2020-01-26 DIAGNOSIS — E042 Nontoxic multinodular goiter: Secondary | ICD-10-CM

## 2020-01-28 ENCOUNTER — Other Ambulatory Visit: Payer: BC Managed Care – PPO

## 2020-01-29 ENCOUNTER — Other Ambulatory Visit: Payer: Self-pay | Admitting: Internal Medicine

## 2020-02-05 ENCOUNTER — Other Ambulatory Visit: Payer: BC Managed Care – PPO

## 2020-02-17 ENCOUNTER — Other Ambulatory Visit: Payer: BC Managed Care – PPO

## 2020-02-24 ENCOUNTER — Other Ambulatory Visit: Payer: Self-pay

## 2020-02-24 ENCOUNTER — Encounter: Payer: Self-pay | Admitting: Podiatry

## 2020-02-24 ENCOUNTER — Ambulatory Visit: Payer: BC Managed Care – PPO | Admitting: Podiatry

## 2020-02-24 DIAGNOSIS — B351 Tinea unguium: Secondary | ICD-10-CM

## 2020-02-24 DIAGNOSIS — E1140 Type 2 diabetes mellitus with diabetic neuropathy, unspecified: Secondary | ICD-10-CM

## 2020-02-24 DIAGNOSIS — M79675 Pain in left toe(s): Secondary | ICD-10-CM

## 2020-02-24 DIAGNOSIS — M79674 Pain in right toe(s): Secondary | ICD-10-CM | POA: Diagnosis not present

## 2020-02-24 MED ORDER — NONFORMULARY OR COMPOUNDED ITEM: 3 refills | Status: DC

## 2020-02-24 NOTE — Patient Instructions (Signed)
[facility] [phone] Antifungal nail solution 

## 2020-02-28 NOTE — Progress Notes (Signed)
Subjective:  Patient ID: Erika [name], female    DOB: 28-Sep-1965,  MRN: [id number]  54 y.o. female presents with preventative diabetic foot care and painful thick toenails that are difficult to trim. Pain interferes with ambulation. Aggravating factors include wearing enclosed shoe gear. Pain is relieved with periodic professional debridement.   She states she has completed pulse dosed terbinafine for the first week of every months for 4 months. She relates she has not really noticed a change in appearance of her toenails and would like to discuss other treatment options.  Review of Systems: Negative except as noted in the HPI.  Past Medical History:  Diagnosis Date  . Arthritis    hands, neck  . Asthma    no inhaler, no problems x 5 years.  . 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 Cervical dysplasia   . Depression    no med  . Diabetes mellitus without complication (HCC)    Type 2  . 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 Fibroid   . Goiter   . Heart murmur    never had any problems  . Hyperlipidemia   . Hypertension   . Hypothyroid   . Infertility, female   . 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 Neuropathy    located fingers and toes  . Ocular hypertension   . PCOS (polycystic ovarian syndrome)   . Seasonal allergies   . Umbilical hernia   . Urinary incontinence    Past Surgical History:  Procedure Laterality Date  . BACK SURGERY  12/1997,01/12/1998,01/20/1998   x 3 surgeries - L4/L5/L6  . BREAST BIOPSY Right 12/14/2004  . COLPOSCOPY    . DILATATION & CURETTAGE/HYSTEROSCOPY WITH MYOSURE N/A 12/14/2014   Procedure: DILATATION & CURETTAGE/HYSTEROSCOPY WITH MYOSURE;  Surgeon: [name], MD;  Location: [facility];  Service: Gynecology;  Laterality: N/A;  to follow 2nd case 10:00?  needs one hour OR time  . DILATION AND CURETTAGE OF UTERUS  2010   endometrial polyp  . HYSTEROSCOPY  2004   . MYOMECTOMY  2004  . OVARY SURGERY  09/1998   bilateral ovarian transposition due to radiation treatment for spine lesion  . WISDOM TOOTH EXTRACTION     Patient Active Problem List   Diagnosis Date Noted  . Diabetes mellitus ([place]) 12/25/2016  . Epidermoid cyst of skin of ear 12/25/2016  . Goiter 12/25/2016  . Laryngopharyngeal reflux (LPR) 12/25/2016  . Incisional hernia 03/20/2013  . Hypertension 08/09/2011  . PCOS (polycystic ovarian syndrome) 08/09/2011    Current Outpatient Medications:  .  albuterol (VENTOLIN HFA) 108 (90 Base) MCG/ACT inhaler, Inhale 2 puffs into the lungs every 6 (six) hours as needed for wheezing or shortness of breath., Disp: 18 g, Rfl: 1 .  atorvastatin (LIPITOR) 10 MG tablet, Take 10 mg by mouth at bedtime., Disp: , Rfl:  .  BD PEN NEEDLE NANO 2ND GEN 32G X 4 MM MISC, USE TO ADMINSTER INSULIN ONCE A DAY SUBCUTANEOUS AS DIRECTED, Disp: , Rfl:  .  brimonidine (ALPHAGAN P) 0.1 % SOLN, Place 1 drop into both eyes every morning., Disp: , Rfl:  .  brimonidine (ALPHAGAN) 0.2 % ophthalmic solution, Place 1 drop into both eyes 2 (two) times daily., Disp: 5 mL, Rfl: 0 .  budesonide-formoterol (SYMBICORT) 80-4.5 MCG/ACT inhaler, TAKE 2 PUFFS BY MOUTH TWICE A DAY, Disp: , Rfl:  .  chlorhexidine ([place])  0.12 % solution, SMARTSIG:0.5 By Mouth Twice Daily, Disp: , Rfl:  .  cholecalciferol (VITAMIN D) 1000 units tablet, Take 1,000 Units by mouth daily., Disp: , Rfl:  .  FARXIGA 10 MG TABS tablet, Take 10 mg by mouth daily., Disp: , Rfl: 6 .  fexofenadine (ALLEGRA) 180 MG tablet, Take 180 mg by mouth daily., Disp: , Rfl:  .  flurbiprofen (ANSAID) 100 MG tablet, Take 100 mg by mouth 2 (two) times daily., Disp: , Rfl:  .  Insulin Glargine (BASAGLAR KWIKPEN) 100 UNIT/ML SOPN, INJECT 100 UNITS (TITRATE AS DIRECTED, MAX DAILY DOSE OF 100 UNITS) ONCE A DAY, Disp: , Rfl:  .  latanoprost (XALATAN) 0.005 % ophthalmic solution, Place 1 drop into both eyes at bedtime., Disp: , Rfl:   .  levothyroxine (SYNTHROID, LEVOTHROID) 25 MCG tablet, TAKE 1 TABLET BY MOUTH EVERY DAY, Disp: 30 tablet, Rfl: 0 .  metFORMIN (GLUCOPHAGE-XR) 500 MG 24 hr tablet, Take 2 tablets (1,000 mg total) by mouth 2 (two) times daily., Disp: 120 tablet, Rfl: 1 .  montelukast (SINGULAIR) 10 MG tablet, Take 1 tablet (10 mg total) by mouth at bedtime., Disp: 30 tablet, Rfl: 11 .  NONFORMULARY OR COMPOUNDED ITEM, Antifungal solution: Terbinafine 3%, Fluconazole 2%, Tea Tree Oil 5%, Urea 10%, Ibuprofen 2% in DMSO suspension #32mL, Disp: 1 each, Rfl: 3 .  penicillin v potassium (VEETID) 500 MG tablet, Take 500 mg by mouth 4 (four) times daily., Disp: , Rfl:  .  polyethylene glycol powder (GLYCOLAX/MIRALAX) 17 GM/SCOOP powder, Take 17 grams po prn, Disp: , Rfl:  .  pseudoephedrine (SUDAFED) 30 MG tablet, Take by mouth., Disp: , Rfl:  .  Spacer/Aero-Holding Chambers (AEROCHAMBER PLUS) inhaler, Use as instructed to use with inahaler., Disp: 1 each, Rfl: 1 .  traMADol (ULTRAM) 50 MG tablet, Take 50 mg by mouth every 6 (six) hours as needed., Disp: , Rfl:  .  triamterene-hydrochlorothiazide (MAXZIDE-25) 37.5-25 MG tablet, Take 1 tablet by mouth every morning., Disp: , Rfl:  .  TRULICITY 0.73 XT/0.6YI SOPN, SMARTSIG:0.5 Milliliter(s) SUB-Q Once a Week, Disp: , Rfl:  .  VASCEPA 1 g capsule, Take 1 g by mouth 2 (two) times daily., Disp: , Rfl:  .  XIGDUO XR 10-998 MG TB24, Take 1 tablet by mouth 2 (two) times daily., Disp: , Rfl:  Allergies  Allergen Reactions  . Codeine Other (See Comments)    dizzy  . Latex Hives  . Other Other (See Comments)    Allergy to msg   Reaction- headache Allergy to saccharin-  Reaction- headache  . Benadryl [Diphenhydramine] Rash  . Nitrofurantoin Monohyd Macro Rash  . Sulfa Antibiotics Rash   Social History   Tobacco Use  Smoking Status Former Smoker  . Packs/day: 0.25  . Years: 9.00  . Pack years: 2.25  . Types: Cigarettes  . Quit date: 08/22/1985  . Years since quitting:  34.5  Smokeless Tobacco Never Used   Objective:  There were no vitals filed for this visit. Constitutional Patient is a pleasant 54 y.o. Caucasian female in NAD.Marland Kitchen AAO x 3.  Vascular Capillary fill time to digits <3 seconds b/l lower extremities. Palpable pedal pulses b/l LE. Pedal hair sparse. Lower extremity skin temperature gradient within normal limits. No pain with calf compression b/l. No cyanosis or clubbing noted.  Neurologic Normal speech. Oriented to person, place, and time. Pt has subjective symptoms of neuropathy. Protective sensation diminished with 10g monofilament b/l.  Dermatologic Pedal skin with normal turgor, texture and tone bilaterally.  No open wounds bilaterally. No interdigital macerations bilaterally. Toenails 1-5 b/l elongated, discolored, dystrophic, thickened, crumbly with subungual debris and tenderness to dorsal palpation.  Orthopedic: Normal muscle strength 5/5 to all lower extremity muscle groups bilaterally. Patient ambulates independent of any assistive aids.   Assessment:   1. Pain due to onychomycosis of toenails of both feet   2. Diabetic neuropathy with neurologic complication [facility])    Plan:  Patient was evaluated and treated and all questions answered.  Onychomycosis with pain -Nails palliatively debridement as below. -Educated on self-care  Procedure: Nail Debridement Rationale: Pain Type of Debridement: manual, sharp debridement. Instrumentation: Nail nipper, rotary burr. Number of Nails: 10  -Examined patient. -Continue diabetic foot care principles. -Toenails 1-5 b/l were debrided in length and girth with sterile nail nippers and dremel without iatrogenic bleeding.  -Patient to report any pedal injuries to medical professional immediately. -Discussed topical, laser and oral medication. She has completed pulse-dosed terbinafine: 1 tablet by mouth the first week of months June, July, August and September. Today, she opted for topical  treatment with compounded medication. Rx written for nonformulary compounding topical antifungal: [facility]: Antifungal cream - Terbinafine 3%, Fluconazole 2%, Tea Tree Oil 5%, Urea 10%, Ibuprofen 2% in DMSO Suspension #20ml. Apply to the affected nail(s) at bedtime. -Patient to continue soft, supportive shoe gear daily. -Patient/POA to call should there be question/concern in the interim.  Return in about 3 months (around 05/25/2020) for diabetic nail trim.  Marzetta Board, DPM

## 2020-03-20 ENCOUNTER — Other Ambulatory Visit: Payer: Self-pay | Admitting: Family Medicine

## 2020-04-27 ENCOUNTER — Ambulatory Visit
Admission: RE | Admit: 2020-04-27 | Discharge: 2020-04-27 | Disposition: A | Discharge status: Home / Self Care | Payer: BC Managed Care – PPO | Source: Ambulatory Visit | Attending: Internal Medicine | Admitting: Internal Medicine

## 2020-04-27 DIAGNOSIS — E042 Nontoxic multinodular goiter: Secondary | ICD-10-CM

## 2020-05-25 ENCOUNTER — Other Ambulatory Visit: Payer: Self-pay

## 2020-05-25 ENCOUNTER — Ambulatory Visit: Payer: BC Managed Care – PPO | Admitting: Podiatry

## 2020-05-25 DIAGNOSIS — R1310 Dysphagia, unspecified: Secondary | ICD-10-CM | POA: Insufficient documentation

## 2020-05-25 DIAGNOSIS — Z79899 Other long term (current) drug therapy: Secondary | ICD-10-CM

## 2020-05-25 DIAGNOSIS — E1165 Type 2 diabetes mellitus with hyperglycemia: Secondary | ICD-10-CM | POA: Insufficient documentation

## 2020-05-25 DIAGNOSIS — M79675 Pain in left toe(s): Secondary | ICD-10-CM

## 2020-05-25 DIAGNOSIS — E1140 Type 2 diabetes mellitus with diabetic neuropathy, unspecified: Secondary | ICD-10-CM

## 2020-05-25 DIAGNOSIS — E785 Hyperlipidemia, unspecified: Secondary | ICD-10-CM | POA: Insufficient documentation

## 2020-05-25 DIAGNOSIS — E1149 Type 2 diabetes mellitus with other diabetic neurological complication: Secondary | ICD-10-CM | POA: Diagnosis not present

## 2020-05-25 DIAGNOSIS — E042 Nontoxic multinodular goiter: Secondary | ICD-10-CM | POA: Insufficient documentation

## 2020-05-25 DIAGNOSIS — E669 Obesity, unspecified: Secondary | ICD-10-CM | POA: Insufficient documentation

## 2020-05-25 DIAGNOSIS — B351 Tinea unguium: Secondary | ICD-10-CM | POA: Diagnosis not present

## 2020-05-25 DIAGNOSIS — M79674 Pain in right toe(s): Secondary | ICD-10-CM

## 2020-05-25 DIAGNOSIS — E781 Pure hyperglyceridemia: Secondary | ICD-10-CM | POA: Insufficient documentation

## 2020-05-29 ENCOUNTER — Encounter: Payer: Self-pay | Admitting: Podiatry

## 2020-05-29 NOTE — Progress Notes (Signed)
Subjective:  Patient ID: Erika [name], female    DOB: Feb 21, 1966,  MRN: [id number]  55 y.o. female presents with preventative diabetic foot care and painful thick toenails that are difficult to trim. Pain interferes with ambulation. Aggravating factors include wearing enclosed shoe gear. Pain is relieved with periodic professional debridement.   She  has completed pulse dosed terbinafine for the first week of every months for 4 months.   She has brought in her bottle of topical antifungal solution from [place]. She would like clarification on applying medication.   She has not started using it as she has questions about medication: 1. What happens if she gets it on her skin? 2. What happens if a nail comes off?  Review of Systems: Negative except as noted in the HPI.  Past Medical History:  Diagnosis Date  . Arthritis    hands, neck  . Asthma    no inhaler, no problems x 5 years.  . 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 Cervical dysplasia   . Depression    no med  . Diabetes mellitus without complication (HCC)    Type 2  . 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 Fibroid   . Goiter   . Heart murmur    never had any problems  . Hyperlipidemia   . Hypertension   . Hypothyroid   . Infertility, female   . 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 Neuropathy    located fingers and toes  . Ocular hypertension   . PCOS (polycystic ovarian syndrome)   . Seasonal allergies   . Umbilical hernia   . Urinary incontinence    Past Surgical History:  Procedure Laterality Date  . BACK SURGERY  12/1997,01/12/1998,01/20/1998   x 3 surgeries - L4/L5/L6  . BREAST BIOPSY Right 12/14/2004  . COLPOSCOPY    . DILATATION & CURETTAGE/HYSTEROSCOPY WITH MYOSURE N/A 12/14/2014   Procedure: DILATATION & CURETTAGE/HYSTEROSCOPY WITH MYOSURE;  Surgeon: [name], MD;  Location: [facility];  Service:  Gynecology;  Laterality: N/A;  to follow 2nd case 10:00?  needs one hour OR time  . DILATION AND CURETTAGE OF UTERUS  2010   endometrial polyp  . HYSTEROSCOPY  2004  . MYOMECTOMY  2004  . OVARY SURGERY  09/1998   bilateral ovarian transposition due to radiation treatment for spine lesion  . WISDOM TOOTH EXTRACTION     Patient Active Problem List   Diagnosis Date Noted  . Dyslipidemia 05/25/2020  . Dysphagia 05/25/2020  . Hyperglycemia due to type 2 diabetes mellitus ([place]) 05/25/2020  . Hypertriglyceridemia 05/25/2020  . Multinodular goiter 05/25/2020  . Obesity 05/25/2020  . Diabetes mellitus ([place]) 12/25/2016  . Epidermoid cyst of skin of ear 12/25/2016  . Goiter 12/25/2016  . Laryngopharyngeal reflux (LPR) 12/25/2016  . Incisional hernia 03/20/2013  . Hypertension 08/09/2011  . PCOS (polycystic ovarian syndrome) 08/09/2011    Current Outpatient Medications:  .  albuterol (VENTOLIN HFA) 108 (90 Base) MCG/ACT inhaler, Inhale 2 puffs into the lungs every 6 (six) hours as needed for wheezing or shortness of breath., Disp: 18 g, Rfl: 1 .  atorvastatin (LIPITOR) 10 MG tablet, Take 10 mg by mouth at bedtime., Disp: , Rfl:  .  BD PEN NEEDLE NANO 2ND GEN 32G X 4 MM MISC, USE TO ADMINSTER INSULIN ONCE A DAY SUBCUTANEOUS AS DIRECTED, Disp: , Rfl:  .  brimonidine (  ALPHAGAN P) 0.1 % SOLN, Place 1 drop into both eyes every morning., Disp: , Rfl:  .  brimonidine (ALPHAGAN) 0.2 % ophthalmic solution, Place 1 drop into both eyes 2 (two) times daily., Disp: 5 mL, Rfl: 0 .  chlorhexidine (PERIDEX) 0.12 % solution, SMARTSIG:0.5 By Mouth Twice Daily, Disp: , Rfl:  .  cholecalciferol (VITAMIN D) 1000 units tablet, Take 1,000 Units by mouth daily., Disp: , Rfl:  .  FARXIGA 10 MG TABS tablet, Take 10 mg by mouth daily., Disp: , Rfl: 6 .  fexofenadine (ALLEGRA) 180 MG tablet, Take 180 mg by mouth daily., Disp: , Rfl:  .  flurbiprofen (ANSAID) 100 MG tablet, Take 100 mg by mouth 2 (two) times daily.,  Disp: , Rfl:  .  Insulin Glargine (BASAGLAR KWIKPEN) 100 UNIT/ML SOPN, INJECT 100 UNITS (TITRATE AS DIRECTED, MAX DAILY DOSE OF 100 UNITS) ONCE A DAY, Disp: , Rfl:  .  latanoprost (XALATAN) 0.005 % ophthalmic solution, Place 1 drop into both eyes at bedtime., Disp: , Rfl:  .  levothyroxine (SYNTHROID, LEVOTHROID) 25 MCG tablet, TAKE 1 TABLET BY MOUTH EVERY DAY, Disp: 30 tablet, Rfl: 0 .  metFORMIN (GLUCOPHAGE-XR) 500 MG 24 hr tablet, Take 2 tablets (1,000 mg total) by mouth 2 (two) times daily., Disp: 120 tablet, Rfl: 1 .  montelukast (SINGULAIR) 10 MG tablet, Take 1 tablet (10 mg total) by mouth at bedtime., Disp: 30 tablet, Rfl: 11 .  NONFORMULARY OR COMPOUNDED ITEM, Antifungal solution: Terbinafine 3%, Fluconazole 2%, Tea Tree Oil 5%, Urea 10%, Ibuprofen 2% in DMSO suspension #73mL, Disp: 1 each, Rfl: 3 .  penicillin v potassium (VEETID) 500 MG tablet, Take 500 mg by mouth 4 (four) times daily., Disp: , Rfl:  .  polyethylene glycol powder (GLYCOLAX/MIRALAX) 17 GM/SCOOP powder, Take 17 grams po prn, Disp: , Rfl:  .  pseudoephedrine (SUDAFED) 30 MG tablet, Take by mouth., Disp: , Rfl:  .  Spacer/Aero-Holding Chambers (AEROCHAMBER PLUS) inhaler, Use as instructed to use with inahaler., Disp: 1 each, Rfl: 1 .  SYMBICORT 80-4.5 MCG/ACT inhaler, TAKE 2 PUFFS BY MOUTH TWICE A DAY, Disp: 10.2 each, Rfl: 2 .  traMADol (ULTRAM) 50 MG tablet, Take 50 mg by mouth every 6 (six) hours as needed., Disp: , Rfl:  .  triamterene-hydrochlorothiazide (MAXZIDE-25) 37.5-25 MG tablet, Take 1 tablet by mouth every morning., Disp: , Rfl:  .  TRULICITY 7.84 ON/6.2XB SOPN, SMARTSIG:0.5 Milliliter(s) SUB-Q Once a Week, Disp: , Rfl:  .  VASCEPA 1 g capsule, Take 1 g by mouth 2 (two) times daily., Disp: , Rfl:  .  XIGDUO XR 10-998 MG TB24, Take 1 tablet by mouth 2 (two) times daily., Disp: , Rfl:  Allergies  Allergen Reactions  . Codeine Other (See Comments)    dizzy  . Latex Hives  . Other Other (See Comments)     Allergy to msg   Reaction- headache Allergy to saccharin-  Reaction- headache  . Benadryl [Diphenhydramine] Rash  . Nitrofurantoin Monohyd Macro Rash  . Sulfa Antibiotics Rash   Social History   Tobacco Use  Smoking Status Former Smoker  . Packs/day: 0.25  . Years: 9.00  . Pack years: 2.25  . Types: Cigarettes  . Quit date: 08/22/1985  . Years since quitting: 34.7  Smokeless Tobacco Never Used   Objective:  There were no vitals filed for this visit. Constitutional Patient is a pleasant 54 y.o. Caucasian female in NAD.Marland Kitchen AAO x 3.  Vascular Capillary fill time to digits <3 seconds b/l  lower extremities. Palpable pedal pulses b/l LE. Pedal hair sparse. Lower extremity skin temperature gradient within normal limits. No pain with calf compression b/l. No cyanosis or clubbing noted.  Neurologic Normal speech. Oriented to person, place, and time. Pt has subjective symptoms of neuropathy. Protective sensation diminished with 10g monofilament b/l.  Dermatologic Pedal skin with normal turgor, texture and tone bilaterally. No open wounds bilaterally. No interdigital macerations bilaterally. Toenails 1-5 b/l elongated, discolored, dystrophic, thickened, crumbly with subungual debris and tenderness to dorsal palpation.  Orthopedic: Normal muscle strength 5/5 to all lower extremity muscle groups bilaterally. Patient ambulates independent of any assistive aids.   Assessment:   1. Pain due to onychomycosis of toenails of both feet   2. Diabetic neuropathy with neurologic complication [facility])    Plan:  Patient was evaluated and treated and all questions answered.  Onychomycosis with pain -Nails palliatively debridement as below. -Educated on self-care  Procedure: Nail Debridement Rationale: Pain Type of Debridement: manual, sharp debridement. Instrumentation: Nail nipper, rotary burr. Number of Nails: 10  -Examined patient. -Continue diabetic foot care principles. -Toenails 1-5 b/l were  debrided in length and girth with sterile nail nippers and dremel without iatrogenic bleeding.  -Patient to report any pedal injuries to medical professional immediately. -Demonstrated visually by applying medication to her toenails. If she gets some of the solution on her skin, she is to wipe off with a soft cloth. If one of her toenails come off, she is to discontinue applying the medication on that digit. She related understanding. -Patient to continue soft, supportive shoe gear daily. -Patient/POA to call should there be question/concern in the interim.  Return in about 3 months (around 08/23/2020) for diabetic toenails.  Marzetta Board, DPM

## 2020-07-27 ENCOUNTER — Encounter (INDEPENDENT_AMBULATORY_CARE_PROVIDER_SITE_OTHER): Payer: Self-pay | Admitting: Ophthalmology

## 2020-07-27 ENCOUNTER — Other Ambulatory Visit: Payer: Self-pay

## 2020-07-27 ENCOUNTER — Ambulatory Visit (INDEPENDENT_AMBULATORY_CARE_PROVIDER_SITE_OTHER): Payer: BC Managed Care – PPO | Admitting: Ophthalmology

## 2020-07-27 DIAGNOSIS — H33021 Retinal detachment with multiple breaks, right eye: Secondary | ICD-10-CM | POA: Diagnosis not present

## 2020-07-27 MED ORDER — PREDNISOLONE ACETATE 1 % OP SUSP: 1.0000 [drp] | Freq: Four times a day (QID) | OPHTHALMIC | 0 refills | Status: DC

## 2020-07-27 MED ORDER — OFLOXACIN 0.3 % OP SOLN: 1.0000 [drp] | Freq: Four times a day (QID) | OPHTHALMIC | 0 refills | Status: AC

## 2020-07-27 NOTE — Progress Notes (Signed)
07/27/2020     CHIEF COMPLAINT Patient presents for Retina Evaluation (WIP - RD OD - Ref'd by Dr. Patel//Pt c/o semi-circle in [place] OD x 5 days, located over her nose. Pt sts "it was either a cough or something that started it." Pt reports flashes white light in dark room OD. Pt reports intermittent gnats in [place] OD. VA OS stable. No pain./LBS: 155 today/A1c: 8. "something" 06/2020)   HISTORY OF PRESENT ILLNESS: Erika [name] is a 55 y.o. female who presents to the clinic today for:   HPI    Retina Evaluation    In right eye.  This started 5 days ago.  Duration of 5 days.  Associated Symptoms Floaters and Blind Spot. Additional comments: WIP - RD OD - Ref'd by Dr. Posey Pronto  Pt c/o semi-circle in [place] OD x 5 days, located over her nose. Pt sts "it was either a cough or something that started it." Pt reports flashes white light in dark room OD. Pt reports intermittent gnats in [place] OD. VA OS stable. No pain. LBS: 155 today A1c: 8. "something" 06/2020       Last edited by Rockie Neighbours, [place] on 07/27/2020  5:07 PM. (History)      Referring physician: Martin[place], Betty G, MD [place],  [place] [id number]  HISTORICAL INFORMATION:   Selected notes from the MEDICAL RECORD NUMBER    Lab Results  Component Value Date   HGBA1C 10.2 10/17/2011     CURRENT MEDICATIONS: Current Outpatient Medications (Ophthalmic Drugs)  Medication Sig  . brimonidine (ALPHAGAN P) 0.1 % SOLN Place 1 drop into both eyes every morning.  . brimonidine (ALPHAGAN) 0.2 % ophthalmic solution Place 1 drop into both eyes 2 (two) times daily.  Marland Kitchen latanoprost (XALATAN) 0.005 % ophthalmic solution Place 1 drop into both eyes at bedtime.   No current facility-administered medications for this visit. (Ophthalmic Drugs)   Current Outpatient Medications (Other)  Medication Sig  . albuterol (VENTOLIN HFA) 108 (90 Base) MCG/ACT inhaler Inhale 2 puffs into the lungs every 6 (six) hours as needed for wheezing or  shortness of breath.  Marland Kitchen atorvastatin (LIPITOR) 10 MG tablet Take 10 mg by mouth at bedtime.  . BD PEN NEEDLE NANO 2ND GEN 32G X 4 MM MISC USE TO ADMINSTER INSULIN ONCE A DAY SUBCUTANEOUS AS DIRECTED  . chlorhexidine (PERIDEX) 0.12 % solution SMARTSIG:0.5 By Mouth Twice Daily  . cholecalciferol (VITAMIN D) 1000 units tablet Take 1,000 Units by mouth daily.  Marland Kitchen FARXIGA 10 MG TABS tablet Take 10 mg by mouth daily.  . fexofenadine (ALLEGRA) 180 MG tablet Take 180 mg by mouth daily.  . flurbiprofen (ANSAID) 100 MG tablet Take 100 mg by mouth 2 (two) times daily.  . Insulin Glargine (BASAGLAR KWIKPEN) 100 UNIT/ML SOPN INJECT 100 UNITS (TITRATE AS DIRECTED, MAX DAILY DOSE OF 100 UNITS) ONCE A DAY  . levothyroxine (SYNTHROID, LEVOTHROID) 25 MCG tablet TAKE 1 TABLET BY MOUTH EVERY DAY  . metFORMIN (GLUCOPHAGE-XR) 500 MG 24 hr tablet Take 2 tablets (1,000 mg total) by mouth 2 (two) times daily.  . montelukast (SINGULAIR) 10 MG tablet Take 1 tablet (10 mg total) by mouth at bedtime.  . NONFORMULARY OR COMPOUNDED ITEM Antifungal solution: Terbinafine 3%, Fluconazole 2%, Tea Tree Oil 5%, Urea 10%, Ibuprofen 2% in DMSO suspension #15mL  . penicillin v potassium (VEETID) 500 MG tablet Take 500 mg by mouth 4 (four) times daily.  . polyethylene glycol powder (GLYCOLAX/MIRALAX) 17 GM/SCOOP powder  Take 17 grams po prn  . pseudoephedrine (SUDAFED) 30 MG tablet Take by mouth.  . Spacer/Aero-Holding Chambers (AEROCHAMBER PLUS) inhaler Use as instructed to use with inahaler.  . SYMBICORT 80-4.5 MCG/ACT inhaler TAKE 2 PUFFS BY MOUTH TWICE A DAY  . traMADol (ULTRAM) 50 MG tablet Take 50 mg by mouth every 6 (six) hours as needed.  . triamterene-hydrochlorothiazide (MAXZIDE-25) 37.5-25 MG tablet Take 1 tablet by mouth every morning.  . TRULICITY 9.93 ZJ/6.9CV SOPN SMARTSIG:0.5 Milliliter(s) SUB-Q Once a Week  . VASCEPA 1 g capsule Take 1 g by mouth 2 (two) times daily.  Marland Kitchen XIGDUO XR 10-998 MG TB24 Take 1 tablet by mouth 2  (two) times daily.   No current facility-administered medications for this visit. (Other)      REVIEW OF SYSTEMS:    ALLERGIES Allergies  Allergen Reactions  . Codeine Other (See Comments)    dizzy  . Latex Hives  . Other Other (See Comments)    Allergy to msg   Reaction- headache Allergy to saccharin-  Reaction- headache  . Benadryl [Diphenhydramine] Rash  . Nitrofurantoin Monohyd Macro Rash  . Sulfa Antibiotics Rash    PAST MEDICAL HISTORY Past Medical History:  Diagnosis Date  . Arthritis    hands, neck  . Asthma    no inhaler, no problems x 5 years.  . 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 Cervical dysplasia   . Depression    no med  . Diabetes mellitus without complication (HCC)    Type 2  . 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 Fibroid   . Goiter   . Heart murmur    never had any problems  . Hyperlipidemia   . Hypertension   . Hypothyroid   . Infertility, female   . 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 Neuropathy    located fingers and toes  . Ocular hypertension   . PCOS (polycystic ovarian syndrome)   . Seasonal allergies   . Umbilical hernia   . Urinary incontinence    Past Surgical History:  Procedure Laterality Date  . BACK SURGERY  12/1997,01/12/1998,01/20/1998   x 3 surgeries - L4/L5/L6  . BREAST BIOPSY Right 12/14/2004  . COLPOSCOPY    . DILATATION & CURETTAGE/HYSTEROSCOPY WITH MYOSURE N/A 12/14/2014   Procedure: DILATATION & CURETTAGE/HYSTEROSCOPY WITH MYOSURE;  Surgeon: [name], MD;  Location: [facility];  Service: Gynecology;  Laterality: N/A;  to follow 2nd case 10:00?  needs one hour OR time  . DILATION AND CURETTAGE OF UTERUS  2010   endometrial polyp  . HYSTEROSCOPY  2004  . MYOMECTOMY  2004  . OVARY SURGERY  09/1998   bilateral ovarian transposition due to radiation treatment for spine lesion  . WISDOM TOOTH EXTRACTION      FAMILY  HISTORY Family History  Problem Relation Age of Onset  . Lung cancer Mother   . Hypertension Maternal Aunt   . Breast cancer Maternal Aunt        Age 57  . Thyroid disease Maternal Aunt   . Colon cancer Maternal Aunt   . Lung cancer Maternal Uncle   . Esophageal cancer Maternal Uncle   . Cirrhosis Maternal Aunt   . Colon cancer Maternal Aunt   . Throat cancer Brother     SOCIAL HISTORY Social History   Tobacco Use  . Smoking status: Former Smoker  Packs/day: 0.25    Years: 9.00    Pack years: 2.25    Types: Cigarettes    Quit date: 08/22/1985    Years since quitting: 34.9  . Smokeless tobacco: Never Used  Vaping Use  . Vaping Use: Never used  Substance Use Topics  . Alcohol use: Yes    Alcohol/week: 0.0 standard drinks    Comment: rare wine  . Drug use: No         OPHTHALMIC EXAM:  Base Eye Exam    Visual Acuity (ETDRS)      Right Left   Dist cc 20/30 +2 20/30 -1   Dist ph cc NI 20/25   Correction: Glasses       Tonometry (Tonopen, 5:07 PM)      Right Left   Pressure 19 23       Pupils      Dark Light Shape React APD   Right 7 7 Round Dilated None   Left 7 7 Round Dilated None       Visual Fields (Counting fingers)      Left Right    Full    Restrictions  Total inferior temporal deficiency       Extraocular Movement      Right Left    Full Full       Neuro/Psych    Oriented x3: Yes   Mood/Affect: Normal       Dilation    Right eye: 1.0% Mydriacyl, 2.5% Phenylephrine @ 5:10 PM        Slit Lamp and Fundus Exam    External Exam      Right Left   External Normal Normal       Slit Lamp Exam      Right Left   Lids/Lashes Normal Normal   Conjunctiva/Sclera White and quiet White and quiet   Cornea Clear Clear   Anterior Chamber Deep and quiet Deep and quiet   Iris Round and reactive Round and reactive   Lens 2+ Nuclear sclerosis, trace PSC 2+ Nuclear sclerosis, trace PSC   Anterior Vitreous Normal Normal       Fundus Exam       Right Left   Disc Normal Normal   C/D Ratio 0.2    Macula attached    Vessels Normal    Periphery Posterior retinal break at 9:00 there is a separate retinal break at 10:00 with a detachment in the Right eye temporally extending from the 8 o'clock position to the 11 o'clock position superiorly Macula on.           IMAGING AND PROCEDURES  Imaging and Procedures for 07/27/20  Color Fundus Photography Optos - OU - Both Eyes       Right Eye Progression has no prior data. Periphery : detachment.   Left Eye Progression has no prior data. Macula : normal observations. Vessels : normal observations.   Notes Macula on retinal detachment right eye, with multiple retinal breaks, not conducive to pneumatic retinopexy repair         B-Scan Ultrasound - OD - Right Eye       Quality was good.   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  Possible side effects of the various surgical procedures were discussed with the patient.  Possible complications of surgery were discussed with the patient.  Explained the following success rates are involved with retinal detachment surgeries: 90% rate of reattachment success with primary operation, 85% chance of 2nd operation needed (if Pneumatic isn't done first), 70-75% chance of 3rd operation, & 50% chance of 4th & subsequent surgeries.   Patient understands that ongoing scarring of retina may lead to reoccurrence of retinal tears or detachments.  The patient's questions were answered. An informational brochure was given to the patient.  All the patient's questions were answered.Multiple retinal breaks, macula and retinal detachment, will need to proceed  with scleral buckle in the right eye is not amenable to pneumatic retinopexy.  Patient is scheduled for [place], patient be made requested to be n.p.o. tonight.  She is instructed not to use any of her insulin dosing tonight.  She is instructed also use normal medications with a small sip of water in the morning.      ICD-10-CM   1. 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  Risk benefits and reviewed with the patient.  Need to proceed with surgical intervention in order to slow progression of retinal detachment.  She understands there is no other therapy other than surgical repair and that with no history of cataract surgery that repair is best accomplished initially with scleral buckle retinal cryopexy  2.  3.  Ophthalmic Meds Ordered this visit:  No orders of the defined types were placed in this encounter.      Return ,,,, [facility], General via [facility], for schedule repair retinal detachment scleral buckle cryopexy right eye, OD.  Patient Instructions  Patient instructed to use her normal medications in the morning with small sip of water.    Explained the diagnoses, plan, and follow up with the patient and they expressed understanding.  Patient expressed understanding of the importance of proper follow up care.   Clent Demark Nirvana Blanchett M.D. Diseases & Surgery of the Retina and Vitreous Retina & Diabetic [place] 07/27/20     Abbreviations: M myopia (nearsighted); A astigmatism; H hyperopia (farsighted); P presbyopia; Mrx spectacle prescription;  CTL contact lenses; OD right eye; OS left eye; OU both eyes  XT exotropia; ET esotropia; PEK punctate epithelial keratitis; PEE punctate epithelial erosions; DES dry eye syndrome; MGD meibomian gland dysfunction; ATs artificial tears; PFAT's preservative free artificial tears; [place]  nuclear sclerotic cataract; PSC posterior subcapsular cataract; ERM epi-retinal membrane; PVD posterior vitreous detachment; RD retinal detachment; DM diabetes mellitus; DR diabetic retinopathy; NPDR non-proliferative diabetic retinopathy; PDR proliferative diabetic retinopathy; CSME clinically significant macular edema; DME diabetic macular edema; dbh dot blot hemorrhages; CWS cotton wool spot; POAG primary open angle glaucoma; C/D cup-to-disc ratio; HVF humphrey visual field; GVF goldmann visual field; OCT optical coherence tomography; IOP intraocular pressure; BRVO Branch retinal vein occlusion; CRVO central retinal vein occlusion; CRAO central retinal artery occlusion; BRAO branch retinal artery occlusion; RT retinal tear; SB scleral buckle; PPV pars plana vitrectomy; VH Vitreous hemorrhage; PRP panretinal laser photocoagulation; IVK intravitreal kenalog; VMT vitreomacular traction; MH Macular hole;  NVD neovascularization of the disc; NVE neovascularization elsewhere; AREDS age related eye disease study; ARMD age related macular degeneration; POAG primary open angle glaucoma; EBMD epithelial/anterior basement membrane  dystrophy; ACIOL anterior chamber intraocular lens; IOL intraocular lens; PCIOL posterior chamber intraocular lens; Phaco/IOL phacoemulsification with intraocular lens placement; [place] photorefractive keratectomy; LASIK laser assisted in situ keratomileusis; HTN hypertension; DM diabetes mellitus; COPD chronic obstructive pulmonary disease

## 2020-07-27 NOTE — Assessment & Plan Note (Addendum)
The nature of retinal detachment was discussed with the patient as well, as the treatment options which include pneumatic retinopexy, scleral buckling, and vitrectomy scleral buckling.  Possible side effects of the various surgical procedures were discussed with the patient.  Possible complications of surgery were discussed with the patient.  Explained the following success rates are involved with retinal detachment surgeries: 90% rate of reattachment success with primary operation, 85% chance of 2nd operation needed (if Pneumatic isn't done first), 70-75% chance of 3rd operation, & 50% chance of 4th & subsequent surgeries.   Patient understands that ongoing scarring of retina may lead to reoccurrence of retinal tears or detachments.  The patient's questions were answered. An informational brochure was given to the patient.  All the patient's questions were answered.Multiple retinal breaks, macula and retinal detachment, will need to proceed with scleral buckle in the right eye is not amenable to pneumatic retinopexy.  Patient is scheduled for [place], patient be made requested to be n.p.o. tonight.  She is instructed not to use any of her insulin dosing tonight.  She is instructed also use normal medications with a small sip of water in the morning.

## 2020-07-27 NOTE — Patient Instructions (Signed)
Patient instructed to use her normal medications in the morning with small sip of water.

## 2020-07-28 ENCOUNTER — Encounter (AMBULATORY_SURGERY_CENTER): Payer: BC Managed Care – PPO | Admitting: Ophthalmology

## 2020-07-28 DIAGNOSIS — H33021 Retinal detachment with multiple breaks, right eye: Secondary | ICD-10-CM | POA: Diagnosis not present

## 2020-07-29 ENCOUNTER — Ambulatory Visit (INDEPENDENT_AMBULATORY_CARE_PROVIDER_SITE_OTHER): Payer: BC Managed Care – PPO | Admitting: Ophthalmology

## 2020-07-29 DIAGNOSIS — H33021 Retinal detachment with multiple breaks, right eye: Secondary | ICD-10-CM

## 2020-07-29 DIAGNOSIS — Z09 Encounter for follow-up examination after completed treatment for conditions other than malignant neoplasm: Secondary | ICD-10-CM

## 2020-07-29 NOTE — Assessment & Plan Note (Signed)
The patient was found to be doing well, postoperatively, and was advised in the use of drops and home care.  Patient was advised not to rub eyes. Positioning was described as well, as precautions regarding intravitreal gas, if applicable. DO NOT TRAVEL TO [place], OR IN AIRPLANE, UNTIL THE BUBBLE INSIDE THE EYE HAS DISAPPEARED.  The use of eye patch at night is optional and was discussed. May use paper tape with patch if patient has dry skin and transpore tape for oily skin.   Use topical medications as ordered.  Patient instructed to look face down 1 hour at a time, for a total of 3 independent hours daily only while awake until Monday follow-up examination,   Patient instructed lay on left side to rest or sleep

## 2020-07-29 NOTE — Progress Notes (Signed)
07/29/2020     CHIEF COMPLAINT Patient presents for Post-op Follow-up (Postop day #1 status post scleral buckle, retinal cryopexy, injection vitreous substitute, SF 6 100%, 0.15 cc right eye)   HISTORY OF PRESENT ILLNESS: Erika [name] is a 55 y.o. female who presents to the clinic today for:   HPI    Post-op Follow-up    In right eye.  Vision is stable.  I, the attending physician,  performed the HPI with the patient and updated documentation appropriately. Additional comments: Postop day #1 status post scleral buckle, retinal cryopexy, injection vitreous substitute, SF 6 100%, 0.15 cc right eye       Last edited by Hurman Horn, MD on 07/29/2020  8:46 AM. (History)      Referring physician: No referring provider defined for this encounter.  HISTORICAL INFORMATION:   Selected notes from the MEDICAL RECORD NUMBER    Lab Results  Component Value Date   HGBA1C 10.2 10/17/2011     CURRENT MEDICATIONS: Current Outpatient Medications (Ophthalmic Drugs)  Medication Sig  . brimonidine (ALPHAGAN P) 0.1 % SOLN Place 1 drop into both eyes every morning.  . brimonidine (ALPHAGAN) 0.2 % ophthalmic solution Place 1 drop into both eyes 2 (two) times daily.  Marland Kitchen latanoprost (XALATAN) 0.005 % ophthalmic solution Place 1 drop into both eyes at bedtime.  Marland Kitchen ofloxacin (OCUFLOX) 0.3 % ophthalmic solution Place 1 drop into the right eye 4 (four) times daily for 10 days.  . prednisoLONE acetate (PRED FORTE) 1 % ophthalmic suspension Place 1 drop into the right eye 4 (four) times daily.   No current facility-administered medications for this visit. (Ophthalmic Drugs)   Current Outpatient Medications (Other)  Medication Sig  . albuterol (VENTOLIN HFA) 108 (90 Base) MCG/ACT inhaler Inhale 2 puffs into the lungs every 6 (six) hours as needed for wheezing or shortness of breath.  Marland Kitchen atorvastatin (LIPITOR) 10 MG tablet Take 10 mg by mouth at bedtime.  . BD PEN NEEDLE NANO 2ND GEN 32G X 4 MM MISC USE  TO ADMINSTER INSULIN ONCE A DAY SUBCUTANEOUS AS DIRECTED  . chlorhexidine (PERIDEX) 0.12 % solution SMARTSIG:0.5 By Mouth Twice Daily  . cholecalciferol (VITAMIN D) 1000 units tablet Take 1,000 Units by mouth daily.  Marland Kitchen FARXIGA 10 MG TABS tablet Take 10 mg by mouth daily.  . fexofenadine (ALLEGRA) 180 MG tablet Take 180 mg by mouth daily.  . flurbiprofen (ANSAID) 100 MG tablet Take 100 mg by mouth 2 (two) times daily.  . Insulin Glargine (BASAGLAR KWIKPEN) 100 UNIT/ML SOPN INJECT 100 UNITS (TITRATE AS DIRECTED, MAX DAILY DOSE OF 100 UNITS) ONCE A DAY  . levothyroxine (SYNTHROID, LEVOTHROID) 25 MCG tablet TAKE 1 TABLET BY MOUTH EVERY DAY  . metFORMIN (GLUCOPHAGE-XR) 500 MG 24 hr tablet Take 2 tablets (1,000 mg total) by mouth 2 (two) times daily.  . montelukast (SINGULAIR) 10 MG tablet Take 1 tablet (10 mg total) by mouth at bedtime.  . NONFORMULARY OR COMPOUNDED ITEM Antifungal solution: Terbinafine 3%, Fluconazole 2%, Tea Tree Oil 5%, Urea 10%, Ibuprofen 2% in DMSO suspension #59mL  . penicillin v potassium (VEETID) 500 MG tablet Take 500 mg by mouth 4 (four) times daily.  . polyethylene glycol powder (GLYCOLAX/MIRALAX) 17 GM/SCOOP powder Take 17 grams po prn  . pseudoephedrine (SUDAFED) 30 MG tablet Take by mouth.  . Spacer/Aero-Holding Chambers (AEROCHAMBER PLUS) inhaler Use as instructed to use with inahaler.  . SYMBICORT 80-4.5 MCG/ACT inhaler TAKE 2 PUFFS BY MOUTH TWICE A DAY  .  traMADol (ULTRAM) 50 MG tablet Take 50 mg by mouth every 6 (six) hours as needed.  . triamterene-hydrochlorothiazide (MAXZIDE-25) 37.5-25 MG tablet Take 1 tablet by mouth every morning.  . TRULICITY 4.09 WJ/1.9JY SOPN SMARTSIG:0.5 Milliliter(s) SUB-Q Once a Week  . VASCEPA 1 g capsule Take 1 g by mouth 2 (two) times daily.  Marland Kitchen XIGDUO XR 10-998 MG TB24 Take 1 tablet by mouth 2 (two) times daily.   No current facility-administered medications for this visit. (Other)      REVIEW OF  SYSTEMS:    ALLERGIES Allergies  Allergen Reactions  . Codeine Other (See Comments)    dizzy  . Latex Hives  . Other Other (See Comments)    Allergy to msg   Reaction- headache Allergy to saccharin-  Reaction- headache  . Benadryl [Diphenhydramine] Rash  . Nitrofurantoin Monohyd Macro Rash  . Sulfa Antibiotics Rash    PAST MEDICAL HISTORY Past Medical History:  Diagnosis Date  . Arthritis    hands, neck  . Asthma    no inhaler, no problems x 5 years.  . 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 Cervical dysplasia   . Depression    no med  . Diabetes mellitus without complication (HCC)    Type 2  . 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 Fibroid   . Goiter   . Heart murmur    never had any problems  . Hyperlipidemia   . Hypertension   . Hypothyroid   . Infertility, female   . 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 Neuropathy    located fingers and toes  . Ocular hypertension   . PCOS (polycystic ovarian syndrome)   . Seasonal allergies   . Umbilical hernia   . Urinary incontinence    Past Surgical History:  Procedure Laterality Date  . BACK SURGERY  12/1997,01/12/1998,01/20/1998   x 3 surgeries - L4/L5/L6  . BREAST BIOPSY Right 12/14/2004  . COLPOSCOPY    . DILATATION & CURETTAGE/HYSTEROSCOPY WITH MYOSURE N/A 12/14/2014   Procedure: DILATATION & CURETTAGE/HYSTEROSCOPY WITH MYOSURE;  Surgeon: [name], MD;  Location: [facility];  Service: Gynecology;  Laterality: N/A;  to follow 2nd case 10:00?  needs one hour OR time  . DILATION AND CURETTAGE OF UTERUS  2010   endometrial polyp  . HYSTEROSCOPY  2004  . MYOMECTOMY  2004  . OVARY SURGERY  09/1998   bilateral ovarian transposition due to radiation treatment for spine lesion  . WISDOM TOOTH EXTRACTION      FAMILY HISTORY Family History  Problem Relation Age of Onset  . Lung cancer Mother   . Hypertension Maternal Aunt   .  Breast cancer Maternal Aunt        Age 36  . Thyroid disease Maternal Aunt   . Colon cancer Maternal Aunt   . Lung cancer Maternal Uncle   . Esophageal cancer Maternal Uncle   . Cirrhosis Maternal Aunt   . Colon cancer Maternal Aunt   . Throat cancer Brother     SOCIAL HISTORY Social History   Tobacco Use  . Smoking status: Former Smoker    Packs/day: 0.25    Years: 9.00    Pack years: 2.25    Types: Cigarettes    Quit date: 08/22/1985    Years since quitting: 34.9  . Smokeless tobacco: Never Used  Vaping Use  . Vaping Use: Never  used  Substance Use Topics  . Alcohol use: Yes    Alcohol/week: 0.0 standard drinks    Comment: rare wine  . Drug use: No         OPHTHALMIC EXAM:  Base Eye Exam    Visual Acuity (ETDRS)      Right Left   Dist [place] 20/50    Dist ph [place] 20/30        Tonometry (Palpation, 8:56 AM)      Right Left   Pressure 15         Slit Lamp and Fundus Exam    External Exam      Right Left   External Periorbital edema, Ecchymosis        Slit Lamp Exam      Right Left   Lids/Lashes Ecchymosis    Conjunctiva/Sclera 3+ Injection, 1+ Chemosis    Cornea Clear    Anterior Chamber Deep and quiet    Iris Round and reactive    Lens 1+ Nuclear sclerosis    Anterior Vitreous Normal        Fundus Exam      Right Left   Posterior Vitreous 8-10% gas    Disc Normal, nerve pink, vessels perfused    C/D Ratio 0.2    Macula Macula attached    Vessels Normal    Periphery Scleral buckle temporal, good retinopexy, cryo reaction, attached lately           IMAGING AND PROCEDURES  Imaging and Procedures for 07/29/20           ASSESSMENT/PLAN:  Postoperative follow-up The patient was found to be doing well, postoperatively, and was advised in the use of drops and home care.  Patient was advised not to rub eyes. Positioning was described as well, as precautions regarding intravitreal gas, if applicable. DO NOT TRAVEL TO MOUNTAINS, OR IN  AIRPLANE, UNTIL THE BUBBLE INSIDE THE EYE HAS DISAPPEARED.  The use of eye patch at night is optional and was discussed. May use paper tape with patch if patient has dry skin and transpore tape for oily skin.   Use topical medications as ordered.  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  Excellent visual acuity.  Patient instructed of the appearance of the external eye has no bearing on the internal healing that is occurring.  Informed her that the swelling in and around the eye and the eyelid is normal for this early post surgery.  I did offer the patient may use turmeric 1 tablet daily which may with the element of curcumin may have some beneficial effect on anti-inflammatory  scarring effects long-term.  She may require this at nutrition stores or pharmacies over-the-counter      ICD-10-CM   1. Postoperative follow-up  Z09   2. 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  Patient instructed to commence with topical eye medications as outlined below and instructions  2.  Patient informed the retina is reattached completely and is off to a great start with the healing process.  Explained the patient that healing is not an event but is in fact a process over time  3.  Ophthalmic  Meds Ordered this visit:  No orders of the defined types were placed in this encounter.      Return in about 3 days (around 08/01/2020) for At 0 800 or 0 830, POST OP, COLOR FP, dilate, OD.  Patient Instructions  Positioning reviewed with the patient, see postop instructions above  Patient may use topical eyewash to keep the eye lashes from sticking shut  And asked to commence with topical eye  medications  Ofloxacin 1 drop right eye 4 times daily,  Prednisolone acetate 1 drop right eye 4 times daily  Patient may resume in the right eye brimonidine 1 drop right eye 2 or 3 times daily for previous glaucoma  Right eye, patient instructed not to use latanoprost at night    Left eye patient instructed to continue her previously prescribed medications, by Dr. Posey Pronto for glaucoma    Explained the diagnoses, plan, and follow up with the patient and they expressed understanding.  Patient expressed understanding of the importance of proper follow up care.   Clent Demark Inger Wiest M.D. Diseases & Surgery of the Retina and Vitreous [facility]ina [facility] Diabetic [place] 07/29/20     Abbreviations: M myopia (nearsighted); A astigmatism; H hyperopia (farsighted); P presbyopia; Mrx spectacle prescription;  CTL contact lenses; OD right eye; OS left eye; OU both eyes  XT exotropia; ET esotropia; PEK punctate epithelial keratitis; PEE punctate epithelial erosions; DES dry eye syndrome; MGD meibomian gland dysfunction; ATs artificial tears; PFAT's preservative free artificial tears; [place] nuclear sclerotic cataract; PSC posterior subcapsular cataract; ERM epi-retinal membrane; PVD posterior vitreous detachment; RD retinal detachment; DM diabetes mellitus; DR diabetic retinopathy; NPDR non-proliferative diabetic retinopathy; PDR proliferative diabetic retinopathy; CSME clinically significant macular edema; DME diabetic macular edema; dbh dot blot hemorrhages; CWS cotton wool spot; POAG primary open angle glaucoma; C/D cup-to-disc ratio; HVF humphrey visual field; GVF goldmann visual field; OCT optical coherence tomography; IOP intraocular pressure; BRVO Branch retinal vein occlusion; CRVO central retinal vein occlusion; CRAO central retinal artery occlusion; BRAO branch retinal artery occlusion; RT retinal tear; SB scleral buckle; PPV pars plana vitrectomy; VH Vitreous hemorrhage; PRP panretinal laser photocoagulation;  IVK intravitreal kenalog; VMT vitreomacular traction; MH Macular hole;  NVD neovascularization of the disc; NVE neovascularization elsewhere; AREDS age related eye disease study; ARMD age related macular degeneration; POAG primary open angle glaucoma; EBMD epithelial/anterior basement membrane dystrophy; ACIOL anterior chamber intraocular lens; IOL intraocular lens; PCIOL posterior chamber intraocular lens; Phaco/IOL phacoemulsification with intraocular lens placement; [place] photorefractive keratectomy; LASIK laser assisted in situ keratomileusis; HTN hypertension; DM diabetes mellitus; COPD chronic obstructive pulmonary disease

## 2020-07-29 NOTE — Assessment & Plan Note (Signed)
Postop day #1 looks great, retina completely attached.  Excellent visual acuity.  Patient instructed of the appearance of the external eye has no bearing on the internal healing that is occurring.  Informed her that the swelling in and around the eye and the eyelid is normal for this early post surgery.  I did offer the patient may use turmeric 1 tablet daily which may with the element of curcumin may have some beneficial effect on anti-inflammatory  scarring effects long-term.  She may require this at nutrition stores or pharmacies over-the-counter

## 2020-07-29 NOTE — Patient Instructions (Signed)
Positioning reviewed with the patient, see postop instructions above  Patient may use topical eyewash to keep the eye lashes from sticking shut  And asked to commence with topical eye medications  Ofloxacin 1 drop right eye 4 times daily,  Prednisolone acetate 1 drop right eye 4 times daily  Patient may resume in the right eye brimonidine 1 drop right eye 2 or 3 times daily for previous glaucoma  Right eye, patient instructed not to use latanoprost at night    Left eye patient instructed to continue her previously prescribed medications, by Dr. Posey Pronto for glaucoma

## 2020-08-01 ENCOUNTER — Encounter (INDEPENDENT_AMBULATORY_CARE_PROVIDER_SITE_OTHER): Payer: Self-pay | Admitting: Ophthalmology

## 2020-08-01 ENCOUNTER — Ambulatory Visit (INDEPENDENT_AMBULATORY_CARE_PROVIDER_SITE_OTHER): Payer: BC Managed Care – PPO | Admitting: Ophthalmology

## 2020-08-01 ENCOUNTER — Other Ambulatory Visit: Payer: Self-pay

## 2020-08-01 DIAGNOSIS — Z09 Encounter for follow-up examination after completed treatment for conditions other than malignant neoplasm: Secondary | ICD-10-CM | POA: Diagnosis not present

## 2020-08-01 DIAGNOSIS — H33021 Retinal detachment with multiple breaks, right eye: Secondary | ICD-10-CM

## 2020-08-01 NOTE — Patient Instructions (Signed)
Patient instructed to continue topical eyedrops right eye, ofloxacin 1 drop right eye 4 times daily as well as prednisolone acetate 1 drop right eye 4 times daily and use brimonidine glaucoma drops as previously instructed in the right eye     Left eye  Patient instructed to continue on her glaucoma eyedrops in the left eye as is been prescribed and utilized in the past without change

## 2020-08-01 NOTE — Progress Notes (Signed)
08/01/2020     CHIEF COMPLAINT Patient presents for Post-op Follow-up ( 4 Day PO SCLERAL BUCKLE CRYOPEXY OD INJECTION SF 6 , OD///Pt reports some "hair floaters", a flash when opening eye, some discharge, some pain or feels like a bruise, and a headache OD. Pt denies any pressure. Pt reports eye feels better after being able to irrigate with solution.///Last BS: unsure, hasn't checked recently     )   HISTORY OF PRESENT ILLNESS: Erika [name] is a 55 y.o. female who presents to the clinic today for:   HPI    Post-op Follow-up    In right eye.  Discomfort includes discharge. Additional comments:  4 Day PO SCLERAL BUCKLE CRYOPEXY OD INJECTION SF 6 , OD   Pt reports some "hair floaters", a flash when opening eye, some discharge, some pain or feels like a bruise, and a headache OD. Pt denies any pressure. Pt reports eye feels better after being able to irrigate with solution.   Last BS: unsure, hasn't checked recently            Last edited by Hurman Horn, MD on 08/01/2020  8:25 AM. (History)      Referring physician: [place], Betty G, MD [place],  [place] [id number]  HISTORICAL INFORMATION:   Selected notes from the MEDICAL RECORD NUMBER    Lab Results  Component Value Date   HGBA1C 10.2 10/17/2011     CURRENT MEDICATIONS: Current Outpatient Medications (Ophthalmic Drugs)  Medication Sig  . latanoprost (XALATAN) 0.005 % ophthalmic solution Place 1 drop into both eyes at bedtime.  Marland Kitchen ofloxacin (OCUFLOX) 0.3 % ophthalmic solution Place 1 drop into the right eye 4 (four) times daily for 10 days.  . prednisoLONE acetate (PRED FORTE) 1 % ophthalmic suspension Place 1 drop into the right eye 4 (four) times daily.  . brimonidine (ALPHAGAN P) 0.1 % SOLN Place 1 drop into both eyes every morning.  . brimonidine (ALPHAGAN) 0.2 % ophthalmic solution Place 1 drop into both eyes 2 (two) times daily. (Patient taking differently: Place 1 drop into both eyes 3 (three)  times daily.)   No current facility-administered medications for this visit. (Ophthalmic Drugs)   Current Outpatient Medications (Other)  Medication Sig  . albuterol (VENTOLIN HFA) 108 (90 Base) MCG/ACT inhaler Inhale 2 puffs into the lungs every 6 (six) hours as needed for wheezing or shortness of breath.  Marland Kitchen atorvastatin (LIPITOR) 10 MG tablet Take 10 mg by mouth at bedtime.  . BD PEN NEEDLE NANO 2ND GEN 32G X 4 MM MISC USE TO ADMINSTER INSULIN ONCE A DAY SUBCUTANEOUS AS DIRECTED  . chlorhexidine (PERIDEX) 0.12 % solution SMARTSIG:0.5 By Mouth Twice Daily  . cholecalciferol (VITAMIN D) 1000 units tablet Take 1,000 Units by mouth daily.  Marland Kitchen FARXIGA 10 MG TABS tablet Take 10 mg by mouth daily.  . fexofenadine (ALLEGRA) 180 MG tablet Take 180 mg by mouth daily.  . flurbiprofen (ANSAID) 100 MG tablet Take 100 mg by mouth 2 (two) times daily.  . Insulin Glargine (BASAGLAR KWIKPEN) 100 UNIT/ML SOPN INJECT 100 UNITS (TITRATE AS DIRECTED, MAX DAILY DOSE OF 100 UNITS) ONCE A DAY  . levothyroxine (SYNTHROID, LEVOTHROID) 25 MCG tablet TAKE 1 TABLET BY MOUTH EVERY DAY  . metFORMIN (GLUCOPHAGE-XR) 500 MG 24 hr tablet Take 2 tablets (1,000 mg total) by mouth 2 (two) times daily.  . montelukast (SINGULAIR) 10 MG tablet Take 1 tablet (10 mg total) by mouth at bedtime.  Marland Kitchen  NONFORMULARY OR COMPOUNDED ITEM Antifungal solution: Terbinafine 3%, Fluconazole 2%, Tea Tree Oil 5%, Urea 10%, Ibuprofen 2% in DMSO suspension #61mL  . penicillin v potassium (VEETID) 500 MG tablet Take 500 mg by mouth 4 (four) times daily.  . polyethylene glycol powder (GLYCOLAX/MIRALAX) 17 GM/SCOOP powder Take 17 grams po prn  . pseudoephedrine (SUDAFED) 30 MG tablet Take by mouth.  . Spacer/Aero-Holding Chambers (AEROCHAMBER PLUS) inhaler Use as instructed to use with inahaler.  . SYMBICORT 80-4.5 MCG/ACT inhaler TAKE 2 PUFFS BY MOUTH TWICE A DAY  . traMADol (ULTRAM) 50 MG tablet Take 50 mg by mouth every 6 (six) hours as needed.  .  triamterene-hydrochlorothiazide (MAXZIDE-25) 37.5-25 MG tablet Take 1 tablet by mouth every morning.  . TRULICITY 3.22 GU/5.4YH SOPN SMARTSIG:0.5 Milliliter(s) SUB-Q Once a Week  . VASCEPA 1 g capsule Take 1 g by mouth 2 (two) times daily.  Marland Kitchen XIGDUO XR 10-998 MG TB24 Take 1 tablet by mouth 2 (two) times daily.   No current facility-administered medications for this visit. (Other)      REVIEW OF SYSTEMS:    ALLERGIES Allergies  Allergen Reactions  . Codeine Other (See Comments)    dizzy  . Latex Hives  . Other Other (See Comments)    Allergy to msg   Reaction- headache Allergy to saccharin-  Reaction- headache  . Benadryl [Diphenhydramine] Rash  . Nitrofurantoin Monohyd Macro Rash  . Sulfa Antibiotics Rash    PAST MEDICAL HISTORY Past Medical History:  Diagnosis Date  . Arthritis    hands, neck  . Asthma    no inhaler, no problems x 5 years.  . 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 Cervical dysplasia   . Depression    no med  . Diabetes mellitus without complication (HCC)    Type 2  . 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 Fibroid   . Goiter   . Heart murmur    never had any problems  . Hyperlipidemia   . Hypertension   . Hypothyroid   . Infertility, female   . 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 Neuropathy    located fingers and toes  . Ocular hypertension   . PCOS (polycystic ovarian syndrome)   . Seasonal allergies   . Umbilical hernia   . Urinary incontinence    Past Surgical History:  Procedure Laterality Date  . BACK SURGERY  12/1997,01/12/1998,01/20/1998   x 3 surgeries - L4/L5/L6  . BREAST BIOPSY Right 12/14/2004  . COLPOSCOPY    . DILATATION & CURETTAGE/HYSTEROSCOPY WITH MYOSURE N/A 12/14/2014   Procedure: DILATATION & CURETTAGE/HYSTEROSCOPY WITH MYOSURE;  Surgeon: [name], MD;  Location: [facility];  Service: Gynecology;  Laterality: N/A;  to follow 2nd case  10:00?  needs one hour OR time  . DILATION AND CURETTAGE OF UTERUS  2010   endometrial polyp  . HYSTEROSCOPY  2004  . MYOMECTOMY  2004  . OVARY SURGERY  09/1998   bilateral ovarian transposition due to radiation treatment for spine lesion  . WISDOM TOOTH EXTRACTION      FAMILY HISTORY Family History  Problem Relation Age of Onset  . Lung cancer Mother   . Hypertension Maternal Aunt   . Breast cancer Maternal Aunt        Age 65  . Thyroid disease Maternal Aunt   . Colon cancer Maternal Aunt   . Lung cancer  Maternal Uncle   . Esophageal cancer Maternal Uncle   . Cirrhosis Maternal Aunt   . Colon cancer Maternal Aunt   . Throat cancer Brother     SOCIAL HISTORY Social History   Tobacco Use  . Smoking status: Former Smoker    Packs/day: 0.25    Years: 9.00    Pack years: 2.25    Types: Cigarettes    Quit date: 08/22/1985    Years since quitting: 34.9  . Smokeless tobacco: Never Used  Vaping Use  . Vaping Use: Never used  Substance Use Topics  . Alcohol use: Yes    Alcohol/week: 0.0 standard drinks    Comment: rare wine  . Drug use: No         OPHTHALMIC EXAM:  Base Eye Exam    Visual Acuity (ETDRS)      Right Left   Dist [place] 20/400 20/100 +1   Dist ph [place] 20/60 -2 20/40 -1       Tonometry (Tonopen, 8:16 AM)      Right Left   Pressure 23 24       Pupils      Dark Light Shape React APD   Right 4 4 Round Brisk None   Left 4 4 Round Brisk None       Visual Fields (Counting fingers)      Left Right    Full    Restrictions  Total inferior temporal deficiency       Extraocular Movement      Right Left    Full Full       Neuro/Psych    Oriented x3: Yes       Dilation    Right eye: 1.0% Mydriacyl, 2.5% Phenylephrine @ 8:16 AM        Slit Lamp and Fundus Exam    External Exam      Right Left   External Periorbital edema, Ecchymosis        Slit Lamp Exam      Right Left   Lids/Lashes Ecchymosis    Conjunctiva/Sclera 2+ Injection, 1+  Chemosis    Cornea Clear    Anterior Chamber Deep and quiet    Iris Round and reactive    Lens 1+ Nuclear sclerosis    Anterior Vitreous Normal        Fundus Exam      Right Left   Posterior Vitreous Normal, gas 2%    Disc Normal, nerve pink, vessels perfused    C/D Ratio 0.3    Macula Macula attached    Vessels Normal    Periphery Scleral buckle temporal, good retinopexy, cryo reaction superotemporal quadrant on buckle, attached, no subretinal fluid           IMAGING AND PROCEDURES  Imaging and Procedures for 08/01/20  Color Fundus Photography Optos - OU - Both Eyes       Right Eye Progression has improved. Disc findings include normal observations. Macula : normal observations. Vessels : normal observations. Periphery : detachment.   Left Eye Progression has no prior data. Macula : normal observations. Vessels : normal observations.   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 Postoperative follow-up  Z09 Color Fundus Photography  Optos - OU - Both Eyes  2. 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  Retina attached, good scleral buckle temporally, clear media.  Gas bubble is nearly dissolved.  Patient does not require any further postoperative positioning.  I will instruct her to not sleep on her back until the bubble is gone.  At which time the green bracelet can be removed.  2.  I have not encouraged the patient to return to work yet the patient believes that visually and comfort why she can handle work commencing in 8 days, that is 08-08-20.  I have reminded her that surface irritations and discomfort on the right I shall continue probably for the next 2 to 3 weeks and final visual acuity recover will likely require refractive  assistance.  Nonetheless the blurred vision the right I will no way harm or strain the left eye but it will offer some confusion to the brain, which processes these visual symptoms. 3.  Physically the patient may return slowly to normal and even light exercise activity commencing in 10 days from now.  #4 I did explain the patient also that refractive change should not be measured prior to 4 weeks from now.  #5.  Patient will continue on brimonidine topically in the right eye as well as ofloxacin 4 times daily and Pred forte 4 times daily for the next 2 weeks   Right eye, patient may also restart latanoprost nightly in the right eye on   08-08-2020, Monday. Ophthalmic Meds Ordered this visit:  No orders of the defined types were placed in this encounter.      Return in about 2 weeks (around 08/15/2020) for POST OP, OD, OCT, COLOR FP.  Patient Instructions  Patient instructed to continue topical eyedrops right eye, ofloxacin 1 drop right eye 4 times daily as well as prednisolone acetate 1 drop right eye 4 times daily and use brimonidine glaucoma drops as previously instructed in the right eye     Left eye  Patient instructed to continue on her glaucoma eyedrops in the left eye as is been prescribed and utilized in the past without change    Explained the diagnoses, plan, and follow up with the patient and they expressed understanding.  Patient expressed understanding of the importance of proper follow up care.   Clent Demark Jochebed Bills M.D. Diseases & Surgery of the Retina and Vitreous [facility] [place] 08/01/20     Abbreviations: M myopia (nearsighted); A astigmatism; H hyperopia (farsighted); P presbyopia; Mrx spectacle prescription;  CTL contact lenses; OD right eye; OS left eye; OU both eyes  XT exotropia; ET esotropia; PEK punctate epithelial keratitis; PEE punctate epithelial erosions; DES dry eye syndrome; MGD meibomian gland dysfunction; ATs artificial tears; PFAT's  preservative free artificial tears; [place] nuclear sclerotic cataract; PSC posterior subcapsular cataract; ERM epi-retinal membrane; PVD posterior vitreous detachment; RD retinal detachment; DM diabetes mellitus; DR diabetic retinopathy; NPDR non-proliferative diabetic retinopathy; PDR proliferative diabetic retinopathy; CSME clinically significant macular edema; DME diabetic macular edema; dbh dot blot hemorrhages; CWS cotton wool spot; POAG primary open angle glaucoma; C/D cup-to-disc ratio; HVF humphrey visual field; GVF goldmann visual field; OCT optical coherence tomography; IOP intraocular pressure; BRVO Branch retinal vein occlusion; CRVO central retinal vein occlusion; CRAO central retinal artery occlusion; BRAO branch retinal artery occlusion; RT retinal tear; SB scleral buckle; PPV pars plana vitrectomy; VH Vitreous hemorrhage; PRP panretinal laser photocoagulation; IVK intravitreal kenalog; VMT vitreomacular traction; MH  Macular hole;  NVD neovascularization of the disc; NVE neovascularization elsewhere; AREDS age related eye disease study; ARMD age related macular degeneration; POAG primary open angle glaucoma; EBMD epithelial/anterior basement membrane dystrophy; ACIOL anterior chamber intraocular lens; IOL intraocular lens; PCIOL posterior chamber intraocular lens; Phaco/IOL phacoemulsification with intraocular lens placement; [place] photorefractive keratectomy; LASIK laser assisted in situ keratomileusis; HTN hypertension; DM diabetes mellitus; COPD chronic obstructive pulmonary disease

## 2020-08-01 NOTE — Assessment & Plan Note (Signed)
Postop day #4, scleral buckle injection gas, retina attached

## 2020-08-08 ENCOUNTER — Encounter (INDEPENDENT_AMBULATORY_CARE_PROVIDER_SITE_OTHER): Payer: Self-pay

## 2020-08-11 ENCOUNTER — Encounter (INDEPENDENT_AMBULATORY_CARE_PROVIDER_SITE_OTHER): Payer: Self-pay

## 2020-08-15 ENCOUNTER — Ambulatory Visit (INDEPENDENT_AMBULATORY_CARE_PROVIDER_SITE_OTHER): Payer: BC Managed Care – PPO | Admitting: Ophthalmology

## 2020-08-15 ENCOUNTER — Encounter (INDEPENDENT_AMBULATORY_CARE_PROVIDER_SITE_OTHER): Payer: Self-pay | Admitting: Ophthalmology

## 2020-08-15 ENCOUNTER — Encounter (INDEPENDENT_AMBULATORY_CARE_PROVIDER_SITE_OTHER): Payer: BC Managed Care – PPO | Admitting: Ophthalmology

## 2020-08-15 ENCOUNTER — Other Ambulatory Visit: Payer: Self-pay

## 2020-08-15 DIAGNOSIS — H33021 Retinal detachment with multiple breaks, right eye: Secondary | ICD-10-CM

## 2020-08-15 NOTE — Progress Notes (Signed)
08/15/2020     CHIEF COMPLAINT Patient presents for Post-op Follow-up (2 week s/p Scleral buckle Cryopexy OD. OCT and FP/Pt c/o occasional sharp pain in OD. Pt states pain lasts about 1 min then goes away. Pt sees a floater in OD vision. Pt also sees a white spot in inferotemporal quad OD. Pt c/o a foreign body sensation in OD as well. Using gtts as directed.)   HISTORY OF PRESENT ILLNESS: Erika [name] is a 55 y.o. female who presents to the clinic today for:   HPI    Post-op Follow-up    In right eye.  Discomfort includes pain, foreign body sensation and floaters.  Vision is stable.  I, the attending physician,  performed the HPI with the patient and updated documentation appropriately. Additional comments: 2 week s/p Scleral buckle Cryopexy OD. OCT and FP Pt c/o occasional sharp pain in OD. Pt states pain lasts about 1 min then goes away. Pt sees a floater in OD vision. Pt also sees a white spot in inferotemporal quad OD. Pt c/o a foreign body sensation in OD as well. Using gtts as directed.       Last edited by Tilda Franco on 08/15/2020 10:22 AM. (History)      Referring physician: Martinique, Betty G, MD [place],  [place] [id number]  HISTORICAL INFORMATION:   Selected notes from the MEDICAL RECORD NUMBER    Lab Results  Component Value Date   HGBA1C 10.2 10/17/2011     CURRENT MEDICATIONS: Current Outpatient Medications (Ophthalmic Drugs)  Medication Sig  . brimonidine (ALPHAGAN P) 0.1 % SOLN Place 1 drop into both eyes every morning.  . brimonidine (ALPHAGAN) 0.2 % ophthalmic solution Place 1 drop into both eyes 2 (two) times daily. (Patient taking differently: Place 1 drop into both eyes 3 (three) times daily.)  . latanoprost (XALATAN) 0.005 % ophthalmic solution Place 1 drop into both eyes at bedtime.  . prednisoLONE acetate (PRED FORTE) 1 % ophthalmic suspension Place 1 drop into the right eye 4 (four) times daily.   No current  facility-administered medications for this visit. (Ophthalmic Drugs)   Current Outpatient Medications (Other)  Medication Sig  . albuterol (VENTOLIN HFA) 108 (90 Base) MCG/ACT inhaler Inhale 2 puffs into the lungs every 6 (six) hours as needed for wheezing or shortness of breath.  Marland Kitchen atorvastatin (LIPITOR) 10 MG tablet Take 10 mg by mouth at bedtime.  . BD PEN NEEDLE NANO 2ND GEN 32G X 4 MM MISC USE TO ADMINSTER INSULIN ONCE A DAY SUBCUTANEOUS AS DIRECTED  . chlorhexidine (PERIDEX) 0.12 % solution SMARTSIG:0.5 By Mouth Twice Daily  . cholecalciferol (VITAMIN D) 1000 units tablet Take 1,000 Units by mouth daily.  Marland Kitchen FARXIGA 10 MG TABS tablet Take 10 mg by mouth daily.  . fexofenadine (ALLEGRA) 180 MG tablet Take 180 mg by mouth daily.  . flurbiprofen (ANSAID) 100 MG tablet Take 100 mg by mouth 2 (two) times daily.  . Insulin Glargine (BASAGLAR KWIKPEN) 100 UNIT/ML SOPN INJECT 100 UNITS (TITRATE AS DIRECTED, MAX DAILY DOSE OF 100 UNITS) ONCE A DAY  . levothyroxine (SYNTHROID, LEVOTHROID) 25 MCG tablet TAKE 1 TABLET BY MOUTH EVERY DAY  . metFORMIN (GLUCOPHAGE-XR) 500 MG 24 hr tablet Take 2 tablets (1,000 mg total) by mouth 2 (two) times daily.  . montelukast (SINGULAIR) 10 MG tablet Take 1 tablet (10 mg total) by mouth at bedtime.  . NONFORMULARY OR COMPOUNDED ITEM Antifungal solution: Terbinafine 3%, Fluconazole 2%, Tea Tree  Oil 5%, Urea 10%, Ibuprofen 2% in DMSO suspension #57mL  . penicillin v potassium (VEETID) 500 MG tablet Take 500 mg by mouth 4 (four) times daily.  . polyethylene glycol powder (GLYCOLAX/MIRALAX) 17 GM/SCOOP powder Take 17 grams po prn  . pseudoephedrine (SUDAFED) 30 MG tablet Take by mouth.  . Spacer/Aero-Holding Chambers (AEROCHAMBER PLUS) inhaler Use as instructed to use with inahaler.  . SYMBICORT 80-4.5 MCG/ACT inhaler TAKE 2 PUFFS BY MOUTH TWICE A DAY  . traMADol (ULTRAM) 50 MG tablet Take 50 mg by mouth every 6 (six) hours as needed.  . triamterene-hydrochlorothiazide  (MAXZIDE-25) 37.5-25 MG tablet Take 1 tablet by mouth every morning.  . TRULICITY 9.14 NW/2.9FA SOPN SMARTSIG:0.5 Milliliter(s) SUB-Q Once a Week  . VASCEPA 1 g capsule Take 1 g by mouth 2 (two) times daily.  Marland Kitchen XIGDUO XR 10-998 MG TB24 Take 1 tablet by mouth 2 (two) times daily.   No current facility-administered medications for this visit. (Other)      REVIEW OF SYSTEMS:    ALLERGIES Allergies  Allergen Reactions  . Codeine Other (See Comments)    dizzy  . Latex Hives  . Other Other (See Comments)    Allergy to msg   Reaction- headache Allergy to saccharin-  Reaction- headache  . Benadryl [Diphenhydramine] Rash  . Nitrofurantoin Monohyd Macro Rash  . Sulfa Antibiotics Rash    PAST MEDICAL HISTORY Past Medical History:  Diagnosis Date  . Arthritis    hands, neck  . Asthma    no inhaler, no problems x 5 years.  . 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 Cervical dysplasia   . Depression    no med  . Diabetes mellitus without complication (HCC)    Type 2  . 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 Fibroid   . Goiter   . Heart murmur    never had any problems  . Hyperlipidemia   . Hypertension   . Hypothyroid   . Infertility, female   . 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 Neuropathy    located fingers and toes  . Ocular hypertension   . PCOS (polycystic ovarian syndrome)   . Seasonal allergies   . Umbilical hernia   . Urinary incontinence    Past Surgical History:  Procedure Laterality Date  . BACK SURGERY  12/1997,01/12/1998,01/20/1998   x 3 surgeries - L4/L5/L6  . BREAST BIOPSY Right 12/14/2004  . COLPOSCOPY    . DILATATION & CURETTAGE/HYSTEROSCOPY WITH MYOSURE N/A 12/14/2014   Procedure: DILATATION & CURETTAGE/HYSTEROSCOPY WITH MYOSURE;  Surgeon: [name], MD;  Location: [facility];  Service: Gynecology;  Laterality: N/A;  to follow 2nd case 10:00?  needs one hour OR  time  . DILATION AND CURETTAGE OF UTERUS  2010   endometrial polyp  . HYSTEROSCOPY  2004  . MYOMECTOMY  2004  . OVARY SURGERY  09/1998   bilateral ovarian transposition due to radiation treatment for spine lesion  . WISDOM TOOTH EXTRACTION      FAMILY HISTORY Family History  Problem Relation Age of Onset  . Lung cancer Mother   . Hypertension Maternal Aunt   . Breast cancer Maternal Aunt        Age 68  . Thyroid disease Maternal Aunt   . Colon cancer Maternal Aunt   . Lung cancer Maternal Uncle   . Esophageal cancer Maternal Uncle   .  Cirrhosis Maternal Aunt   . Colon cancer Maternal Aunt   . Throat cancer Brother     SOCIAL HISTORY Social History   Tobacco Use  . Smoking status: Former Smoker    Packs/day: 0.25    Years: 9.00    Pack years: 2.25    Types: Cigarettes    Quit date: 08/22/1985    Years since quitting: 35.0  . Smokeless tobacco: Never Used  Vaping Use  . Vaping Use: Never used  Substance Use Topics  . Alcohol use: Yes    Alcohol/week: 0.0 standard drinks    Comment: rare wine  . Drug use: No         OPHTHALMIC EXAM:  Base Eye Exam    Visual Acuity (Snellen - Linear)      Right Left   Dist cc 20/25 -1 20/20 -2   Correction: Glasses       Tonometry (Tonopen, 10:27 AM)      Right Left   Pressure 22 21       Pupils      Pupils Dark Light Shape React APD   Right PERRL 4 3 Round Brisk None   Left PERRL 4 3 Round Brisk None       Neuro/Psych    Oriented x3: Yes   Mood/Affect: Normal       Dilation    Right eye: 1.0% Mydriacyl, 2.5% Phenylephrine @ 10:27 AM        Slit Lamp and Fundus Exam    External Exam      Right Left   External Periorbital edema, Ecchymosis        Slit Lamp Exam      Right Left   Lids/Lashes Ecchymosis    Conjunctiva/Sclera 2+ Injection, 1+ Chemosis    Cornea Clear    Anterior Chamber Deep and quiet    Iris Round and reactive    Lens 1+ Nuclear sclerosis    Anterior Vitreous Normal        Fundus  Exam      Right Left   Posterior Vitreous Normal,     Disc Normal, nerve pink, vessels perfused    C/D Ratio 0.3    Macula Macula attached    Vessels Normal    Periphery Scleral buckle temporal, good retinopexy, cryo reaction superotemporal quadrant on buckle, attached, no subretinal fluid           IMAGING AND PROCEDURES  Imaging and Procedures for 08/15/20  OCT, Retina - OU - Both Eyes       Right Eye Quality was good. Scan locations included subfoveal. Central Foveal Thickness: 239. Findings include abnormal foveal contour.   Left Eye Quality was good. Scan locations included subfoveal. Central Foveal Thickness: 232. Progression has no prior data.   Notes Loculated subretinal fluid, not in the fovea, temporal representing prior macular threatening temporal detachment OD, will continue to observe       Color Fundus Photography Optos - OU - Both Eyes       Right Eye Progression has improved. Macula : normal observations. Vessels : normal observations.   Left Eye Progression has been stable. Disc findings include normal observations.   Notes Retina attached 360, good temporal scleral buckle.  OD.                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  Retina attached nicely  Patient continues to have topical surface irritation from dissolvable stitches, these findings were reviewed with the patient and they tend to last 6 to 8 weeks.  Of asked the patient not to consider use of contact lens in this right eye until those stitches have diminished and we will follow the patient now in 4 weeks to monitor for this      ICD-10-CM   1. 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  Patient will continue to  complete current topical eyedrops right eye.  Patient to discontinue the current prednisolone acetate when completed.  2.  3.  Ophthalmic Meds Ordered this visit:  No orders of the defined types were placed in this encounter.      Return in about 3 weeks (around 09/05/2020) for dilate, OD, COLOR FP, OCT, POST OP.  There are no Patient Instructions on file for this visit.   Explained the diagnoses, plan, and follow up with the patient and they expressed understanding.  Patient expressed understanding of the importance of proper follow up care.   Clent Demark Rankin M.D. Diseases & Surgery of the Retina and Vitreous [facility] [place] 08/15/20     Abbreviations: M myopia (nearsighted); A astigmatism; H hyperopia (farsighted); P presbyopia; Mrx spectacle prescription;  CTL contact lenses; OD right eye; OS left eye; OU both eyes  XT exotropia; ET esotropia; PEK punctate epithelial keratitis; PEE punctate epithelial erosions; DES dry eye syndrome; MGD meibomian gland dysfunction; ATs artificial tears; PFAT's preservative free artificial tears; [place] nuclear sclerotic cataract; PSC posterior subcapsular cataract; ERM epi-retinal membrane; PVD posterior vitreous detachment; RD retinal detachment; DM diabetes mellitus; DR diabetic retinopathy; NPDR non-proliferative diabetic retinopathy; PDR proliferative diabetic retinopathy; CSME clinically significant macular edema; DME diabetic macular edema; dbh dot blot hemorrhages; CWS cotton wool spot; POAG primary open angle glaucoma; C/D cup-to-disc ratio; HVF humphrey visual field; GVF goldmann visual field; OCT optical coherence tomography; IOP intraocular pressure; BRVO Branch retinal vein occlusion; CRVO central retinal vein occlusion; CRAO central retinal artery occlusion; BRAO branch retinal artery occlusion; RT retinal tear; SB scleral buckle; PPV pars plana vitrectomy; VH Vitreous hemorrhage; PRP panretinal laser photocoagulation; IVK  intravitreal kenalog; VMT vitreomacular traction; MH Macular hole;  NVD neovascularization of the disc; NVE neovascularization elsewhere; AREDS age related eye disease study; ARMD age related macular degeneration; POAG primary open angle glaucoma; EBMD epithelial/anterior basement membrane dystrophy; ACIOL anterior chamber intraocular lens; IOL intraocular lens; PCIOL posterior chamber intraocular lens; Phaco/IOL phacoemulsification with intraocular lens placement; Kenneth photorefractive keratectomy; LASIK laser assisted in situ keratomileusis; HTN hypertension; DM diabetes mellitus; COPD chronic obstructive pulmonary disease

## 2020-08-15 NOTE — Patient Instructions (Signed)
Patient reports she is ready to go back to work at her around March 9

## 2020-08-15 NOTE — Assessment & Plan Note (Signed)
OD now 3 weeks status post successful repair of rhegmatogenous retinal detachment, with a temporal scleral buckle.  Retina attached nicely  Patient continues to have topical surface irritation from dissolvable stitches, these findings were reviewed with the patient and they tend to last 6 to 8 weeks.  Of asked the patient not to consider use of contact lens in this right eye until those stitches have diminished and we will follow the patient now in 4 weeks to monitor for this

## 2020-08-18 ENCOUNTER — Encounter (INDEPENDENT_AMBULATORY_CARE_PROVIDER_SITE_OTHER): Payer: BC Managed Care – PPO | Admitting: Ophthalmology

## 2020-08-22 ENCOUNTER — Encounter (INDEPENDENT_AMBULATORY_CARE_PROVIDER_SITE_OTHER): Payer: BC Managed Care – PPO | Admitting: Ophthalmology

## 2020-08-22 ENCOUNTER — Other Ambulatory Visit: Payer: Self-pay | Admitting: Podiatry

## 2020-08-23 ENCOUNTER — Encounter (INDEPENDENT_AMBULATORY_CARE_PROVIDER_SITE_OTHER): Payer: Self-pay | Admitting: Ophthalmology

## 2020-08-23 ENCOUNTER — Other Ambulatory Visit: Payer: Self-pay

## 2020-08-23 ENCOUNTER — Ambulatory Visit (INDEPENDENT_AMBULATORY_CARE_PROVIDER_SITE_OTHER): Payer: BC Managed Care – PPO | Admitting: Ophthalmology

## 2020-08-23 DIAGNOSIS — H33021 Retinal detachment with multiple breaks, right eye: Secondary | ICD-10-CM

## 2020-08-23 MED ORDER — LOTEPREDNOL ETABONATE 0.5 % OP SUSP: 1.0000 [drp] | Freq: Four times a day (QID) | OPHTHALMIC | 1 refills | Status: AC

## 2020-08-23 NOTE — Telephone Encounter (Signed)
Please advise 

## 2020-08-23 NOTE — Progress Notes (Signed)
08/23/2020     CHIEF COMPLAINT Patient presents for Post-op Follow-up (3.5 Week s/p scleral buckle cryopexy OD. OC and FP/Pt c/o pain in and around OD. Pt states this has been off and on since sx. Pt states OD is still swollen. Tylenol does seem to help pain relief. Pt still using pred forte gtts.)   HISTORY OF PRESENT ILLNESS: Erika [name] is a 55 y.o. female who presents to the clinic today for:   HPI    Post-op Follow-up    In right eye.  Discomfort includes pain.  Vision is stable.  I, the attending physician,  performed the HPI with the patient and updated documentation appropriately. Additional comments: 3.5 Week s/p scleral buckle cryopexy OD. OC and FP Pt c/o pain in and around OD. Pt states this has been off and on since sx. Pt states OD is still swollen. Tylenol does seem to help pain relief. Pt still using pred forte gtts.       Last edited by Tilda Franco on 08/23/2020  3:37 PM. (History)      Referring physician: Martinique, Betty G, MD [place],  [place] [id number]  HISTORICAL INFORMATION:   Selected notes from the MEDICAL RECORD NUMBER    Lab Results  Component Value Date   HGBA1C 10.2 10/17/2011     CURRENT MEDICATIONS: Current Outpatient Medications (Ophthalmic Drugs)  Medication Sig  . brimonidine (ALPHAGAN P) 0.1 % SOLN Place 1 drop into both eyes every morning.  . brimonidine (ALPHAGAN) 0.2 % ophthalmic solution Place 1 drop into both eyes 2 (two) times daily. (Patient taking differently: Place 1 drop into both eyes 3 (three) times daily.)  . latanoprost (XALATAN) 0.005 % ophthalmic solution Place 1 drop into both eyes at bedtime.  . prednisoLONE acetate (PRED FORTE) 1 % ophthalmic suspension Place 1 drop into the right eye 4 (four) times daily.   No current facility-administered medications for this visit. (Ophthalmic Drugs)   Current Outpatient Medications (Other)  Medication Sig  . albuterol (VENTOLIN HFA) 108 (90 Base) MCG/ACT  inhaler Inhale 2 puffs into the lungs every 6 (six) hours as needed for wheezing or shortness of breath.  Marland Kitchen atorvastatin (LIPITOR) 10 MG tablet Take 10 mg by mouth at bedtime.  . BD PEN NEEDLE NANO 2ND GEN 32G X 4 MM MISC USE TO ADMINSTER INSULIN ONCE A DAY SUBCUTANEOUS AS DIRECTED  . chlorhexidine (PERIDEX) 0.12 % solution SMARTSIG:0.5 By Mouth Twice Daily  . cholecalciferol (VITAMIN D) 1000 units tablet Take 1,000 Units by mouth daily.  Marland Kitchen FARXIGA 10 MG TABS tablet Take 10 mg by mouth daily.  . fexofenadine (ALLEGRA) 180 MG tablet Take 180 mg by mouth daily.  . flurbiprofen (ANSAID) 100 MG tablet Take 100 mg by mouth 2 (two) times daily.  . Insulin Glargine (BASAGLAR KWIKPEN) 100 UNIT/ML SOPN INJECT 100 UNITS (TITRATE AS DIRECTED, MAX DAILY DOSE OF 100 UNITS) ONCE A DAY  . levothyroxine (SYNTHROID, LEVOTHROID) 25 MCG tablet TAKE 1 TABLET BY MOUTH EVERY DAY  . metFORMIN (GLUCOPHAGE-XR) 500 MG 24 hr tablet Take 2 tablets (1,000 mg total) by mouth 2 (two) times daily.  . montelukast (SINGULAIR) 10 MG tablet Take 1 tablet (10 mg total) by mouth at bedtime.  . NONFORMULARY OR COMPOUNDED ITEM Antifungal solution: Terbinafine 3%, Fluconazole 2%, Tea Tree Oil 5%, Urea 10%, Ibuprofen 2% in DMSO suspension #65mL  . penicillin v potassium (VEETID) 500 MG tablet Take 500 mg by mouth 4 (four) times daily.  Marland Kitchen  polyethylene glycol powder (GLYCOLAX/MIRALAX) 17 GM/SCOOP powder Take 17 grams po prn  . pseudoephedrine (SUDAFED) 30 MG tablet Take by mouth.  . Spacer/Aero-Holding Chambers (AEROCHAMBER PLUS) inhaler Use as instructed to use with inahaler.  . SYMBICORT 80-4.5 MCG/ACT inhaler TAKE 2 PUFFS BY MOUTH TWICE A DAY  . traMADol (ULTRAM) 50 MG tablet Take 50 mg by mouth every 6 (six) hours as needed.  . triamterene-hydrochlorothiazide (MAXZIDE-25) 37.5-25 MG tablet Take 1 tablet by mouth every morning.  . TRULICITY 7.35 HG/9.9ME SOPN SMARTSIG:0.5 Milliliter(s) SUB-Q Once a Week  . VASCEPA 1 g capsule Take 1 g  by mouth 2 (two) times daily.  Marland Kitchen XIGDUO XR 10-998 MG TB24 Take 1 tablet by mouth 2 (two) times daily.   No current facility-administered medications for this visit. (Other)      REVIEW OF SYSTEMS:    ALLERGIES Allergies  Allergen Reactions  . Codeine Other (See Comments)    dizzy  . Latex Hives  . Other Other (See Comments)    Allergy to msg   Reaction- headache Allergy to saccharin-  Reaction- headache  . Benadryl [Diphenhydramine] Rash  . Nitrofurantoin Monohyd Macro Rash  . Sulfa Antibiotics Rash    PAST MEDICAL HISTORY Past Medical History:  Diagnosis Date  . Arthritis    hands, neck  . Asthma    no inhaler, no problems x 5 years.  . 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 Cervical dysplasia   . Depression    no med  . Diabetes mellitus without complication (HCC)    Type 2  . 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 Fibroid   . Goiter   . Heart murmur    never had any problems  . Hyperlipidemia   . Hypertension   . Hypothyroid   . Infertility, female   . 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 Neuropathy    located fingers and toes  . Ocular hypertension   . PCOS (polycystic ovarian syndrome)   . Seasonal allergies   . Umbilical hernia   . Urinary incontinence    Past Surgical History:  Procedure Laterality Date  . BACK SURGERY  12/1997,01/12/1998,01/20/1998   x 3 surgeries - L4/L5/L6  . BREAST BIOPSY Right 12/14/2004  . COLPOSCOPY    . DILATATION & CURETTAGE/HYSTEROSCOPY WITH MYOSURE N/A 12/14/2014   Procedure: DILATATION & CURETTAGE/HYSTEROSCOPY WITH MYOSURE;  Surgeon: [name], MD;  Location: [facility];  Service: Gynecology;  Laterality: N/A;  to follow 2nd case 10:00?  needs one hour OR time  . DILATION AND CURETTAGE OF UTERUS  2010   endometrial polyp  . HYSTEROSCOPY  2004  . MYOMECTOMY  2004  . OVARY SURGERY  09/1998   bilateral ovarian transposition due to  radiation treatment for spine lesion  . WISDOM TOOTH EXTRACTION      FAMILY HISTORY Family History  Problem Relation Age of Onset  . Lung cancer Mother   . Hypertension Maternal Aunt   . Breast cancer Maternal Aunt        Age 39  . Thyroid disease Maternal Aunt   . Colon cancer Maternal Aunt   . Lung cancer Maternal Uncle   . Esophageal cancer Maternal Uncle   . Cirrhosis Maternal Aunt   . Colon cancer Maternal Aunt   . Throat cancer Brother     SOCIAL HISTORY Social History   Tobacco Use  .  Smoking status: Former Smoker    Packs/day: 0.25    Years: 9.00    Pack years: 2.25    Types: Cigarettes    Quit date: 08/22/1985    Years since quitting: 35.0  . Smokeless tobacco: Never Used  Vaping Use  . Vaping Use: Never used  Substance Use Topics  . Alcohol use: Yes    Alcohol/week: 0.0 standard drinks    Comment: rare wine  . Drug use: No         OPHTHALMIC EXAM: Base Eye Exam    Visual Acuity (Snellen - Linear)      Right Left   Dist cc 20/20 -2 20/25 -1   Correction: Glasses       Tonometry (Tonopen, 3:36 PM)      Right Left   Pressure 24 21       Pupils      Pupils Dark Light Shape React APD   Right PERRL 4 3 Round Brisk None   Left PERRL 4 3 Round Brisk None       Neuro/Psych    Oriented x3: Yes   Mood/Affect: Normal       Dilation    Right eye: 1.0% Mydriacyl, 2.5% Phenylephrine @ 3:36 PM        Slit Lamp and Fundus Exam    External Exam      Right Left   External Periorbital edema, Ecchymosis        Slit Lamp Exam      Right Left   Lids/Lashes Ecchymosis    Conjunctiva/Sclera 2+ Injection, Chemosis, with Vicryl sutures noted temporally    Cornea Clear    Anterior Chamber Deep and quiet    Iris Round and reactive    Lens 1+ Nuclear sclerosis    Anterior Vitreous Normal        Fundus Exam      Right Left   Posterior Vitreous Normal,     Disc Normal, nerve pink, vessels perfused    C/D Ratio 0.3    Macula Macula attached     Vessels Normal    Periphery Scleral buckle temporal, good retinopexy, cryo reaction superotemporal quadrant on buckle, attached, no subretinal fluid           IMAGING AND PROCEDURES  Imaging and Procedures for 08/23/20  OCT, Retina - OU - Both Eyes       Right Eye Quality was good. Scan locations included subfoveal. Central Foveal Thickness: 244. Progression has been stable. Findings include abnormal foveal contour.   Left Eye Quality was good. Scan locations included subfoveal. Central Foveal Thickness: 232. Progression has been stable.   Notes Residual subretinal fluid temporal to the fovea from the prior retinal detachment threatening the macula OD, this is likely to slowly resorb over time       Color Fundus Photography Optos - OU - Both Eyes       Right Eye Progression has improved. Disc findings include normal observations. Macula : normal observations. Vessels : normal observations. Periphery : normal observations.   Left Eye Progression has been stable. Disc findings include normal observations. Macula : normal observations. Vessels : normal observations. Periphery : normal observations.                 ASSESSMENT/PLAN:  No problem-specific Assessment & Plan notes found for this encounter.      ICD-10-CM   1. 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  2.  3.  Ophthalmic Meds Ordered this visit:  No orders of the defined types were placed in this encounter.      No follow-ups on file.  There are no Patient Instructions on file for this visit.   Explained the diagnoses, plan, and follow up with the patient and they expressed understanding.  Patient expressed understanding of the importance of proper follow up care.   Clent Demark Orella Cushman M.D. Diseases & Surgery of the Retina and Vitreous Retina [facility] Diabetic [place] 08/23/20     Abbreviations: M myopia (nearsighted); A astigmatism; H hyperopia (farsighted); P presbyopia; Mrx spectacle prescription;  CTL contact lenses; OD right eye; OS left eye; OU both eyes  XT exotropia; ET esotropia; PEK punctate epithelial keratitis; PEE punctate epithelial erosions; DES dry eye syndrome; MGD meibomian gland dysfunction; ATs artificial tears; PFAT's preservative free artificial tears; [place] nuclear sclerotic cataract; PSC posterior subcapsular cataract; ERM epi-retinal membrane; PVD posterior vitreous detachment; RD retinal detachment; DM diabetes mellitus; DR diabetic retinopathy; NPDR non-proliferative diabetic retinopathy; PDR proliferative diabetic retinopathy; CSME clinically significant macular edema; DME diabetic macular edema; dbh dot blot hemorrhages; CWS cotton wool spot; POAG primary open angle glaucoma; C/D cup-to-disc ratio; HVF humphrey visual field; GVF goldmann visual field; OCT optical coherence tomography; IOP intraocular pressure; BRVO Branch retinal vein occlusion; CRVO central retinal vein occlusion; CRAO central retinal artery occlusion; BRAO branch retinal artery occlusion; RT retinal tear; SB scleral buckle; PPV pars plana vitrectomy; VH Vitreous hemorrhage; PRP panretinal laser photocoagulation; IVK intravitreal kenalog; VMT vitreomacular traction; MH Macular hole;  NVD neovascularization of the disc; NVE neovascularization elsewhere; AREDS age related eye disease study; ARMD age related macular degeneration; POAG primary open angle glaucoma; EBMD epithelial/anterior basement membrane dystrophy; ACIOL anterior chamber intraocular lens; IOL intraocular lens; PCIOL posterior chamber intraocular lens; Phaco/IOL phacoemulsification with intraocular lens placement; Green photorefractive keratectomy; LASIK laser assisted in situ keratomileusis; HTN hypertension; DM diabetes mellitus; COPD chronic obstructive pulmonary disease

## 2020-08-26 ENCOUNTER — Other Ambulatory Visit: Payer: Self-pay

## 2020-08-26 ENCOUNTER — Encounter: Payer: Self-pay | Admitting: Podiatry

## 2020-08-26 ENCOUNTER — Ambulatory Visit (INDEPENDENT_AMBULATORY_CARE_PROVIDER_SITE_OTHER): Payer: BC Managed Care – PPO | Admitting: Podiatry

## 2020-08-26 DIAGNOSIS — M79675 Pain in left toe(s): Secondary | ICD-10-CM | POA: Diagnosis not present

## 2020-08-26 DIAGNOSIS — S90222A Contusion of left lesser toe(s) with damage to nail, initial encounter: Secondary | ICD-10-CM | POA: Diagnosis not present

## 2020-08-26 DIAGNOSIS — E1140 Type 2 diabetes mellitus with diabetic neuropathy, unspecified: Secondary | ICD-10-CM

## 2020-08-26 DIAGNOSIS — E1149 Type 2 diabetes mellitus with other diabetic neurological complication: Secondary | ICD-10-CM

## 2020-08-26 DIAGNOSIS — M79674 Pain in right toe(s): Secondary | ICD-10-CM

## 2020-08-26 DIAGNOSIS — B351 Tinea unguium: Secondary | ICD-10-CM

## 2020-08-26 NOTE — Patient Instructions (Signed)
EPSOM SALT FOOT SOAK INSTRUCTIONS  *IF YOU HAVE BEEN PRESCRIBED ANTIBIOTICS, TAKE AS INSTRUCTED UNTIL ALL ARE GONE*  Shopping List:  A. Plain epsom salt (not scented) B. Neosporin Cream/Ointment or Bacitracin Cream/Ointment  C. 1-inch fabric band-aids  1.  Place 1/4 cup of epsom salts in 2 quarts of warm tap water. IF YOU ARE DIABETIC, OR HAVE NEUROPATHY, CHECK THE TEMPERATURE OF THE WATER WITH YOUR ELBOW.  2.  Submerge your foot/feet in the solution and soak for 10-15 minutes.      3.  Next, remove your foot/feet from solution, blot dry the affected area.    4.  Apply light amount of antibiotic cream/ointment and cover with fabric band-aid .  5.  This soak should be done once a day for 7 days.   6.  Monitor for any signs/symptoms of infection such as redness, swelling, odor, drainage, increased pain, or non-healing of digit.   7.  Please do not hesitate to call the office and speak to a Nurse or Doctor if you have questions.   8.  If you experience fever, chills, nightsweats, nausea or vomiting with worsening of digit, please go to the emergency room.

## 2020-09-01 ENCOUNTER — Telehealth: Payer: Self-pay | Admitting: Podiatry

## 2020-09-01 NOTE — Telephone Encounter (Signed)
Patient calling to inform Dr.Galaway that her toe is still red, and is becoming concerned. Please advise if she needs to be seen. Patient stated she will also accept a phone call/televisit if there is no availability for an office visit.

## 2020-09-01 NOTE — Telephone Encounter (Signed)
Can you have her come in tomorrow?

## 2020-09-02 ENCOUNTER — Telehealth: Payer: Self-pay | Admitting: Podiatry

## 2020-09-02 ENCOUNTER — Ambulatory Visit (INDEPENDENT_AMBULATORY_CARE_PROVIDER_SITE_OTHER): Payer: BC Managed Care – PPO | Admitting: Podiatry

## 2020-09-02 ENCOUNTER — Encounter: Payer: Self-pay | Admitting: Podiatry

## 2020-09-02 ENCOUNTER — Other Ambulatory Visit: Payer: Self-pay

## 2020-09-02 DIAGNOSIS — S90229A Contusion of unspecified lesser toe(s) with damage to nail, initial encounter: Secondary | ICD-10-CM

## 2020-09-02 MED ORDER — DOXYCYCLINE HYCLATE 100 MG PO CAPS: 100.0000 mg | ORAL_CAPSULE | Freq: Two times a day (BID) | ORAL | 0 refills | Status: AC

## 2020-09-02 NOTE — Telephone Encounter (Signed)
Called pt lvm to schedule her an appt to come and see Dr. Elisha Ponder 09/02/20

## 2020-09-02 NOTE — Progress Notes (Addendum)
Subjective:  Patient ID: EMLYN MAVES, female    DOB: Oct 27, 1965,  MRN: [id number]  55 y.o. female presents with preventative diabetic foot care and painful thick toenails that are difficult to trim. Pain interferes with ambulation. Aggravating factors include wearing enclosed shoe gear. Pain is relieved with periodic professional debridement.   Patient states blood glucose was 160 mg/dl and she just checks it on weekends. Last A1c was 10.2%.  She would like to know if she should continue terbinafine. She thinks right hallux is still red.  Review of Systems: Negative except as noted in the HPI.   Allergies  Allergen Reactions  . Codeine Other (See Comments)    dizzy  . Latex Hives  . Other Other (See Comments)    Allergy to msg   Reaction- headache Allergy to saccharin-  Reaction- headache  . Benadryl [Diphenhydramine] Rash  . Nitrofurantoin Monohyd Macro Rash  . Sulfa Antibiotics Rash   Objective:  There were no vitals filed for this visit. Constitutional Patient is a pleasant 55 y.o. Caucasian female in NAD.Marland Kitchen AAO x 3.  Vascular Capillary fill time to digits <3 seconds b/l lower extremities. Palpable pedal pulses b/l LE. Pedal hair sparse. Lower extremity skin temperature gradient within normal limits. No pain with calf compression b/l. No cyanosis or clubbing noted.  Neurologic Normal speech. Oriented to person, place, and time.Pt ha s subjective symptoms of neuropathy. Protective sensation diminished with 10g monofilament b/l.  Dermatologic Pedal skin with normal turgor, texture and tone bilaterally. No open wounds bilaterally. No interdigital macerations bilaterally. Toenails 1-5 b/l elongated, discolored, dystrophic, thickened, crumbly with subungual debris and tenderness to dorsal palpation. She does have blanchable erythema to right hallux and I think this is from pressure from shoe gear. Subungual hematoma noted of the left 2nd toe with onycholysis of entire nailplate with  minimal attachment to proximal nailfold. There is nail border hypertrophy of proximal nailfold with blanchable erythema. No edema, no active drainage, no flocculence, no fluid expressed when pressure applied to digit.  Orthopedic: Normal muscle strength 5/5 to all lower extremity muscle groups bilaterally. Patient ambulates independent of any assistive aids.   Assessment:   1. Pain due to onychomycosis of toenails of both feet   2. Subungual hematoma of toe of left foot, initial encounter   3. Diabetic neuropathy with neurologic complication [facility])    Plan:  Patient was evaluated and treated and all questions answered.  Onychomycosis with pain -Nails palliatively debridement as below. -Educated on self-care  Procedure: Nail Debridement Rationale: Pain Type of Debridement: manual, sharp debridement. Instrumentation: Nail nipper, rotary burr. Number of Nails: 10  -Examined patient. -Continue diabetic foot care principles. -Toenails 1-5 b/l were debrided in length and girth with sterile nail nippers and dremel without iatrogenic bleeding.  -Patient to report any pedal injuries to medical professional immediately. -Evacuated subungual hematoma and removed remaining attachment of nailplate left 2nd digit.  Cleansed left 2nd digit. Dispensed instructions for epsom salt soaks. She is to perform once daily for one week. Call office if she has any problems. -Regarding topical antifungal solution from [facility], demonstrated visually by applying medication to her toenails. If she gets some of the solution on her skin, she is to wipe off with a soft cloth. If one of her toenails come off, she is to discontinue applying the medication on that digit. She related understanding. -Patient to continue soft, supportive shoe gear daily. -Patient/POA to call should there be question/concern in the interim.  Return  in about 3 months (around 11/26/2020).  Marzetta Board, DPM

## 2020-09-02 NOTE — Telephone Encounter (Signed)
Good morning. Just now seeing this. Ill call her now

## 2020-09-04 NOTE — Progress Notes (Signed)
  Subjective:  Patient ID: Erika [name], female    DOB: May 01, 1966,  MRN: [id number]  55 y.o. female presents with follow up s/p evacuation of subungual hematoma left 2nd digit with removal of loose nailplate. She states digit is still red. She did not start epsom salt soak until Sunday. She feels it has not improved. Denies any drainage or pus.   Review of Systems: Negative except as noted in the HPI.   Allergies  Allergen Reactions  . Codeine Other (See Comments)    dizzy Other reaction(s): too strong/makes dizzy  . Latex Hives  . Other Other (See Comments)    Allergy to msg   Reaction- headache Allergy to saccharin-  Reaction- headache Other reaction(s): rash  . Benadryl [Diphenhydramine] Rash  . Nitrofurantoin Monohyd Macro Rash  . Sulfa Antibiotics Rash   Objective:  There were no vitals filed for this visit. Constitutional Patient is a pleasant 55 y.o. Caucasian female in NAD.Marland Kitchen AAO x 3.  Vascular Capillary fill time to digits <3 seconds b/l lower extremities. Palpable pedal pulses b/l LE. Pedal hair sparse. Lower extremity skin temperature gradient within normal limits. No pain with calf compression b/l. No cyanosis or clubbing noted.  Neurologic Normal speech. Oriented to person, place, and time.Pt ha s subjective symptoms of neuropathy. Protective sensation diminished with 10g monofilament b/l.  Dermatologic Pedal skin with normal turgor, texture and tone bilaterally. No open wounds bilaterally. No interdigital macerations bilaterally. Toenails 1-5 b/l recently debrided.   Left 2nd digit with evidence of old heme on nailbed. There is nail border hypertrophy of proximal nailfold with blanchable erythema. No edema, no active drainage, no fluctuance, no fluid expressed when pressure applied to digit, no pus.  Orthopedic: Normal muscle strength 5/5 to all lower extremity muscle groups bilaterally. Patient ambulates independent of any assistive aids.   Assessment:   1. Subungual  hematoma of second toe    Plan:  Patient was evaluated and treated and all questions answered. -Examined patient. -Continue diabetic foot care principles. -She does have blanchable erythema, but I do not believe to be cellulitis. Will send Rx for doxycycline 100 mg po bid x 7 days. -Advised her she does not have to use topical antifungal nail medication if she is apprehensive of doing so She related understanding. -Patient to report any pedal injuries to medical professional immediately. -Patient to continue soft, supportive shoe gear daily. -Patient/POA to call should there be question/concern in the interim.  Return in about 3 months (around 12/03/2020).  Marzetta Board, DPM

## 2020-09-05 ENCOUNTER — Ambulatory Visit (INDEPENDENT_AMBULATORY_CARE_PROVIDER_SITE_OTHER): Payer: BC Managed Care – PPO | Admitting: Ophthalmology

## 2020-09-05 ENCOUNTER — Other Ambulatory Visit: Payer: Self-pay

## 2020-09-05 ENCOUNTER — Encounter (INDEPENDENT_AMBULATORY_CARE_PROVIDER_SITE_OTHER): Payer: Self-pay | Admitting: Ophthalmology

## 2020-09-05 ENCOUNTER — Encounter (INDEPENDENT_AMBULATORY_CARE_PROVIDER_SITE_OTHER): Payer: BC Managed Care – PPO | Admitting: Ophthalmology

## 2020-09-05 DIAGNOSIS — Z09 Encounter for follow-up examination after completed treatment for conditions other than malignant neoplasm: Secondary | ICD-10-CM

## 2020-09-05 DIAGNOSIS — H33021 Retinal detachment with multiple breaks, right eye: Secondary | ICD-10-CM | POA: Diagnosis not present

## 2020-09-05 NOTE — Progress Notes (Signed)
09/05/2020     CHIEF COMPLAINT Patient presents for Post-op Follow-up (3 Week Post Op OD. OCT and FP/Pt states OD is still very red. Pt sees a hair like floater in vision. Pt states she has an occasional film over OD vision. Denies ocular pain. Pt using gtts as directed.)   HISTORY OF PRESENT ILLNESS: Erika [name] is a 55 y.o. female who presents to the clinic today for:   HPI    Post-op Follow-up    In right eye.  Discomfort includes none.  Vision is stable.  I, the attending physician,  performed the HPI with the patient and updated documentation appropriately. Additional comments: 3 Week Post Op OD. OCT and FP Pt states OD is still very red. Pt sees a hair like floater in vision. Pt states she has an occasional film over OD vision. Denies ocular pain. Pt using gtts as directed.       Last edited by Tilda Franco on 09/05/2020  3:06 PM. (History)      Referring physician: [place], Betty G, MD [place],  [place] [id number]  HISTORICAL INFORMATION:   Selected notes from the MEDICAL RECORD NUMBER    Lab Results  Component Value Date   HGBA1C 10.2 10/17/2011     CURRENT MEDICATIONS: Current Outpatient Medications (Ophthalmic Drugs)  Medication Sig  . brimonidine (ALPHAGAN P) 0.1 % SOLN Place 1 drop into both eyes every morning.  . brimonidine (ALPHAGAN) 0.2 % ophthalmic solution Place 1 drop into both eyes 2 (two) times daily. (Patient taking differently: Place 1 drop into both eyes 3 (three) times daily.)  . latanoprost (XALATAN) 0.005 % ophthalmic solution Place 1 drop into both eyes at bedtime.  Marland Kitchen loteprednol (LOTEMAX) 0.5 % ophthalmic suspension Place 1 drop into the right eye 4 (four) times daily.  . prednisoLONE acetate (PRED FORTE) 1 % ophthalmic suspension Place 1 drop into the right eye 4 (four) times daily.   No current facility-administered medications for this visit. (Ophthalmic Drugs)   Current Outpatient Medications (Other)  Medication  Sig  . acetaminophen (TYLENOL) 325 MG tablet 1 tablet as needed  . albuterol (VENTOLIN HFA) 108 (90 Base) MCG/ACT inhaler Inhale 2 puffs into the lungs every 6 (six) hours as needed for wheezing or shortness of breath.  Marland Kitchen atorvastatin (LIPITOR) 10 MG tablet Take 10 mg by mouth at bedtime.  . BD PEN NEEDLE NANO 2ND GEN 32G X 4 MM MISC USE TO ADMINSTER INSULIN ONCE A DAY SUBCUTANEOUS AS DIRECTED  . benzonatate (TESSALON) 100 MG capsule Take 100 mg by mouth 3 (three) times daily.  . chlorhexidine (PERIDEX) 0.12 % solution SMARTSIG:0.5 By Mouth Twice Daily  . cholecalciferol (VITAMIN D) 1000 units tablet Take 1,000 Units by mouth daily.  . cholecalciferol (VITAMIN D) 25 MCG (1000 UNIT) tablet 1 tablet  . ciprofloxacin (CIPRO) 250 MG tablet Take 250 mg by mouth 2 (two) times daily.  . Dapagliflozin-metFORMIN HCl ER (XIGDUO XR) 10-998 MG TB24 2 tablets  . doxycycline (VIBRAMYCIN) 100 MG capsule Take 1 capsule (100 mg total) by mouth 2 (two) times daily for 7 days.  Marland Kitchen FARXIGA 10 MG TABS tablet Take 10 mg by mouth daily.  . fexofenadine (ALLEGRA) 180 MG tablet Take 180 mg by mouth daily.  . fluconazole (DIFLUCAN) 150 MG tablet 1 tablet  . flurbiprofen (ANSAID) 100 MG tablet Take 100 mg by mouth 2 (two) times daily.  . fluticasone (FLONASE) 50 MCG/ACT nasal spray Place into the nose.  Marland Kitchen  glucose blood test strip For use when checking blood glucose  . ibuprofen (ADVIL) 200 MG tablet 1 tablet with food or milk as needed  . Icosapent Ethyl (VASCEPA PO) 2 capsules  . Insulin Glargine (BASAGLAR KWIKPEN) 100 UNIT/ML SOPN INJECT 100 UNITS (TITRATE AS DIRECTED, MAX DAILY DOSE OF 100 UNITS) ONCE A DAY  . ipratropium (ATROVENT) 0.03 % nasal spray USE 2 SPRAYS INTO EACH NOSTRIL EVERY 12 (TWELVE) HOURS.  Marland Kitchen levothyroxine (SYNTHROID, LEVOTHROID) 25 MCG tablet TAKE 1 TABLET BY MOUTH EVERY DAY  . metFORMIN (GLUCOPHAGE-XR) 500 MG 24 hr tablet Take 2 tablets (1,000 mg total) by mouth 2 (two) times daily.  . montelukast  (SINGULAIR) 10 MG tablet Take 1 tablet (10 mg total) by mouth at bedtime.  . NONFORMULARY OR COMPOUNDED ITEM Antifungal solution: Terbinafine 3%, Fluconazole 2%, Tea Tree Oil 5%, Urea 10%, Ibuprofen 2% in DMSO suspension #41mL  . OneTouch Delica Lancets 97D MISC For use when checking blood glucose  . polyethylene glycol powder (GLYCOLAX/MIRALAX) 17 GM/SCOOP powder Take 17 grams po prn  . pseudoephedrine (SUDAFED) 30 MG tablet Take by mouth.  . Spacer/Aero-Holding Chambers (AEROCHAMBER PLUS) inhaler Use as instructed to use with inahaler.  Marland Kitchen Spacer/Aero-Holding Chambers (AEROCHAMBER PLUS) inhaler use with inhalor  . SYMBICORT 80-4.5 MCG/ACT inhaler TAKE 2 PUFFS BY MOUTH TWICE A DAY  . terbinafine (LAMISIL) 250 MG tablet TAKE 1 TABLET DAILY FOR 7 DAYS, REPEAT EVERY 4 WEEKS X 4 MONTHS  . traMADol (ULTRAM) 50 MG tablet Take 50 mg by mouth every 6 (six) hours as needed.  . triamterene-hydrochlorothiazide (MAXZIDE-25) 37.5-25 MG tablet Take 1 tablet by mouth every morning.  . TRULICITY 5.32 DJ/2.4QA SOPN SMARTSIG:0.5 Milliliter(s) SUB-Q Once a Week  . VASCEPA 1 g capsule Take 1 g by mouth 2 (two) times daily.  Marland Kitchen XIGDUO XR 10-998 MG TB24 Take 1 tablet by mouth 2 (two) times daily.   No current facility-administered medications for this visit. (Other)      REVIEW OF SYSTEMS:    ALLERGIES Allergies  Allergen Reactions  . Codeine Other (See Comments)    dizzy Other reaction(s): too strong/makes dizzy  . Latex Hives  . Other Other (See Comments)    Allergy to msg   Reaction- headache Allergy to saccharin-  Reaction- headache Other reaction(s): rash  . Benadryl [Diphenhydramine] Rash  . Nitrofurantoin Monohyd Macro Rash  . Sulfa Antibiotics Rash    PAST MEDICAL HISTORY Past Medical History:  Diagnosis Date  . Arthritis    hands, neck  . Asthma    no inhaler, no problems x 5 years.  . 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 Cervical dysplasia   . Depression     no med  . Diabetes mellitus without complication (HCC)    Type 2  . 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 Fibroid   . Goiter   . Heart murmur    never had any problems  . Hyperlipidemia   . Hypertension   . Hypothyroid   . Infertility, female   . 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 Neuropathy    located fingers and toes  . Ocular hypertension   . PCOS (polycystic ovarian syndrome)   . Seasonal allergies   . Umbilical hernia   . Urinary incontinence    Past Surgical History:  Procedure Laterality Date  . BACK SURGERY  12/1997,01/12/1998,01/20/1998   x 3 surgeries -  L4/L5/L6  . BREAST BIOPSY Right 12/14/2004  . COLPOSCOPY    . DILATATION & CURETTAGE/HYSTEROSCOPY WITH MYOSURE N/A 12/14/2014   Procedure: DILATATION & CURETTAGE/HYSTEROSCOPY WITH MYOSURE;  Surgeon: [name], MD;  Location: [facility];  Service: Gynecology;  Laterality: N/A;  to follow 2nd case 10:00?  needs one hour OR time  . DILATION AND CURETTAGE OF UTERUS  2010   endometrial polyp  . HYSTEROSCOPY  2004  . MYOMECTOMY  2004  . OVARY SURGERY  09/1998   bilateral ovarian transposition due to radiation treatment for spine lesion  . WISDOM TOOTH EXTRACTION      FAMILY HISTORY Family History  Problem Relation Age of Onset  . Lung cancer Mother   . Hypertension Maternal Aunt   . Breast cancer Maternal Aunt        Age 53  . Thyroid disease Maternal Aunt   . Colon cancer Maternal Aunt   . Lung cancer Maternal Uncle   . Esophageal cancer Maternal Uncle   . Cirrhosis Maternal Aunt   . Colon cancer Maternal Aunt   . Throat cancer Brother     SOCIAL HISTORY Social History   Tobacco Use  . Smoking status: Former Smoker    Packs/day: 0.25    Years: 9.00    Pack years: 2.25    Types: Cigarettes    Quit date: 08/22/1985    Years since quitting: 35.0  . Smokeless tobacco: Never Used  Vaping Use  . Vaping Use: Never used  Substance Use  Topics  . Alcohol use: Yes    Alcohol/week: 0.0 standard drinks    Comment: rare wine  . Drug use: No         OPHTHALMIC EXAM: Base Eye Exam    Visual Acuity (Snellen - Linear)      Right Left   Dist cc 20/25 -1 20/25 -2   Correction: Glasses       Tonometry (Tonopen, 3:11 PM)      Right Left   Pressure 18 19       Pupils      Pupils Dark Light Shape React APD   Right PERRL 4 3 Round Brisk None   Left PERRL 4 3 Round Brisk None       Neuro/Psych    Oriented x3: Yes   Mood/Affect: Normal       Dilation    Right eye: 1.0% Mydriacyl, 2.5% Phenylephrine @ 3:11 PM        Slit Lamp and Fundus Exam    External Exam      Right Left   External Periorbital edema, Ecchymosis        Slit Lamp Exam      Right Left   Lids/Lashes Ecchymosis    Conjunctiva/Sclera 1+ Injection,with Vicryl sutures inferior, buried, no exposed tags    Cornea Clear    Anterior Chamber Deep and quiet    Iris Round and reactive    Lens 1+ Nuclear sclerosis    Anterior Vitreous Normal        Fundus Exam      Right Left   Posterior Vitreous Normal,     Disc Normal, nerve pink, vessels perfused    C/D Ratio 0.3    Macula Macula attached    Vessels Normal    Periphery Scleral buckle temporal, good retinopexy, cryo reaction superotemporal quadrant on buckle, attached, no subretinal fluid           IMAGING AND PROCEDURES  Imaging and  Procedures for 09/05/20  OCT, Retina - OU - Both Eyes       Right Eye Quality was good. Scan locations included subfoveal. Central Foveal Thickness: 250. Progression has been stable. Findings include abnormal foveal contour.   Left Eye Quality was good. Scan locations included subfoveal. Central Foveal Thickness: 234. Progression has been stable.   Notes Residual subretinal fluid temporal to the fovea from the prior retinal detachment threatening the macula OD, this is likely to slowly resorb over time       Color Fundus Photography Optos - OU -  Both Eyes       Right Eye Progression has improved. Disc findings include normal observations. Macula : normal observations. Vessels : normal observations. Periphery : normal observations.   Left Eye Progression has been stable. Disc findings include normal observations. Macula : normal observations. Vessels : normal observations. Periphery : normal observations.   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  We will continue to use topical Lotemax OD, to calm the ocular surface as buried Vicryl sutures continue to reabsorb  Postoperative follow-up Patient instructed to not use contact lenses on the surface of this eye until measurements have been retaken and in approximately 2 to 3 weeks once current bottle of Lotemax is completed      ICD-10-CM   1. 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 Postoperative follow-up  Z09     1.  Patient also instructed to follow-up with Dr. Virgina Evener for refractive error measures, as likely to change post buckle right eye.  Also instructed the patient not to use resume contact lens wear until the current bottle Lotemax is completed.    2.  Otherwise patient may return to full activity  3.  Ophthalmic Meds Ordered this visit:  No orders of the defined types were placed in this encounter.      Return in about 3 months (around 12/06/2020) for OD, dilate, OCT.  There are no Patient Instructions on file for this visit.   Explained the diagnoses, plan, and follow up with the patient and they expressed understanding.  Patient expressed understanding of the importance of proper follow up care.   Clent Demark Terrill Wauters M.D. Diseases & Surgery of the Retina and Vitreous [facility]  [place] 09/05/20     Abbreviations: M myopia (nearsighted); A astigmatism; H hyperopia (farsighted); P presbyopia; Mrx spectacle prescription;  CTL contact lenses; OD right eye; OS left eye; OU both eyes  XT exotropia; ET esotropia; PEK punctate epithelial keratitis; PEE punctate epithelial erosions; DES dry eye syndrome; MGD meibomian gland dysfunction; ATs artificial tears; PFAT's preservative free artificial tears; [place] nuclear sclerotic cataract; PSC posterior subcapsular cataract; ERM epi-retinal membrane; PVD posterior vitreous detachment; RD retinal detachment; DM diabetes mellitus; DR diabetic retinopathy; NPDR non-proliferative diabetic retinopathy; PDR proliferative diabetic retinopathy; CSME clinically significant macular edema; DME diabetic macular edema; dbh dot blot hemorrhages; CWS cotton wool spot; POAG primary open angle glaucoma; C/D cup-to-disc ratio; HVF humphrey visual field; GVF goldmann visual field; OCT optical coherence tomography; IOP intraocular pressure; BRVO Branch retinal vein occlusion; CRVO central retinal vein occlusion; CRAO central retinal artery occlusion; BRAO  branch retinal artery occlusion; RT retinal tear; SB scleral buckle; PPV pars plana vitrectomy; VH Vitreous hemorrhage; PRP panretinal laser photocoagulation; IVK intravitreal kenalog; VMT vitreomacular traction; MH Macular hole;  NVD neovascularization of the disc; NVE neovascularization elsewhere; AREDS age related eye disease study; ARMD age related macular degeneration; POAG primary open angle glaucoma; EBMD epithelial/anterior basement membrane dystrophy; ACIOL anterior chamber intraocular lens; IOL intraocular lens; PCIOL posterior chamber intraocular lens; Phaco/IOL phacoemulsification with intraocular lens placement; [place] photorefractive keratectomy; LASIK laser assisted in situ keratomileusis; HTN hypertension; DM diabetes mellitus; COPD chronic obstructive pulmonary disease

## 2020-09-05 NOTE — Assessment & Plan Note (Signed)
Looks great OD currently at 6-week follow-up.  We will continue to use topical Lotemax OD, to calm the ocular surface as buried Vicryl sutures continue to reabsorb

## 2020-09-05 NOTE — Assessment & Plan Note (Signed)
Patient instructed to not use contact lenses on the surface of this eye until measurements have been retaken and in approximately 2 to 3 weeks once current bottle of Lotemax is completed

## 2020-09-26 ENCOUNTER — Other Ambulatory Visit: Payer: Self-pay | Admitting: Podiatry

## 2020-10-31 ENCOUNTER — Encounter (INDEPENDENT_AMBULATORY_CARE_PROVIDER_SITE_OTHER): Payer: BC Managed Care – PPO | Admitting: Ophthalmology

## 2020-11-14 ENCOUNTER — Ambulatory Visit (INDEPENDENT_AMBULATORY_CARE_PROVIDER_SITE_OTHER): Payer: BC Managed Care – PPO | Admitting: Ophthalmology

## 2020-11-14 ENCOUNTER — Other Ambulatory Visit: Payer: Self-pay

## 2020-11-14 ENCOUNTER — Encounter (INDEPENDENT_AMBULATORY_CARE_PROVIDER_SITE_OTHER): Payer: Self-pay | Admitting: Ophthalmology

## 2020-11-14 DIAGNOSIS — H2513 Age-related nuclear cataract, bilateral: Secondary | ICD-10-CM

## 2020-11-14 DIAGNOSIS — H43392 Other vitreous opacities, left eye: Secondary | ICD-10-CM

## 2020-11-14 DIAGNOSIS — H33021 Retinal detachment with multiple breaks, right eye: Secondary | ICD-10-CM

## 2020-11-14 NOTE — Progress Notes (Signed)
11/14/2020     CHIEF COMPLAINT Patient presents for Retina Follow Up (10 Wk F/U OD//Pt reports continued floaters OD, and "white spot" off and on in the corner. Pt sts she can tell when her BP is elevated, bc she sees black spots when she stands up. Pt c/o clear semi-circle OS off and on x 2 weeks. Pt is using brimonidine TID OU, and Latanoprost QHS OU.)   HISTORY OF PRESENT ILLNESS: Erika [name] is a 55 y.o. female who presents to the clinic today for:   HPI    Retina Follow Up    Diagnosis: Other   Laterality: right eye   Onset: 10 weeks ago   Severity: mild   Duration: 10 weeks   Course: stable   Comments: 10 Wk F/U OD  Pt reports continued floaters OD, and "white spot" off and on in the corner. Pt sts she can tell when her BP is elevated, bc she sees black spots when she stands up. Pt c/o clear semi-circle OS off and on x 2 weeks. Pt is using brimonidine TID OU, and Latanoprost QHS OU.       Last edited by Rockie Neighbours, [place] on 11/14/2020  3:33 PM. (History)     History of retinal detachment right eye repaired via scleral buckle cryopexy, February 2022.  Retina attached.  Vision is improved nicely.  Referring physician: Martinique, Betty G, MD [place],  [place] [address]  HISTORICAL INFORMATION:   Selected notes from the MEDICAL RECORD NUMBER    Lab Results  Component Value Date   HGBA1C 10.2 10/17/2011     CURRENT MEDICATIONS: Current Outpatient Medications (Ophthalmic Drugs)  Medication Sig  . latanoprost (XALATAN) 0.005 % ophthalmic solution Place 1 drop into both eyes at bedtime.  . brimonidine (ALPHAGAN P) 0.1 % SOLN Place 1 drop into both eyes every morning.  . loteprednol (LOTEMAX) 0.5 % ophthalmic suspension Place 1 drop into the right eye 4 (four) times daily.  . prednisoLONE acetate (PRED FORTE) 1 % ophthalmic suspension Place 1 drop into the right eye 4 (four) times daily. (Patient not taking: Reported on 11/14/2020)   No current  facility-administered medications for this visit. (Ophthalmic Drugs)   Current Outpatient Medications (Other)  Medication Sig  . acetaminophen (TYLENOL) 325 MG tablet 1 tablet as needed  . albuterol (VENTOLIN HFA) 108 (90 Base) MCG/ACT inhaler Inhale 2 puffs into the lungs every 6 (six) hours as needed for wheezing or shortness of breath.  Marland Kitchen atorvastatin (LIPITOR) 10 MG tablet Take 1 tablet by mouth at bedtime.  . BD PEN NEEDLE NANO 2ND GEN 32G X 4 MM MISC USE TO ADMINSTER INSULIN ONCE A DAY SUBCUTANEOUS AS DIRECTED  . benzonatate (TESSALON) 100 MG capsule Take 100 mg by mouth 3 (three) times daily.  . chlorhexidine (PERIDEX) 0.12 % solution SMARTSIG:0.5 By Mouth Twice Daily  . cholecalciferol (VITAMIN D) 1000 units tablet Take 1,000 Units by mouth daily.  . cholecalciferol (VITAMIN D) 25 MCG (1000 UNIT) tablet 1 tablet  . ciprofloxacin (CIPRO) 250 MG tablet Take 250 mg by mouth 2 (two) times daily.  . cyclobenzaprine (FLEXERIL) 10 MG tablet Take 1 tablet by mouth 3 (three) times daily as needed.  . Dapagliflozin-metFORMIN HCl ER (XIGDUO XR) 10-998 MG TB24 2 tablets  . FARXIGA 10 MG TABS tablet Take 10 mg by mouth daily.  . fexofenadine (ALLEGRA) 180 MG tablet Take 180 mg by mouth daily.  . fluconazole (DIFLUCAN) 150 MG tablet 1  tablet  . flurbiprofen (ANSAID) 100 MG tablet Take 100 mg by mouth 2 (two) times daily.  . fluticasone (FLONASE) 50 MCG/ACT nasal spray Place into the nose.  Marland Kitchen glucose blood test strip For use when checking blood glucose  . ibuprofen (ADVIL) 200 MG tablet 1 tablet with food or milk as needed  . Icosapent Ethyl (VASCEPA PO) 2 capsules  . Insulin Glargine (BASAGLAR KWIKPEN) 100 UNIT/ML SOPN INJECT 100 UNITS (TITRATE AS DIRECTED, MAX DAILY DOSE OF 100 UNITS) ONCE A DAY  . ipratropium (ATROVENT) 0.03 % nasal spray USE 2 SPRAYS INTO EACH NOSTRIL EVERY 12 (TWELVE) HOURS.  Marland Kitchen levothyroxine (SYNTHROID, LEVOTHROID) 25 MCG tablet TAKE 1 TABLET BY MOUTH EVERY DAY  . metFORMIN  (GLUCOPHAGE-XR) 500 MG 24 hr tablet Take 2 tablets (1,000 mg total) by mouth 2 (two) times daily.  . montelukast (SINGULAIR) 10 MG tablet Take 1 tablet (10 mg total) by mouth at bedtime.  . naproxen sodium (ANAPROX) 550 MG tablet Take 550 mg by mouth 2 (two) times daily as needed.  . NONFORMULARY OR COMPOUNDED ITEM Antifungal solution: Terbinafine 3%, Fluconazole 2%, Tea Tree Oil 5%, Urea 10%, Ibuprofen 2% in DMSO suspension #38mL  . OneTouch Delica Lancets 42V MISC For use when checking blood glucose  . polyethylene glycol powder (GLYCOLAX/MIRALAX) 17 GM/SCOOP powder Take 17 grams po prn  . pseudoephedrine (SUDAFED) 30 MG tablet Take by mouth.  . Spacer/Aero-Holding Chambers (AEROCHAMBER PLUS) inhaler Use as instructed to use with inahaler.  Marland Kitchen Spacer/Aero-Holding Chambers (AEROCHAMBER PLUS) inhaler use with inhalor  . SYMBICORT 80-4.5 MCG/ACT inhaler TAKE 2 PUFFS BY MOUTH TWICE A DAY  . terbinafine (LAMISIL) 250 MG tablet TAKE 1 TABLET DAILY FOR 7 DAYS, REPEAT EVERY 4 WEEKS X 4 MONTHS  . traMADol (ULTRAM) 50 MG tablet Take 50 mg by mouth every 6 (six) hours as needed.  . triamterene-hydrochlorothiazide (MAXZIDE-25) 37.5-25 MG tablet Take 1 tablet by mouth every morning.  . TRULICITY 9.56 LO/7.5IE SOPN SMARTSIG:0.5 Milliliter(s) SUB-Q Once a Week  . VASCEPA 1 g capsule Take 1 g by mouth 2 (two) times daily.  Marland Kitchen XIGDUO XR 10-998 MG TB24 Take 1 tablet by mouth 2 (two) times daily.   No current facility-administered medications for this visit. (Other)      REVIEW OF SYSTEMS:    ALLERGIES Allergies  Allergen Reactions  . Codeine Other (See Comments)    dizzy Other reaction(s): too strong/makes dizzy  . Latex Hives  . Other Other (See Comments)    Allergy to msg   Reaction- headache Allergy to saccharin-  Reaction- headache Other reaction(s): rash  . Benadryl [Diphenhydramine] Rash  . Nitrofurantoin Monohyd Macro Rash  . Sulfa Antibiotics Rash    PAST MEDICAL HISTORY Past Medical  History:  Diagnosis Date  . Arthritis    hands, neck  . Asthma    no inhaler, no problems x 5 years.  . 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 Cervical dysplasia   . Depression    no med  . Diabetes mellitus without complication (HCC)    Type 2  . 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 Fibroid   . Goiter   . Heart murmur    never had any problems  . Hyperlipidemia   . Hypertension   . Hypothyroid   . Infertility, female   . 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 Neuropathy    located  fingers and toes  . Ocular hypertension   . PCOS (polycystic ovarian syndrome)   . Seasonal allergies   . Umbilical hernia   . Urinary incontinence    Past Surgical History:  Procedure Laterality Date  . BACK SURGERY  12/1997,01/12/1998,01/20/1998   x 3 surgeries - L4/L5/L6  . BREAST BIOPSY Right 12/14/2004  . COLPOSCOPY    . DILATATION & CURETTAGE/HYSTEROSCOPY WITH MYOSURE N/A 12/14/2014   Procedure: DILATATION & CURETTAGE/HYSTEROSCOPY WITH MYOSURE;  Surgeon: [name], MD;  Location: [facility];  Service: Gynecology;  Laterality: N/A;  to follow 2nd case 10:00?  needs one hour OR time  . DILATION AND CURETTAGE OF UTERUS  2010   endometrial polyp  . HYSTEROSCOPY  2004  . MYOMECTOMY  2004  . OVARY SURGERY  09/1998   bilateral ovarian transposition due to radiation treatment for spine lesion  . WISDOM TOOTH EXTRACTION      FAMILY HISTORY Family History  Problem Relation Age of Onset  . Lung cancer Mother   . Hypertension Maternal Aunt   . Breast cancer Maternal Aunt        Age 78  . Thyroid disease Maternal Aunt   . Colon cancer Maternal Aunt   . Lung cancer Maternal Uncle   . Esophageal cancer Maternal Uncle   . Cirrhosis Maternal Aunt   . Colon cancer Maternal Aunt   . Throat cancer Brother     SOCIAL HISTORY Social History   Tobacco Use  . Smoking status: Former Smoker    Packs/day: 0.25     Years: 9.00    Pack years: 2.25    Types: Cigarettes    Quit date: 08/22/1985    Years since quitting: 35.2  . Smokeless tobacco: Never Used  Vaping Use  . Vaping Use: Never used  Substance Use Topics  . Alcohol use: Yes    Alcohol/week: 0.0 standard drinks    Comment: rare wine  . Drug use: No         OPHTHALMIC EXAM:  Base Eye Exam    Visual Acuity (ETDRS)      Right Left   Dist cc 20/25 +1 20/25 -2   Correction: Glasses       Tonometry (Tonopen, 3:33 PM)      Right Left   Pressure 19 23       Tonometry #2 (Tonopen, 3:33 PM)      Right Left   Pressure  20       Pupils      Pupils Dark Light Shape React APD   Right PERRL 6 5 Round Brisk None   Left PERRL 6 5 Round Brisk None       Visual Fields (Counting fingers)      Left Right    Full Full       Extraocular Movement      Right Left    Full Full       Neuro/Psych    Oriented x3: Yes   Mood/Affect: Normal       Dilation    Both eyes: 1.0% Mydriacyl, 2.5% Phenylephrine @ 3:33 PM        Slit Lamp and Fundus Exam    External Exam      Right Left   External Periorbital edema, Ecchymosis        Slit Lamp Exam      Right Left   Lids/Lashes Ecchymosis Ecchymosis   Conjunctiva/Sclera Trace Injection White and quiet   Cornea Clear Clear  Anterior Chamber Deep and quiet Deep and quiet   Iris Round and reactive Round and reactive   Lens 1+ Nuclear sclerosis 1+ Nuclear sclerosis   Anterior Vitreous Normal Normal       Fundus Exam      Right Left   Posterior Vitreous Normal,  Normal   Disc Normal, nerve pink, vessels perfused Normal   C/D Ratio 0.3 0.3   Macula Macula attached Normal   Vessels Normal Normal   Periphery Scleral buckle temporal, good retinopexy, cryo reaction superotemporal quadrant on buckle, attached, no subretinal fluid Normal          IMAGING AND PROCEDURES  Imaging and Procedures for 11/14/20  OCT, Retina - OU - Both Eyes       Right Eye Quality was good.  Scan locations included subfoveal. Central Foveal Thickness: 252. Progression has been stable. Findings include abnormal foveal contour.   Left Eye Quality was good. Scan locations included subfoveal. Central Foveal Thickness: 234. Progression has been stable. Findings include normal foveal contour.   Notes Loculated old subretinal fluid temporal to the fovea the right eye is persistent and is unchanged over the last 3 months.  This is likely the original subretinal fluid at did not drain at the time of retinal detachment repair and will likely take up to a year to fully resolve                ASSESSMENT/PLAN:  Vitreous floaters of left eye OS, no signs of hole or tear undilated funduscopic examination today  Age-related nuclear cataract of both eyes Follow-up with Dr. 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 Celine Ahr. Patel as scheduled      ICD-10-CM   1. 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 Vitreous floaters of left eye  H43.392   3. Age-related nuclear cataract of both eyes  H25.13     1.  OU look great.  Retina attached OD, no new findings OS  2.  Okay to resume contact lens wear as per Dr. Serita Grit instructions  3.  Ophthalmic Meds Ordered this visit:  No orders of the defined types were placed in this encounter.      Return in about 6 months (around 05/16/2021) for DILATE OU, COLOR FP, OCT.  There are no Patient Instructions on file for this visit.   Explained the diagnoses, plan, and follow up with the patient and they expressed understanding.  Patient expressed understanding of the importance of proper follow up care.   Clent Demark Monserrath Junio M.D. Diseases & Surgery of the Retina and Vitreous [facility]ina [facility] Di[facility] [place] 11/14/20     Abbreviations: M myopia (nearsighted); A astigmatism; H hyperopia (farsighted); P presbyopia; Mrx spectacle prescription;  CTL contact lenses; OD right eye; OS left eye; OU both eyes  XT exotropia; ET esotropia; PEK punctate epithelial keratitis; PEE punctate epithelial erosions; DES dry eye syndrome; MGD meibomian gland dysfunction; ATs artificial tears; PFAT's preservative free artificial tears; [place] nuclear sclerotic cataract; PSC posterior subcapsular cataract; ERM epi-retinal membrane; PVD posterior vitreous detachment; RD retinal detachment; DM diabetes mellitus; DR diabetic retinopathy; NPDR non-proliferative diabetic retinopathy; PDR proliferative diabetic retinopathy; CSME clinically significant macular edema; DME diabetic macular edema; dbh dot blot hemorrhages; CWS cotton wool spot; POAG primary open angle glaucoma; C/D  cup-to-disc ratio; HVF humphrey visual field; GVF goldmann visual field; OCT optical coherence tomography; IOP intraocular pressure; BRVO Branch retinal vein occlusion; CRVO central retinal vein occlusion; CRAO central retinal artery occlusion; BRAO branch retinal artery occlusion; RT retinal tear; SB scleral buckle; PPV pars plana vitrectomy; VH Vitreous hemorrhage; PRP panretinal laser photocoagulation; IVK intravitreal kenalog; VMT vitreomacular traction; MH Macular hole;  NVD neovascularization of the disc; NVE neovascularization elsewhere; AREDS age related eye disease study; ARMD age related macular degeneration; POAG primary open angle glaucoma; EBMD epithelial/anterior basement membrane dystrophy; ACIOL anterior chamber intraocular lens; IOL intraocular lens; PCIOL posterior chamber intraocular lens; Phaco/IOL phacoemulsification with intraocular lens placement; [place] photorefractive keratectomy; LASIK laser assisted in situ keratomileusis; HTN hypertension; DM diabetes mellitus; COPD chronic obstructive pulmonary disease

## 2020-11-14 NOTE — Assessment & Plan Note (Signed)
OD, repaired via scleral buckle 07-28-2020 looks great with excellent visual acuity stabilization and improvement  Follow Dr. Celine Ahr. Patel as scheduled

## 2020-11-14 NOTE — Assessment & Plan Note (Signed)
Follow-up with Dr. Virgina Evener as scheduled

## 2020-11-14 NOTE — Assessment & Plan Note (Signed)
OS, no signs of hole or tear undilated funduscopic examination today

## 2020-11-21 ENCOUNTER — Encounter: Payer: Self-pay | Admitting: Podiatry

## 2020-11-21 ENCOUNTER — Other Ambulatory Visit: Payer: Self-pay

## 2020-11-21 ENCOUNTER — Ambulatory Visit: Payer: BC Managed Care – PPO | Admitting: Podiatry

## 2020-11-21 DIAGNOSIS — M79674 Pain in right toe(s): Secondary | ICD-10-CM

## 2020-11-21 DIAGNOSIS — E1149 Type 2 diabetes mellitus with other diabetic neurological complication: Secondary | ICD-10-CM | POA: Diagnosis not present

## 2020-11-21 DIAGNOSIS — B351 Tinea unguium: Secondary | ICD-10-CM

## 2020-11-21 DIAGNOSIS — M79675 Pain in left toe(s): Secondary | ICD-10-CM | POA: Diagnosis not present

## 2020-11-21 DIAGNOSIS — E1140 Type 2 diabetes mellitus with diabetic neuropathy, unspecified: Secondary | ICD-10-CM | POA: Diagnosis not present

## 2020-11-21 NOTE — Progress Notes (Signed)
This patient returns to my office for at risk foot care.  This patient requires this care by a professional since this patient will be at risk due to having diabetic neuropathy. This patient is unable to cut nails himself since the patient cannot reach his nails.These nails are painful walking and wearing shoes.  This patient presents for at risk foot care today.  General Appearance  Alert, conversant and in no acute stress.  Vascular  Dorsalis pedis and posterior tibial  pulses are palpable  bilaterally.  Capillary return is within normal limits  bilaterally. Temperature is within normal limits  bilaterally.  Neurologic  Senn-Weinstein monofilament wire test diminished  bilaterally. Muscle power within normal limits bilaterally.  Nails Thick disfigured discolored nails with subungual debris  from hallux to fifth toes bilaterally. No evidence of bacterial infection or drainage bilaterally.  Orthopedic  No limitations of motion  feet .  No crepitus or effusions noted.  No bony pathology or digital deformities noted.  Skin  normotropic skin with no porokeratosis noted bilaterally.  No signs of infections or ulcers noted.     Onychomycosis  Pain in right toes  Pain in left toes  Consent was obtained for treatment procedures.   Mechanical debridement of nails 1-5  bilaterally performed with a nail nipper.  Filed with dremel without incident.    Return office visit   10 weeks                   Told patient to return for periodic foot care and evaluation due to potential at risk complications.   Gardiner Barefoot DPM

## 2020-12-09 ENCOUNTER — Ambulatory Visit: Payer: BC Managed Care – PPO | Admitting: Podiatry

## 2021-03-01 ENCOUNTER — Encounter: Payer: Self-pay | Admitting: Podiatry

## 2021-03-01 ENCOUNTER — Ambulatory Visit (INDEPENDENT_AMBULATORY_CARE_PROVIDER_SITE_OTHER): Payer: BC Managed Care – PPO | Admitting: Podiatry

## 2021-03-01 ENCOUNTER — Other Ambulatory Visit: Payer: Self-pay

## 2021-03-01 DIAGNOSIS — B351 Tinea unguium: Secondary | ICD-10-CM | POA: Diagnosis not present

## 2021-03-01 DIAGNOSIS — M79675 Pain in left toe(s): Secondary | ICD-10-CM | POA: Diagnosis not present

## 2021-03-01 DIAGNOSIS — E1149 Type 2 diabetes mellitus with other diabetic neurological complication: Secondary | ICD-10-CM | POA: Diagnosis not present

## 2021-03-01 DIAGNOSIS — M79674 Pain in right toe(s): Secondary | ICD-10-CM

## 2021-03-01 DIAGNOSIS — E1140 Type 2 diabetes mellitus with diabetic neuropathy, unspecified: Secondary | ICD-10-CM

## 2021-03-06 NOTE — Progress Notes (Signed)
Subjective: Erika [name] is a 55 y.o. female patient seen today for follow up of painful thick toenails that are difficult to trim. Pain interferes with ambulation. Aggravating factors include wearing enclosed shoe gear. Pain is relieved with periodic professional debridement.  Patient states their blood glucose was 140 mg/dl on yesterday. She has not checked it today.  She is requesting new Rx for compounded topical antifungal solution from [place] today.  PCP is Martinique, Betty G, MD.  New problems reported today: None.  Allergies  Allergen Reactions   Codeine Other (See Comments)    dizzy Other reaction(s): too strong/makes dizzy   Latex Hives   Other Other (See Comments)    Allergy to msg   Reaction- headache Allergy to saccharin-  Reaction- headache Other reaction(s): rash   Benadryl [Diphenhydramine] Rash   Nitrofurantoin Monohyd Macro Rash   Sulfa Antibiotics Rash    PCP is Martinique, Betty G, MD .  Objective: Physical Exam  General: Patient is a pleasant 55 y.o. Caucasian female in NAD. AAO x 3.   Neurovascular Examination: Capillary fill time to digits <3 seconds b/l lower extremities. Palpable DP pulse(s) b/l lower extremities Palpable PT pulse(s) b/l lower extremities Pedal hair sparse. Lower extremity skin temperature gradient within normal limits. No pain with calf compression b/l. No edema noted b/l lower extremities.  Pt has subjective symptoms of neuropathy. Protective sensation diminished with 10g monofilament b/l.  Dermatological:  Skin warm and supple b/l lower extremities. No open wounds b/l lower extremities. No interdigital macerations b/l lower extremities. Toenails L hallux, L 5th toe, R hallux, and R 5th toe elongated, discolored, dystrophic, thickened, and crumbly with subungual debris and tenderness to dorsal palpation.  Musculoskeletal:  Normal muscle strength 5/5 to all lower extremity muscle groups bilaterally. No pain crepitus or joint  limitation noted with ROM b/l lower extremities. No gross bony deformities b/l lower extremities.  Assessment: 1. Pain due to onychomycosis of toenails of both feet   2. Diabetic neuropathy with neurologic complication [facility])    Plan: Patient was evaluated and treated and all questions answered. Consent given for treatment as described below: -Examined patient. -Continue diabetic foot care principles: inspect feet daily, monitor glucose as recommended by PCP and/or Endocrinologist, and follow prescribed diet per PCP, Endocrinologist and/or dietician. -Patient instructed to continue topical antifungal solution to toenails once daily from [place]. New Rx faxed to [facility] today. -Toenails L hallux, L 5th toe, R hallux, and R 5th toe debrided in length and girth without iatrogenic bleeding with sterile nail nipper and dremel.  -Patient to report any pedal injuries to medical professional immediately. -Patient/POA to call should there be question/concern in the interim.  Return in about 3 months (around 05/31/2021).  Marzetta Board, DPM

## 2021-05-22 ENCOUNTER — Encounter (INDEPENDENT_AMBULATORY_CARE_PROVIDER_SITE_OTHER): Payer: BC Managed Care – PPO | Admitting: Ophthalmology

## 2021-06-13 ENCOUNTER — Ambulatory Visit (INDEPENDENT_AMBULATORY_CARE_PROVIDER_SITE_OTHER): Payer: BC Managed Care – PPO | Admitting: Podiatry

## 2021-06-13 ENCOUNTER — Other Ambulatory Visit: Payer: Self-pay

## 2021-06-13 DIAGNOSIS — M79674 Pain in right toe(s): Secondary | ICD-10-CM

## 2021-06-13 DIAGNOSIS — E1149 Type 2 diabetes mellitus with other diabetic neurological complication: Secondary | ICD-10-CM | POA: Diagnosis not present

## 2021-06-13 DIAGNOSIS — B351 Tinea unguium: Secondary | ICD-10-CM

## 2021-06-13 DIAGNOSIS — M79675 Pain in left toe(s): Secondary | ICD-10-CM

## 2021-06-13 DIAGNOSIS — E1140 Type 2 diabetes mellitus with diabetic neuropathy, unspecified: Secondary | ICD-10-CM

## 2021-06-17 ENCOUNTER — Encounter: Payer: Self-pay | Admitting: Podiatry

## 2021-06-17 NOTE — Progress Notes (Signed)
°  Subjective:  Patient ID: Erika [name], female    DOB: June 30, 1965,  MRN: [id number]  Erika [name] presents to clinic today for at risk foot care with history of diabetic neuropathy and thick, elongated toenails bilateral great toes and bilateral 5th toes which are tender when wearing enclosed shoe gear.  Patient states blood glucose was 142 mg/dl today.    PCP is Martinique, Betty G, MD , and last visit was 07/02/2019.  Allergies  Allergen Reactions   Codeine Other (See Comments)    dizzy Other reaction(s): too strong/makes dizzy   Latex Hives   Other Other (See Comments)    Allergy to msg   Reaction- headache Allergy to saccharin-  Reaction- headache Other reaction(s): rash   Benadryl [Diphenhydramine] Rash   Nitrofurantoin Monohyd Macro Rash   Sulfa Antibiotics Rash    Review of Systems: Negative except as noted in the HPI. Objective:   Constitutional [name] is a pleasant 56 y.o. Caucasian female, in NAD. AAO x 3.   Vascular CFT immediate b/l LE. Palpable DP/PT pulses b/l LE. Digital hair sparse b/l. Skin temperature gradient WNL b/l. No pain with calf compression b/l. No edema noted b/l. No cyanosis or clubbing noted b/l LE.  Neurologic Normal speech. Oriented to person, place, and time. Pt has subjective symptoms of neuropathy. Protective sensation diminished with 10g monofilament b/l. Vibratory sensation intact b/l.  Dermatologic Pedal integument with normal turgor, texture and tone b/l LE. No open wounds b/l. No interdigital macerations b/l. Toenails bilateral great toes and bilateral 5th toes elongated, thickened, discolored with subungual debris. +Tenderness with dorsal palpation of nailplates. No hyperkeratotic or porokeratotic lesions present.  Orthopedic: Muscle strength 5/5 to all lower extremity muscle groups bilaterally. No pain, crepitus or joint limitation noted with ROM bilateral LE. No gross bony deformities bilaterally.   Radiographs: None Assessment:   1.  Pain due to onychomycosis of toenails of both feet   2. Diabetic neuropathy with neurologic complication [facility])    Plan:  Patient was evaluated and treated and all questions answered. Consent given for treatment as described below: -Continue foot and shoe inspections daily. Monitor blood glucose per PCP/Endocrinologist's recommendations. -Mycotic toenails bilateral great toes and bilateral 5th toes were debrided in length and girth with sterile nail nippers and dremel without iatrogenic bleeding. -Patient/POA to call should there be question/concern in the interim.  Return in about 3 months (around 09/11/2021).  Marzetta Board, DPM

## 2021-07-06 ENCOUNTER — Other Ambulatory Visit: Payer: Self-pay

## 2021-07-06 ENCOUNTER — Encounter (INDEPENDENT_AMBULATORY_CARE_PROVIDER_SITE_OTHER): Payer: Self-pay | Admitting: Ophthalmology

## 2021-07-06 ENCOUNTER — Ambulatory Visit (INDEPENDENT_AMBULATORY_CARE_PROVIDER_SITE_OTHER): Payer: BC Managed Care – PPO | Admitting: Ophthalmology

## 2021-07-06 DIAGNOSIS — H2513 Age-related nuclear cataract, bilateral: Secondary | ICD-10-CM | POA: Diagnosis not present

## 2021-07-06 DIAGNOSIS — H33021 Retinal detachment with multiple breaks, right eye: Secondary | ICD-10-CM | POA: Diagnosis not present

## 2021-07-06 DIAGNOSIS — E1165 Type 2 diabetes mellitus with hyperglycemia: Secondary | ICD-10-CM | POA: Diagnosis not present

## 2021-07-06 LAB — HM DIABETES EYE EXAM

## 2021-07-06 NOTE — Progress Notes (Signed)
07/06/2021     CHIEF COMPLAINT Patient presents for  Chief Complaint  Patient presents with   Retina Follow Up      HISTORY OF PRESENT ILLNESS: Erika [name] is a 56 y.o. female who presents to the clinic today for:   HPI     Retina Follow Up           Diagnosis: Retinal Break/Detachment   Laterality: right eye   Onset: 7 months ago   Severity: mild   Duration: 7 months   Course: stable         Comments   7 mos fu OU oct fp. Patient states vision is stable and unchanged since last visit. Denies any new floaters or FOL. Pt uses brimonidine BID OU and latanoprost QHS OU.      Last edited by Laurin Coder on 07/06/2021  3:49 PM.      Referring physician: Martinique, Betty G, MD [place],  [place] [id number]  HISTORICAL INFORMATION:   Selected notes from the MEDICAL RECORD NUMBER    Lab Results  Component Value Date   HGBA1C 10.2 10/17/2011     CURRENT MEDICATIONS: Current Outpatient Medications (Ophthalmic Drugs)  Medication Sig   brimonidine (ALPHAGAN) 0.2 % ophthalmic solution INSTILL 1 DROP IN BOTH EYES FOUR TIMES A DAY   latanoprost (XALATAN) 0.005 % ophthalmic solution Place 1 drop into both eyes at bedtime.   loteprednol (LOTEMAX) 0.5 % ophthalmic suspension Place 1 drop into the right eye 4 (four) times daily.   prednisoLONE acetate (PRED FORTE) 1 % ophthalmic suspension Place 1 drop into the right eye 4 (four) times daily. (Patient not taking: Reported on 11/14/2020)   No current facility-administered medications for this visit. (Ophthalmic Drugs)   Current Outpatient Medications (Other)  Medication Sig   acetaminophen (TYLENOL) 325 MG tablet 1 tablet as needed   albuterol (VENTOLIN HFA) 108 (90 Base) MCG/ACT inhaler Inhale 2 puffs into the lungs every 6 (six) hours as needed for wheezing or shortness of breath.   amoxicillin-clavulanate (AUGMENTIN) 875-125 MG tablet Take 1 tablet by mouth 2 (two) times daily.   atorvastatin  (LIPITOR) 10 MG tablet Take 1 tablet by mouth at bedtime.   BD PEN NEEDLE NANO 2ND GEN 32G X 4 MM MISC USE TO ADMINSTER INSULIN ONCE A DAY SUBCUTANEOUS AS DIRECTED   chlorhexidine (PERIDEX) 0.12 % solution SMARTSIG:0.5 By Mouth Twice Daily   cholecalciferol (VITAMIN D) 1000 units tablet Take 1,000 Units by mouth daily.   cholecalciferol (VITAMIN D) 25 MCG (1000 UNIT) tablet 1 tablet   cyclobenzaprine (FLEXERIL) 10 MG tablet Take 1 tablet by mouth 3 (three) times daily as needed.   Dapagliflozin-metFORMIN HCl ER (XIGDUO XR) 10-998 MG TB24 2 tablets   fexofenadine (ALLEGRA ALLERGY) 180 MG tablet 1 tablet as needed   fexofenadine (ALLEGRA) 180 MG tablet Take 180 mg by mouth daily.   fluconazole (DIFLUCAN) 150 MG tablet 1 tablet   flurbiprofen (ANSAID) 100 MG tablet Take 100 mg by mouth 2 (two) times daily.   fluticasone (FLONASE) 50 MCG/ACT nasal spray Place into the nose.   glucose blood test strip For use when checking blood glucose   ibuprofen (ADVIL) 200 MG tablet 1 tablet with food or milk as needed   Icosapent Ethyl (VASCEPA PO) 2 capsules   Insulin Glargine (BASAGLAR KWIKPEN) 100 UNIT/ML SOPN INJECT 100 UNITS (TITRATE AS DIRECTED, MAX DAILY DOSE OF 100 UNITS) ONCE A DAY   ipratropium (ATROVENT) 0.03 %  nasal spray USE 2 SPRAYS INTO EACH NOSTRIL EVERY 12 (TWELVE) HOURS.   levothyroxine (SYNTHROID, LEVOTHROID) 25 MCG tablet TAKE 1 TABLET BY MOUTH EVERY DAY   Molnupiravir 200 MG CAPS SMARTSIG:4 Each By Mouth Twice Daily   montelukast (SINGULAIR) 10 MG tablet Take 1 tablet (10 mg total) by mouth at bedtime.   naproxen sodium (ANAPROX) 550 MG tablet Take 550 mg by mouth 2 (two) times daily as needed.   NONFORMULARY OR COMPOUNDED ITEM Antifungal solution: Terbinafine 3%, Fluconazole 2%, Tea Tree Oil 5%, Urea 10%, Ibuprofen 2% in DMSO suspension #29HB   OneTouch Delica Lancets 71I MISC For use when checking blood glucose   polyethylene glycol powder (GLYCOLAX/MIRALAX) 17 GM/SCOOP powder Take 17  grams po prn   pseudoephedrine (SUDAFED) 30 MG tablet Take by mouth.   QUICKVUE AT-HOME COVID-19 TEST KIT See admin instructions.   Spacer/Aero-Holding Chambers (AEROCHAMBER PLUS) inhaler Use as instructed to use with inahaler.   Spacer/Aero-Holding Chambers (AEROCHAMBER PLUS) inhaler use with inhalor   SYMBICORT 80-4.5 MCG/ACT inhaler TAKE 2 PUFFS BY MOUTH TWICE A DAY   terbinafine (LAMISIL) 250 MG tablet TAKE 1 TABLET DAILY FOR 7 DAYS, REPEAT EVERY 4 WEEKS X 4 MONTHS   triamterene-hydrochlorothiazide (MAXZIDE-25) 37.5-25 MG tablet Take 1 tablet by mouth every morning.   TRULICITY 3 RC/7.8LF SOPN Inject into the skin.   Turmeric 500 MG CAPS 1 capsule   VASCEPA 1 g capsule Take 1 g by mouth 2 (two) times daily.   XIGDUO XR 10-998 MG TB24 Take 1 tablet by mouth 2 (two) times daily.   No current facility-administered medications for this visit. (Other)      REVIEW OF SYSTEMS:    ALLERGIES Allergies  Allergen Reactions   Codeine Other (See Comments)    dizzy Other reaction(s): too strong/makes dizzy   Latex Hives   Other Other (See Comments)    Allergy to msg   Reaction- headache Allergy to saccharin-  Reaction- headache Other reaction(s): rash   Benadryl [Diphenhydramine] Rash   Nitrofurantoin Monohyd Macro Rash   Sulfa Antibiotics Rash    PAST MEDICAL HISTORY Past Medical History:  Diagnosis Date   Arthritis    hands, neck   Asthma    no inhaler, no problems x 5 years.   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   Radiation and surgery   Fibroid    Goiter    Heart murmur    never had any problems   Hyperlipidemia    Hypertension    Hypothyroid    Infertility, female    Neuromuscular disorder (HCC)    neuropathy in feet and finger tips   Neuropathy    located fingers and toes   Ocular hypertension     PCOS (polycystic ovarian syndrome)    Seasonal allergies    Umbilical hernia    Urinary incontinence    Past Surgical History:  Procedure Laterality Date   BACK SURGERY  12/1997,01/12/1998,01/20/1998   x 3 surgeries - L4/L5/L6   BREAST BIOPSY Right 12/14/2004   COLPOSCOPY     DILATATION & CURETTAGE/HYSTEROSCOPY WITH MYOSURE N/A 12/14/2014   Procedure: [place];  Surgeon: [name], MD;  Location: [facility];  Service: Gynecology;  Laterality: N/A;  to follow 2nd case 10:00?  needs one  hour OR time   DILATION AND CURETTAGE OF UTERUS  2010   endometrial polyp   HYSTEROSCOPY  2004   MYOMECTOMY  2004   OVARY SURGERY  09/1998   bilateral ovarian transposition due to radiation treatment for spine lesion   WISDOM TOOTH EXTRACTION      FAMILY HISTORY Family History  Problem Relation Age of Onset   Lung cancer Mother    Hypertension Maternal Aunt    Breast cancer Maternal Aunt        Age 26   Thyroid disease Maternal Aunt    Colon cancer Maternal Aunt    Lung cancer Maternal Uncle    Esophageal cancer Maternal Uncle    Cirrhosis Maternal Aunt    Colon cancer Maternal Aunt    Throat cancer Brother     SOCIAL HISTORY Social History   Tobacco Use   Smoking status: Former    Packs/day: 0.25    Years: 9.00    Pack years: 2.25    Types: Cigarettes    Quit date: 08/22/1985    Years since quitting: 35.8   Smokeless tobacco: Never  Vaping Use   Vaping Use: Never used  Substance Use Topics   Alcohol use: Yes    Alcohol/week: 0.0 standard drinks    Comment: rare wine   Drug use: No         OPHTHALMIC EXAM:  Base Eye Exam     Visual Acuity (ETDRS)       Right Left   Dist [place] 20/30 20/25 -2         Tonometry (Tonopen, 3:48 PM)       Right Left   Pressure 20 22         Pupils       Pupils Dark Light APD   Right PERRL 5 4 None   Left PERRL 5 4 None         Extraocular Movement       Right Left    Full Full          Neuro/Psych     Oriented x3: Yes   Mood/Affect: Normal         Dilation     Both eyes: 1.0% Mydriacyl, 2.5% Phenylephrine @ 3:48 PM           Slit Lamp and Fundus Exam     External Exam       Right Left   External Periorbital edema, Ecchymosis          Slit Lamp Exam       Right Left   Lids/Lashes Ecchymosis Ecchymosis   Conjunctiva/Sclera Trace Injection White and quiet   Cornea Clear Clear   Anterior Chamber Deep and quiet Deep and quiet   Iris Round and reactive Round and reactive   Lens 1+ Nuclear sclerosis 1+ Nuclear sclerosis   Anterior Vitreous Normal Normal         Fundus Exam       Right Left   Posterior Vitreous Normal,  Normal   Disc Normal, nerve pink, vessels perfused Normal   C/D Ratio 0.3 0.3   Macula Macula attached Normal   Vessels Normal Normal   Periphery Scleral buckle temporal, good retinopexy, cryo reaction superotemporal quadrant on buckle, attached, no subretinal fluid Normal            IMAGING AND PROCEDURES  Imaging and Procedures for 07/06/21  OCT, Retina - OU - Both Eyes  Right Eye Quality was good. Scan locations included subfoveal. Central Foveal Thickness: 243. Progression has been stable. Findings include abnormal foveal contour.   Left Eye Quality was good. Scan locations included subfoveal. Central Foveal Thickness: 232. Progression has been stable. Findings include normal foveal contour.   Notes Very small region of loculated subretinal fluid from previous macular involving retinal detachment.  This area will continue to slowly resolve sometimes up to 18 to 24 months post surgery OD.  Not involving the fovea.  Observe  No epiretinal membrane, macular contours otherwise normal OU     Color Fundus Photography Optos - OU - Both Eyes       Right Eye Progression has been stable. Disc findings include normal observations. Macula : normal observations. Vessels : normal observations.   Left  Eye Progression has improved. Disc findings include normal observations. Macula : normal observations. Vessels : normal observations. Periphery : normal observations.   Notes OD good scleral buckle, good cryopexy temporal superotemporal.  Retina attached.  Medium mostly clear.  OS media clear normal periphery             ASSESSMENT/PLAN:  Age-related nuclear cataract of both eyes OU, mild progression of NSC changes.  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 Age-related nuclear cataract of both eyes  H25.13     3. Type 2 diabetes mellitus with hyperglycemia, unspecified whether long term insulin use (HCC)  E11.65       1.  OD now 1 year post retinal detachment repair looks great. 2.  OU with cataract as well as open-angle glaucoma on 2 medications.  As well as contact lens wear.  I briefly discussed with Dr. Jerline Pain is an excellent physician to discuss with her the potential benefits of cataract surgery with lens placement to assist in clearing of vision, but also decreasing some dependency potentially on the topical antiglaucoma medicine  3.  Ophthalmic Meds Ordered this visit:  No orders of the defined types were placed in this encounter.      Return in about 1 year (around 07/06/2022) for DILATE OU, COLOR FP, OCT.  There are no Patient Instructions on file for this visit.   Explained the diagnoses, plan, and follow up with the patient and they expressed understanding.  Patient expressed understanding of the  importance of proper follow up care.   Clent Demark Karia Ehresman M.D. Diseases & Surgery of the Retina and Vitreous [facility] [place] 07/06/21     Abbreviations: M myopia (nearsighted); A astigmatism; H hyperopia (farsighted); P presbyopia; Mrx spectacle prescription;  CTL contact lenses; OD right eye; OS left eye; OU both eyes  XT exotropia; ET esotropia; PEK punctate epithelial keratitis; PEE punctate epithelial erosions; DES dry eye syndrome; MGD meibomian gland dysfunction; ATs artificial tears; PFAT's preservative free artificial tears; [place] nuclear sclerotic cataract; PSC posterior subcapsular cataract; ERM epi-retinal membrane; PVD posterior vitreous detachment; RD retinal detachment; DM diabetes mellitus; DR diabetic retinopathy; NPDR non-proliferative diabetic retinopathy; PDR proliferative  diabetic retinopathy; CSME clinically significant macular edema; DME diabetic macular edema; dbh dot blot hemorrhages; CWS cotton wool spot; POAG primary open angle glaucoma; C/D cup-to-disc ratio; HVF humphrey visual field; GVF goldmann visual field; OCT optical coherence tomography; IOP intraocular pressure; BRVO Branch retinal vein occlusion; CRVO central retinal vein occlusion; CRAO central retinal artery occlusion; BRAO branch retinal artery occlusion; RT retinal tear; SB scleral buckle; PPV pars plana vitrectomy; VH Vitreous hemorrhage; PRP panretinal laser photocoagulation; IVK intravitreal kenalog; VMT vitreomacular traction; MH Macular hole;  NVD neovascularization of the disc; NVE neovascularization elsewhere; AREDS age related eye disease study; ARMD age related macular degeneration; POAG primary open angle glaucoma; EBMD epithelial/anterior basement membrane dystrophy; ACIOL anterior chamber intraocular lens; IOL intraocular lens; PCIOL posterior chamber intraocular lens; Phaco/IOL phacoemulsification with intraocular lens placement; [place] photorefractive keratectomy; LASIK laser assisted in situ  keratomileusis; HTN hypertension; DM diabetes mellitus; COPD chronic obstructive pulmonary disease

## 2021-07-06 NOTE — Assessment & Plan Note (Signed)
OD retinal detachment repaired nicely via scleral buckle, no new recurrences

## 2021-07-06 NOTE — Assessment & Plan Note (Signed)
OU, mild progression of NSC changes.  May impart some darkening to her nighttime vision but otherwise acuity is great  I did explain the patient that cataract surgery with lens placement will be accompanied by an improvement in glaucoma control

## 2021-09-13 ENCOUNTER — Ambulatory Visit (INDEPENDENT_AMBULATORY_CARE_PROVIDER_SITE_OTHER): Payer: BC Managed Care – PPO | Admitting: Podiatry

## 2021-09-13 ENCOUNTER — Encounter: Payer: Self-pay | Admitting: Podiatry

## 2021-09-13 DIAGNOSIS — B351 Tinea unguium: Secondary | ICD-10-CM

## 2021-09-13 DIAGNOSIS — E1149 Type 2 diabetes mellitus with other diabetic neurological complication: Secondary | ICD-10-CM | POA: Diagnosis not present

## 2021-09-13 DIAGNOSIS — E1140 Type 2 diabetes mellitus with diabetic neuropathy, unspecified: Secondary | ICD-10-CM | POA: Diagnosis not present

## 2021-09-13 NOTE — Patient Instructions (Signed)
Terbinafine Tablets What is this medication? TERBINAFINE (TER bin a feen) treats fungal infections of the nails. It belongs to a group of medications called antifungals. It will not treat infections caused by bacteria or viruses. This medicine may be used for other purposes; ask your health care provider or pharmacist if you have questions. COMMON BRAND NAME(S): Lamisil, Terbinex What should I tell my care team before I take this medication? They need to know if you have any of these conditions: Liver disease An unusual or allergic reaction to terbinafine, other medications, foods, dyes, or preservatives Pregnant or trying to get pregnant Breast-feeding How should I use this medication? Take this medication by mouth with water. Take it as directed on the prescription label at the same time every day. You can take it with or without food. If it upsets your stomach, take it with food. Keep taking it unless your care team tells you to stop. A special MedGuide will be given to you by the pharmacist with each prescription and refill. Be sure to read this information carefully each time. Talk to your care team regarding the use of this medication in children. Special care may be needed. Overdosage: If you think you have taken too much of this medicine contact a poison control center or emergency room at once. NOTE: This medicine is only for you. Do not share this medicine with others. What if I miss a dose? If you miss a dose, take it as soon as you can unless it is more than 4 hours late. If it is more than 4 hours late, skip the missed dose. Take the next dose at the normal time. What may interact with this medication? Do not take this medication with any of the following: Pimozide Thioridazine This medication may also interact with the following: Beta blockers Caffeine Certain medications for mental health conditions Cimetidine Cyclosporine Medications for fungal infections like fluconazole  and ketoconazole Medications for irregular heartbeat like amiodarone, flecainide and propafenone Rifampin Warfarin This list may not describe all possible interactions. Give your health care provider a list of all the medicines, herbs, non-prescription drugs, or dietary supplements you use. Also tell them if you smoke, drink alcohol, or use illegal drugs. Some items may interact with your medicine. What should I watch for while using this medication? Visit your care team for regular checks on your progress. You may need blood work while you are taking this medication. It may be some time before you see the benefit from this medication. This medication may cause serious skin reactions. They can happen weeks to months after starting the medication. Contact your care team right away if you notice fevers or flu-like symptoms with a rash. The rash may be red or purple and then turn into blisters or peeling of the skin. Or, you might notice a red rash with swelling of the face, lips or lymph nodes in your neck or under your arms. This medication can make you more sensitive to the sun. Keep out of the sun, If you cannot avoid being in the sun, wear protective clothing and sunscreen. Do not use sun lamps or tanning beds/booths. What side effects may I notice from receiving this medication? Side effects that you should report to your care team as soon as possible: Allergic reactions--skin rash, itching, hives, swelling of the face, lips, tongue, or throat Change in sense of smell Change in taste Infection--fever, chills, cough, or sore throat Liver injury--right upper belly pain, loss of appetite, nausea,   light-colored stool, dark yellow or brown urine, yellowing skin or eyes, unusual weakness or fatigue ?Low red blood cell level--unusual weakness or fatigue, dizziness, headache, trouble breathing ?Lupus-like syndrome--joint pain, swelling, or stiffness, butterfly-shaped rash on the face, rashes that get worse  in the sun, fever, unusual weakness or fatigue ?Rash, fever, and swollen lymph nodes ?Redness, blistering, peeling, or loosening of the skin, including inside the mouth ?Unusual bruising or bleeding ?Worsening mood, feelings of depression ?Side effects that usually do not require medical attention (report to your care team if they continue or are bothersome): ?Diarrhea ?Gas ?Headache ?Nausea ?Stomach pain ?Upset stomach ?This list may not describe all possible side effects. Call your doctor for medical advice about side effects. You may report side effects to FDA at 1-800-FDA-1088. ?Where should I keep my medication? ?Keep out of the reach of children and pets. ?Store between 20 and 25 degrees C (68 and 77 degrees F). Protect from light. Get rid of any unused medication after the expiration date. ?To get rid of medications that are no longer needed or have expired: ?Take the medication to a medication take-back program. Check with your pharmacy or law enforcement to find a location. ?If you cannot return the medication, check the label or package insert to see if the medication should be thrown out in the garbage or flushed down the toilet. If you are not sure, ask your care team. If it is safe to put it in the trash, take the medication out of the container. Mix the medication with cat litter, dirt, coffee grounds, or other unwanted substance. Seal the mixture in a bag or container. Put it in the trash. ?NOTE: This sheet is a summary. It may not cover all possible information. If you have questions about this medicine, talk to your doctor, pharmacist, or health care provider. ?? 2022 Elsevier/Gold Standard (2021-01-11 00:00:00) ? ?

## 2021-09-18 NOTE — Progress Notes (Signed)
?  Subjective:  ?Patient ID: Erika [name], female    DOB: 05-Feb-1966,  MRN: [id number] ? ?Erika [name] presents to clinic today for at risk foot care with history of diabetic neuropathy and painful thick toenails that are difficult to trim. Pain interferes with ambulation. Aggravating factors include wearing enclosed shoe gear. Pain is relieved with periodic professional debridement. ? ?Last HgA1c was 8.0%. Patient did not check blood glucose today. ? ?She would like to discuss starting oral medication for treatment of toenail fungus. She states she will be having blood work performed by Dr. Buddy Duty on September 28, 2021. ? ?PCP is [place], Betty G, MD , and last visit was July 07, 2019. ? ?Allergies  ?Allergen Reactions  ? Codeine Other (See Comments)  ?  dizzy ?Other reaction(s): too strong/makes dizzy  ? Latex Hives  ? Other Other (See Comments)  ?  Allergy to msg   Reaction- headache ?Allergy to saccharin-  Reaction- headache ?Other reaction(s): rash  ? Benadryl [Diphenhydramine] Rash  ? Nitrofurantoin Monohyd Macro Rash  ? Sulfa Antibiotics Rash  ? ? ?Review of Systems: Negative except as noted in the HPI. ? ?Objective: No changes noted in today's physical examination. ?Constitutional Erika [name] is a pleasant 56 y.o. Caucasian female, in NAD. AAO x 3.   ?Vascular CFT immediate b/l LE. Palpable DP/PT pulses b/l LE. Digital hair sparse b/l. Skin temperature gradient WNL b/l. No pain with calf compression b/l. No edema noted b/l. No cyanosis or clubbing noted b/l LE.  ?Neurologic Normal speech. Oriented to person, place, and time. Pt has subjective symptoms of neuropathy. Protective sensation diminished with 10g monofilament b/l. Vibratory sensation intact b/l.  ?Dermatologic Pedal integument with normal turgor, texture and tone b/l LE. No open wounds b/l. No interdigital macerations b/l. Toenails bilateral great toes and bilateral 5th toes elongated, thickened, discolored with subungual debris. +Tenderness with  dorsal palpation of nailplates. No hyperkeratotic or porokeratotic lesions present.  ?Orthopedic: Muscle strength 5/5 to all lower extremity muscle groups bilaterally. No pain, crepitus or joint limitation noted with ROM bilateral LE. No gross bony deformities bilaterally.  ? ?Radiographs: None ? ?Assessment/Plan: ?1. Onychomycosis   ?2. Diabetic neuropathy with neurologic complication ([place])   ?-Patient was evaluated and treated. All patient's and/or POA's questions/concerns answered on today's visit. ?-Discussed topical, laser and oral medication. Patient opted for oral terbinafine therapy. Discussed therapy course of one 250 mg tablet once daily for 90 days. Patient understands this is a 90 day treatment course. LFTs ordered today. She states she would like to get blood work ordered when she gets bloodwork done on April 20th for Dr. Buddy Duty. Pending normal results, will call in Rx for terbinafine 250 mg tablets, #90, to be taken once daily for 90 days. ?-Toenails 1-5 b/l were debrided in length and girth with sterile nail nippers and dremel without iatrogenic bleeding.  ?-Patient/POA to call should there be question/concern in the interim.  ? ?Return in about 3 months (around 12/13/2021). ? ?Marzetta Board, DPM  ?

## 2021-12-20 ENCOUNTER — Ambulatory Visit: Payer: BC Managed Care – PPO | Admitting: Podiatry

## 2022-01-16 ENCOUNTER — Encounter: Payer: Self-pay | Admitting: Podiatry

## 2022-01-16 ENCOUNTER — Ambulatory Visit (INDEPENDENT_AMBULATORY_CARE_PROVIDER_SITE_OTHER): Payer: BC Managed Care – PPO | Admitting: Podiatry

## 2022-01-16 ENCOUNTER — Ambulatory Visit: Payer: BC Managed Care – PPO | Admitting: Podiatry

## 2022-01-16 DIAGNOSIS — E1149 Type 2 diabetes mellitus with other diabetic neurological complication: Secondary | ICD-10-CM | POA: Diagnosis not present

## 2022-01-16 DIAGNOSIS — E119 Type 2 diabetes mellitus without complications: Secondary | ICD-10-CM

## 2022-01-16 DIAGNOSIS — E1140 Type 2 diabetes mellitus with diabetic neuropathy, unspecified: Secondary | ICD-10-CM | POA: Diagnosis not present

## 2022-01-16 DIAGNOSIS — B351 Tinea unguium: Secondary | ICD-10-CM

## 2022-01-22 NOTE — Progress Notes (Signed)
ANNUAL DIABETIC FOOT EXAM  Subjective: Erika [name] presents today for annual diabetic foot examination.  Patient confirms h/o diabetes.  Patient relates 13 year h/o diabetes.  Patient has h/o foot ulcer.  Patient has been diagnosed with neuropathy.  Last known  HgA1c was unknown.  Patient did not check blood glucose this morning.  Risk factors: diabetes, neuropathy, HTN, dyslipidemia, h/o tobacco use in remission.  Erika [name], Erika G, MD is patient's PCP. Last visit was July 07, 2019.  Past Medical History:  Diagnosis Date   Arthritis    hands, neck   Asthma    no inhaler, no problems x 5 years.   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   Radiation and surgery   Fibroid    Goiter    Heart murmur    never had any problems   Hyperlipidemia    Hypertension    Hypothyroid    Infertility, female    Neuromuscular disorder ([place])    neuropathy in feet and finger tips   Neuropathy    located fingers and toes   Ocular hypertension    PCOS (polycystic ovarian syndrome)    Seasonal allergies    Umbilical hernia    Urinary incontinence    Patient Active Problem List   Diagnosis Date Noted   Vitreous floaters of left eye 11/14/2020   Age-related nuclear cataract of both eyes 11/14/2020   Postoperative follow-up 07/29/2020   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 07/27/2020   Dyslipidemia 05/25/2020   Dysphagia 05/25/2020   Hyperglycemia due to type 2 diabetes mellitus ([place]) 05/25/2020   Hypertriglyceridemia 05/25/2020   Multinodular goiter 05/25/2020   Obesity 05/25/2020   Diabetes mellitus ([place]) 12/25/2016   Epidermoid cyst of skin of ear 12/25/2016   Goiter 12/25/2016   Laryngopharyngeal reflux (LPR) 12/25/2016   Incisional hernia 03/20/2013   Hypertension 08/09/2011   PCOS (polycystic  ovarian syndrome) 08/09/2011   Past Surgical History:  Procedure Laterality Date   BACK SURGERY  12/1997,01/12/1998,01/20/1998   x 3 surgeries - L4/L5/L6   BREAST BIOPSY Right 12/14/2004   COLPOSCOPY     DILATATION & CURETTAGE/HYSTEROSCOPY WITH MYOSURE N/A 12/14/2014   Procedure: DILATATION & CURETTAGE/HYSTEROSCOPY WITH MYOSURE;  Surgeon: [name], MD;  Location: [facility];  Service: Gynecology;  Laterality: N/A;  to follow 2nd case 10:00?  needs one hour OR time   [place] OF UTERUS  2010   endometrial polyp   HYSTEROSCOPY  2004   MYOMECTOMY  2004   OVARY SURGERY  09/1998   bilateral ovarian transposition due to radiation treatment for spine lesion   WISDOM TOOTH EXTRACTION     Current Outpatient Medications on File Prior to Visit  Medication Sig Dispense Refill   acetaminophen (TYLENOL) 325 MG tablet 1 tablet as needed     albuterol (VENTOLIN HFA) 108 (90 Base) MCG/ACT inhaler Inhale 2 puffs into the lungs every 6 (six) hours as needed for wheezing or shortness of breath. 18 [name] 1   amoxicillin-clavulanate (AUGMENTIN) 875-125 MG tablet Take 1 tablet by mouth 2 (two) times daily.     atorvastatin (LIPITOR) 10 MG tablet Take 1 tablet by mouth at bedtime.     BD PEN NEEDLE NANO 2ND GEN 32G X 4 MM MISC USE TO ADMINSTER  INSULIN ONCE A DAY SUBCUTANEOUS AS DIRECTED     brimonidine (ALPHAGAN) 0.2 % ophthalmic solution INSTILL 1 DROP IN BOTH EYES FOUR TIMES A DAY     chlorhexidine (PERIDEX) 0.12 % solution SMARTSIG:0.5 By Mouth Twice Daily     cholecalciferol (VITAMIN D) 1000 units tablet Take 1,000 Units by mouth daily.     cholecalciferol (VITAMIN D) 25 MCG (1000 UNIT) tablet 1 tablet     cyclobenzaprine (FLEXERIL) 10 MG tablet Take 1 tablet by mouth 3 (three) times daily as needed.     Dapagliflozin-metFORMIN HCl ER (XIGDUO XR) 10-998 MG TB24 2 tablets     fexofenadine (ALLEGRA ALLERGY) 180 MG tablet 1 tablet as needed     fexofenadine (ALLEGRA) 180 MG tablet Take 180 mg by  mouth daily.     fluconazole (DIFLUCAN) 150 MG tablet 1 tablet     flurbiprofen (ANSAID) 100 MG tablet Take 100 mg by mouth 2 (two) times daily.     fluticasone (FLONASE) 50 MCG/ACT nasal spray Place into the nose.     glucose blood test strip For use when checking blood glucose     ibuprofen (ADVIL) 200 MG tablet 1 tablet with food or milk as needed     Icosapent Ethyl (VASCEPA PO) 2 capsules     Insulin Glargine (BASAGLAR KWIKPEN) 100 UNIT/ML SOPN INJECT 100 UNITS (TITRATE AS DIRECTED, MAX DAILY DOSE OF 100 UNITS) ONCE A DAY     ipratropium (ATROVENT) 0.03 % nasal spray USE 2 SPRAYS INTO EACH NOSTRIL EVERY 12 (TWELVE) HOURS.     latanoprost (XALATAN) 0.005 % ophthalmic solution Place 1 drop into both eyes at bedtime.     levothyroxine (SYNTHROID, LEVOTHROID) 25 MCG tablet TAKE 1 TABLET BY MOUTH EVERY DAY 30 tablet 0   Molnupiravir 200 MG CAPS SMARTSIG:4 Each By Mouth Twice Daily     montelukast (SINGULAIR) 10 MG tablet Take 1 tablet (10 mg total) by mouth at bedtime. 30 tablet 11   naproxen sodium (ANAPROX) 550 MG tablet Take 550 mg by mouth 2 (two) times daily as needed.     NONFORMULARY OR COMPOUNDED ITEM Antifungal solution: Terbinafine 3%, Fluconazole 2%, Tea Tree Oil 5%, Urea 10%, Ibuprofen 2% in DMSO suspension #32m 1 each 3   OneTouch Delica Lancets 359YMISC For use when checking blood glucose     polyethylene glycol powder (GLYCOLAX/MIRALAX) 17 GM/SCOOP powder Take 17 grams po prn     prednisoLONE acetate (PRED FORTE) 1 % ophthalmic suspension Place 1 drop into the right eye 4 (four) times daily. (Patient not taking: Reported on 11/14/2020) 5 mL 0   pseudoephedrine (SUDAFED) 30 MG tablet Take by mouth.     QUICKVUE AT-HOME COVID-19 TEST KIT See admin instructions.     Spacer/Aero-Holding Chambers (AEROCHAMBER PLUS) inhaler Use as instructed to use with inahaler. 1 each 1   Spacer/Aero-Holding Chambers (AEROCHAMBER PLUS) inhaler use with inhalor     SYMBICORT 80-4.5 MCG/ACT inhaler TAKE  2 PUFFS BY MOUTH TWICE A DAY 10.2 each 2   terbinafine (LAMISIL) 250 MG tablet TAKE 1 TABLET DAILY FOR 7 DAYS, REPEAT EVERY 4 WEEKS X 4 MONTHS 28 tablet 0   triamterene-hydrochlorothiazide (MAXZIDE-25) 37.5-25 MG tablet Take 1 tablet by mouth every morning.     TRULICITY 3 MVO/5.9YTSOPN Inject into the skin.     Turmeric 500 MG CAPS 1 capsule     VASCEPA 1 [name] capsule Take 1 [name] by mouth 2 (two) times daily.     XIGDUO XR  10-998 MG TB24 Take 1 tablet by mouth 2 (two) times daily.     No current facility-administered medications on file prior to visit.    Allergies  Allergen Reactions   Codeine Other (See Comments)    dizzy Other reaction(s): too strong/makes dizzy   Latex Hives   Other Other (See Comments)    Allergy to msg   Reaction- headache Allergy to saccharin-  Reaction- headache Other reaction(s): rash   Benadryl [Diphenhydramine] Rash   Nitrofurantoin Monohyd Macro Rash   Sulfa Antibiotics Rash   Social History   Occupational History   Not on file  Tobacco Use   Smoking status: Former    Packs/day: 0.25    Years: 9.00    Total pack years: 2.25    Types: Cigarettes    Quit date: 08/22/1985    Years since quitting: 36.4   Smokeless tobacco: Never  Vaping Use   Vaping Use: Never used  Substance and Sexual Activity   Alcohol use: Yes    Alcohol/week: 0.0 standard drinks of alcohol    Comment: rare wine   Drug use: No   Sexual activity: Not Currently    Birth control/protection: Post-menopausal    Comment: 1st intercourse 89 yo-5 partners   Family History  Problem Relation Age of Onset   Lung cancer Mother    Hypertension Maternal Aunt    Breast cancer Maternal Aunt        Age 80   Thyroid disease Maternal Aunt    Colon cancer Maternal Aunt    Lung cancer Maternal Uncle    Esophageal cancer Maternal Uncle    Cirrhosis Maternal Aunt    Colon cancer Maternal Aunt    Throat cancer Brother    Immunization History  Administered Date(s) Administered   Hepatitis  B, adult 12/06/2016, 01/15/2017   PFIZER(Purple Top)SARS-COV-2 Vaccination 08/08/2019, 08/29/2019     Review of Systems: Negative except as noted in the HPI.   Objective: There were no vitals filed for this visit.  Erika [name] is a pleasant 56 y.o. female in NAD. AAO X 3.  Constitutional Erika [name] is a pleasant 56 y.o. Caucasian female, in NAD. AAO x 3.   Vascular CFT immediate b/l LE. Palpable DP/PT pulses b/l LE. Digital hair sparse b/l. Skin temperature gradient WNL b/l. No pain with calf compression b/l. No edema noted b/l. No cyanosis or clubbing noted b/l LE.  Neurologic Normal speech. Oriented to person, place, and time. Pt has subjective symptoms of neuropathy. Protective sensation diminished with 10g monofilament b/l. Vibratory sensation intact b/l.  Dermatologic Pedal integument with normal turgor, texture and tone b/l LE. No open wounds b/l. No interdigital macerations b/l. Toenails bilateral great toes and bilateral 5th toes elongated, thickened, discolored with subungual debris. +Tenderness with dorsal palpation of nailplates. Evidence of attempted trimming of nails with healed laceration distal tip of right 4th digit as evidenced by dried heme. No erythema, no edema, no drainage, no fluctuance. No hyperkeratotic or porokeratotic lesions present.  Orthopedic: Muscle strength 5/5 to all lower extremity muscle groups bilaterally. No pain, crepitus or joint limitation noted with ROM bilateral LE. No gross bony deformities bilaterally.   Radiographs: None  Footwear Assessment: Does the patient wear appropriate shoes? Yes. Does the patient need inserts/orthotics? No.  ADA Risk Categorization: High Risk  Patient has one or more of the following: Loss of protective sensation Absent pedal pulses Severe Foot deformity History of foot ulcer  Assessment: 1. Pain due to onychomycosis  of toenails of both feet   2. Diabetic neuropathy with neurologic complication ([place])   3.  Encounter for diabetic foot exam ([place])      Plan: -Diabetic foot examination performed today. -Continue foot and shoe inspections daily. Monitor blood glucose per PCP/Endocrinologist's recommendations. -Patient/POA educated on dangers of using sharp instrumentation on toes/feet. Recommended continued professional foot care in presence of diabetes. Patient/POA relates understanding. -Toenails bilateral 2nd toes, bilateral 3rd toes, and bilateral 4th toes debrided in length and girth without iatrogenic bleeding with sterile nail nipper and dremel.  -Mycotic toenails bilateral great toes and bilateral 5th toes were debrided in length and girth with sterile nail nippers and dremel. Pinpoint bleeding of L 5th toe addressed with Lumicain Hemostatic Solution, cleansed with alcohol. triple antibiotic ointment applied. No further treatment required by patient/caregiver. -Patient/POA to call should there be question/concern in the interim. Return in about 3 months (around 04/18/2022).  Erika [name], DPM

## 2022-03-08 ENCOUNTER — Telehealth: Payer: Self-pay | Admitting: Pulmonary Disease

## 2022-03-08 NOTE — Telephone Encounter (Signed)
Patient is wanting to switch  back to Dr. Lake Bells as her provider. Scheduled patient with BQ 10/18 at 2:45pm- please advise if there is a problem with this switch.

## 2022-03-09 NOTE — Telephone Encounter (Signed)
Ok with me 

## 2022-03-09 NOTE — Telephone Encounter (Signed)
Message routed to Dr. Vaughan Browner and Dr. Lake Bells to advise.

## 2022-03-13 NOTE — Telephone Encounter (Signed)
Patient scheduled 03/28/22 with Dr. Lake Bells in a 30 minute slot.  Nothing further at this time.

## 2022-03-28 ENCOUNTER — Encounter: Payer: Self-pay | Admitting: Pulmonary Disease

## 2022-03-28 ENCOUNTER — Ambulatory Visit (INDEPENDENT_AMBULATORY_CARE_PROVIDER_SITE_OTHER): Payer: BC Managed Care – PPO

## 2022-03-28 ENCOUNTER — Ambulatory Visit: Payer: BC Managed Care – PPO | Admitting: Pulmonary Disease

## 2022-03-28 VITALS — BP 136/64 | HR 91 | Ht 64.0 in | Wt 212.0 lb

## 2022-03-28 DIAGNOSIS — J45991 Cough variant asthma: Secondary | ICD-10-CM

## 2022-03-28 DIAGNOSIS — R053 Chronic cough: Secondary | ICD-10-CM

## 2022-03-28 DIAGNOSIS — J309 Allergic rhinitis, unspecified: Secondary | ICD-10-CM | POA: Diagnosis not present

## 2022-03-28 LAB — NITRIC OXIDE: Nitric Oxide: 11

## 2022-03-28 NOTE — Progress Notes (Addendum)
Synopsis: First seen by [facility] in 2017 for cough variant asthma and pulmonary nodules.  Briefly smoked 5 cigarettes a day for 2 years as a young adult.  Subjective:   PATIENT ID: Erika Plan GENDER: female DOB: [birth year], MRN: [id number]   HPI  Chief Complaint  Patient presents with   Establish Care    Previous asthma patient. States she has a cough that will not go away. Cough is caused by certain smells and heat.     Erika [name] saw me years ago for cough which I diagnosed as upper airway cough syndrome. However she keeps having "attacks" of cough where she has sinus congestion and mucus in her chest.  Hot flashes, walking will make it worse. Eating ice helps.  She will get short of breath and tired.  These episodes last for a few minutes at a time, then she'll feel better after. The cough is associated with hoarsenss, dry, scratchy throat.  She stopped allegra to see if that was contributing the cough.  No improvement or worsening in cough since then. She has a lot of drainage running down the back in her throat. She has to sleep sitting up because she will feel choked up. She has a goiter which is followed with recurrent ultrasounds, has benign biopsy from years ago.  However she feels like the goiter might be making things worse. She feels like she can't swallow large pills or viscus medications/syrup consistency fluid.  She works in [employer] (medical) and getting anxious makes the cough worse  She uses ocean nasal spray, is not using flonase regularly No heartburn or indigestion  Record review: Last seen by pulmonary in 2021 for cough variant asthma and pulmonary nodules.  Maintained on Symbicort.  Past Medical History:  Diagnosis Date   Arthritis    hands, neck   Asthma    no inhaler, no problems x 5 years.   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    Cervical dysplasia    Depression    no med   Diabetes mellitus without complication (HCC)    Type 2    Ependymoma of spinal cord (HCC) 1999   Radiation and surgery   Fibroid    Goiter    Heart murmur    never had any problems   Hyperlipidemia    Hypertension    Hypothyroid    Infertility, female    Neuromuscular disorder (HCC)    neuropathy in feet and finger tips   Neuropathy    located fingers and toes   Ocular hypertension    PCOS (polycystic ovarian syndrome)    Seasonal allergies    Umbilical hernia    Urinary incontinence      Family History  Problem Relation Age of Onset   Lung cancer Mother    Hypertension Maternal Aunt    Breast cancer Maternal Aunt        Age 33   Thyroid disease Maternal Aunt    Colon cancer Maternal Aunt    Lung cancer Maternal Uncle    Esophageal cancer Maternal Uncle    Cirrhosis Maternal Aunt    Colon cancer Maternal Aunt    Throat cancer Brother      Social History   Socioeconomic History   Marital status: Married    Spouse name: Not on file   Number of children: Not on file   Years of education: Not on file   Highest education level: Not on file  Occupational History  Not on file  Tobacco Use   Smoking status: Former    Packs/day: 0.25    Years: 9.00    Total pack years: 2.25    Types: Cigarettes    Quit date: 08/22/1985    Years since quitting: 36.6   Smokeless tobacco: Never  Vaping Use   Vaping Use: Never used  Substance and Sexual Activity   Alcohol use: Yes    Alcohol/week: 0.0 standard drinks of alcohol    Comment: rare wine   Drug use: No   Sexual activity: Not Currently    Birth control/protection: Post-menopausal    Comment: 1st intercourse 46 yo-5 partners  Other Topics Concern   Not on file  Social History Narrative   Not on file   Social Determinants of Health   Financial Resource Strain: Not on file  Food Insecurity: Not on file  Transportation Needs: Not on file  Physical Activity: Not on file  Stress: Not on file  Social Connections: Not on file  Intimate Partner Violence: Not on file      Allergies  Allergen Reactions   Codeine Other (See Comments)    dizzy Other reaction(s): too strong/makes dizzy   Latex Hives   Other Other (See Comments)    Allergy to msg   Reaction- headache Allergy to saccharin-  Reaction- headache Other reaction(s): rash   Benadryl [Diphenhydramine] Rash   Nitrofurantoin Monohyd Macro Rash   Sulfa Antibiotics Rash     Outpatient Medications Prior to Visit  Medication Sig Dispense Refill   acetaminophen (TYLENOL) 325 MG tablet 1 tablet as needed     albuterol (VENTOLIN HFA) 108 (90 Base) MCG/ACT inhaler Inhale 2 puffs into the lungs every 6 (six) hours as needed for wheezing or shortness of breath. 18 [name] 1   atorvastatin (LIPITOR) 10 MG tablet Take 1 tablet by mouth at bedtime.     BD PEN NEEDLE NANO 2ND GEN 32G X 4 MM MISC USE TO ADMINSTER INSULIN ONCE A DAY SUBCUTANEOUS AS DIRECTED     brimonidine (ALPHAGAN) 0.2 % ophthalmic solution INSTILL 1 DROP IN BOTH EYES FOUR TIMES A DAY     cholecalciferol (VITAMIN D) 1000 units tablet Take 1,000 Units by mouth daily.     cholecalciferol (VITAMIN D) 25 MCG (1000 UNIT) tablet 1 tablet     cyclobenzaprine (FLEXERIL) 10 MG tablet Take 1 tablet by mouth 3 (three) times daily as needed.     Dapagliflozin-metFORMIN HCl ER (XIGDUO XR) 10-998 MG TB24 2 tablets     Dulaglutide (TRULICITY) 4.5 WG/9.5AO SOPN Inject 4.5 mg into the skin once a week.     fexofenadine (ALLEGRA ALLERGY) 180 MG tablet 1 tablet as needed     fexofenadine (ALLEGRA) 180 MG tablet Take 180 mg by mouth daily.     glucose blood test strip For use when checking blood glucose     ibuprofen (ADVIL) 200 MG tablet 1 tablet with food or milk as needed     Icosapent Ethyl (VASCEPA PO) 2 capsules     Insulin Glargine (BASAGLAR KWIKPEN) 100 UNIT/ML SOPN Inject 10 Units into the skin daily. INJECT 100 UNITS (TITRATE AS DIRECTED, MAX DAILY DOSE OF 100 UNITS) ONCE A DAY     ipratropium (ATROVENT) 0.03 % nasal spray USE 2 SPRAYS INTO EACH NOSTRIL  EVERY 12 (TWELVE) HOURS.     latanoprost (XALATAN) 0.005 % ophthalmic solution Place 1 drop into both eyes at bedtime.     levothyroxine (SYNTHROID, LEVOTHROID) 25 MCG tablet TAKE  1 TABLET BY MOUTH EVERY DAY 30 tablet 0   montelukast (SINGULAIR) 10 MG tablet Take 1 tablet (10 mg total) by mouth at bedtime. 30 tablet 11   naproxen sodium (ANAPROX) 550 MG tablet Take 550 mg by mouth 2 (two) times daily as needed.     NONFORMULARY OR COMPOUNDED ITEM Antifungal solution: Terbinafine 3%, Fluconazole 2%, Tea Tree Oil 5%, Urea 10%, Ibuprofen 2% in DMSO suspension #10m 1 each 3   OneTouch Delica Lancets 351WMISC For use when checking blood glucose     polyethylene glycol powder (GLYCOLAX/MIRALAX) 17 GM/SCOOP powder Take 17 grams po prn     QUICKVUE AT-HOME COVID-19 TEST KIT See admin instructions.     Spacer/Aero-Holding Chambers (AEROCHAMBER PLUS) inhaler use with inhalor     SYMBICORT 80-4.5 MCG/ACT inhaler TAKE 2 PUFFS BY MOUTH TWICE A DAY 10.2 each 2   triamterene-hydrochlorothiazide (MAXZIDE-25) 37.5-25 MG tablet Take 1 tablet by mouth every morning.     VASCEPA 1 [name] capsule Take 1 [name] by mouth 2 (two) times daily.     Molnupiravir 200 MG CAPS SMARTSIG:4 Each By Mouth Twice Daily     terbinafine (LAMISIL) 250 MG tablet TAKE 1 TABLET DAILY FOR 7 DAYS, REPEAT EVERY 4 WEEKS X 4 MONTHS 28 tablet 0   fluticasone (FLONASE) 50 MCG/ACT nasal spray Place into the nose.     amoxicillin-clavulanate (AUGMENTIN) 875-125 MG tablet Take 1 tablet by mouth 2 (two) times daily.     chlorhexidine (PERIDEX) 0.12 % solution SMARTSIG:0.5 By Mouth Twice Daily     fluconazole (DIFLUCAN) 150 MG tablet 1 tablet     flurbiprofen (ANSAID) 100 MG tablet Take 100 mg by mouth 2 (two) times daily.     prednisoLONE acetate (PRED FORTE) 1 % ophthalmic suspension Place 1 drop into the right eye 4 (four) times daily. (Patient not taking: Reported on 11/14/2020) 5 mL 0   pseudoephedrine (SUDAFED) 30 MG tablet Take by mouth.      Spacer/Aero-Holding Chambers (AEROCHAMBER PLUS) inhaler Use as instructed to use with inahaler. 1 each 1   TRULICITY 3 MCH/8.5IDSOPN Inject into the skin.     Turmeric 500 MG CAPS 1 capsule     XIGDUO XR 10-998 MG TB24 Take 1 tablet by mouth 2 (two) times daily.     No facility-administered medications prior to visit.    Review of Systems  Constitutional:  Negative for chills, fever, malaise/fatigue and weight loss.  HENT:  Negative for congestion, nosebleeds, sinus pain and sore throat.   Eyes:  Negative for photophobia, pain and discharge.  Respiratory:  Positive for cough and shortness of breath. Negative for hemoptysis, sputum production and wheezing.   Cardiovascular:  Negative for chest pain, palpitations, orthopnea and leg swelling.  Gastrointestinal:  Negative for abdominal pain, constipation, diarrhea, nausea and vomiting.  Genitourinary:  Negative for dysuria, frequency, hematuria and urgency.  Musculoskeletal:  Negative for back pain, joint pain, myalgias and neck pain.  Skin:  Negative for itching and rash.  Neurological:  Negative for tingling, tremors, sensory change, speech change, focal weakness, seizures, weakness and headaches.  Psychiatric/Behavioral:  Negative for memory loss, substance abuse and suicidal ideas. The patient is not nervous/anxious.       Objective:  Physical Exam   Vitals:   03/28/22 1442  BP: 136/64  Pulse: 91  SpO2: 98%  Weight: 212 lb (96.2 kg)  Height: _0  (1.626 m)    Gen: well appearing, no acute distress, frequent cough HENT: NCAT,  OP clear, neck supple without masses Eyes: PERRL, EOMi Lymph: no cervical lymphadenopathy PULM: CTA B CV: RRR, no mgr, no JVD GI: BS+, soft, nontender, no hsm Derm: ankle edema noted, no rash or skin breakdown MSK: normal bulk and tone Neuro: A&Ox4, MAEW Psyche: normal mood and affect   CBC    Component Value Date/Time   WBC 11.1 (H) 07/09/2019 1657   RBC 5.16 (H) 07/09/2019 1657   HGB 13.3  07/09/2019 1657   HCT 41.7 07/09/2019 1657   PLT 288.0 07/09/2019 1657   MCV 80.7 07/09/2019 1657   MCV 86.7 10/17/2011 1701   MCH 29.2 12/07/2014 1155   MCHC 31.9 07/09/2019 1657   RDW 16.3 (H) 07/09/2019 1657   LYMPHSABS 4.3 (H) 07/09/2019 1657   MONOABS 0.5 07/09/2019 1657   EOSABS 0.2 07/09/2019 1657   BASOSABS 0.1 07/09/2019 1657     Chest imaging: 2021 CT chest images independently reviewed showing normal pulmonary parenchyma with the exception of small round, well-circumscribed pulmonary nodules which radiology describes as stable in size over 3 years.  Large goiter noted  PFT: October 2023 spirometry ratio 82%, FEV1 1.16 L 44% predicted October 2023 exhaled nitric oxide 11 ppb  Labs:  Path:  Echo:  Heart Catheterization:       Assessment & Plan:   Chronic cough - Plan: Nitric oxide, Spirometry with graph, DG Chest 2 View  Cough variant asthma  Allergic rhinitis, unspecified seasonality, unspecified trigger  Discussion: Sarah has a past medical history significant for cough variant asthma which may be worse, however she describes symptoms of poorly controlled postnasal drip from allergic rhinitis.  In the past we have seen her her acid reflux has been a cause of her upper airway cough syndrome but that seems to be well controlled right now.  I will give her a stepwise approach for managing allergic rhinitis and then asthma if necessary to help control the cough.  If no improvement then she may need to have a laryngoscopy as she does have significant hoarseness.  She has multiple medications for allergic rhinitis at home which she is currently not taking.  Plan: Allergic rhinitis: Use Neil Med rinses with distilled water (or Ocean spray) at least twice per day using the instructions on the package. 1/2 hour after using the [facility] Med rinse, use Flonase (fluticasone) two puffs in each nostril once per day.  Remember that the flonase can take 1-2 weeks to work  after regular use. Use generic zyrtec (cetirizine) every day.  If this doesn't help, then stop taking it and use chlorpheniramine-phenylephrine combination tablets.  Cough: Chest x-ray today You need to try to suppress your cough to allow your larynx (voice box) to heal.  For three days don't talk, laugh, sing, or clear your throat. Do everything you can to suppress the cough during this time. Use hard candies (sugarless Jolly Ranchers) or non-mint or non-menthol containing cough drops during this time to soothe your throat.  Use a cough suppressant (Delsym or what I have prescribed you) around the clock during this time.  After three days, gradually increase the use of your voice and back off on the cough suppressants.   Asthma: I do not think this is contributing to your cough but we need to evaluate for that Exhaled nitric oxide test today Spirometry test today If no improvement in your cough after 2 weeks of the above-mentioned therapy start taking the Symbicort you have at home 2 puffs twice a day no matter  how you feel with a spacer. If no improvement after taking this for a week then go ahead and start taking the Singulair you have at home.  We will see you back in 4 weeks or sooner if needed   Immunizations: Immunization History  Administered Date(s) Administered   Hepatitis B, adult 12/06/2016, 01/15/2017   PFIZER(Purple Top)SARS-COV-2 Vaccination 08/08/2019, 08/29/2019     Current Outpatient Medications:    acetaminophen (TYLENOL) 325 MG tablet, 1 tablet as needed, Disp: , Rfl:    albuterol (VENTOLIN HFA) 108 (90 Base) MCG/ACT inhaler, Inhale 2 puffs into the lungs every 6 (six) hours as needed for wheezing or shortness of breath., Disp: 18 [name], Rfl: 1   atorvastatin (LIPITOR) 10 MG tablet, Take 1 tablet by mouth at bedtime., Disp: , Rfl:    BD PEN NEEDLE NANO 2ND GEN 32G X 4 MM MISC, USE TO ADMINSTER INSULIN ONCE A DAY SUBCUTANEOUS AS DIRECTED, Disp: , Rfl:    brimonidine  (ALPHAGAN) 0.2 % ophthalmic solution, INSTILL 1 DROP IN BOTH EYES FOUR TIMES A DAY, Disp: , Rfl:    cholecalciferol (VITAMIN D) 1000 units tablet, Take 1,000 Units by mouth daily., Disp: , Rfl:    cholecalciferol (VITAMIN D) 25 MCG (1000 UNIT) tablet, 1 tablet, Disp: , Rfl:    cyclobenzaprine (FLEXERIL) 10 MG tablet, Take 1 tablet by mouth 3 (three) times daily as needed., Disp: , Rfl:    Dapagliflozin-metFORMIN HCl ER (XIGDUO XR) 10-998 MG TB24, 2 tablets, Disp: , Rfl:    Dulaglutide (TRULICITY) 4.5 LN/9.8XQ SOPN, Inject 4.5 mg into the skin once a week., Disp: , Rfl:    fexofenadine (ALLEGRA ALLERGY) 180 MG tablet, 1 tablet as needed, Disp: , Rfl:    fexofenadine (ALLEGRA) 180 MG tablet, Take 180 mg by mouth daily., Disp: , Rfl:    glucose blood test strip, For use when checking blood glucose, Disp: , Rfl:    ibuprofen (ADVIL) 200 MG tablet, 1 tablet with food or milk as needed, Disp: , Rfl:    Icosapent Ethyl (VASCEPA PO), 2 capsules, Disp: , Rfl:    Insulin Glargine (BASAGLAR KWIKPEN) 100 UNIT/ML SOPN, Inject 10 Units into the skin daily. INJECT 100 UNITS (TITRATE AS DIRECTED, MAX DAILY DOSE OF 100 UNITS) ONCE A DAY, Disp: , Rfl:    ipratropium (ATROVENT) 0.03 % nasal spray, USE 2 SPRAYS INTO EACH NOSTRIL EVERY 12 (TWELVE) HOURS., Disp: , Rfl:    latanoprost (XALATAN) 0.005 % ophthalmic solution, Place 1 drop into both eyes at bedtime., Disp: , Rfl:    levothyroxine (SYNTHROID, LEVOTHROID) 25 MCG tablet, TAKE 1 TABLET BY MOUTH EVERY DAY, Disp: 30 tablet, Rfl: 0   montelukast (SINGULAIR) 10 MG tablet, Take 1 tablet (10 mg total) by mouth at bedtime., Disp: 30 tablet, Rfl: 11   naproxen sodium (ANAPROX) 550 MG tablet, Take 550 mg by mouth 2 (two) times daily as needed., Disp: , Rfl:    NONFORMULARY OR COMPOUNDED ITEM, Antifungal solution: Terbinafine 3%, Fluconazole 2%, Tea Tree Oil 5%, Urea 10%, Ibuprofen 2% in DMSO suspension #59m, Disp: 1 each, Rfl: 3   OneTouch Delica Lancets 311HMISC, For  use when checking blood glucose, Disp: , Rfl:    polyethylene glycol powder (GLYCOLAX/MIRALAX) 17 GM/SCOOP powder, Take 17 grams po prn, Disp: , Rfl:    QUICKVUE AT-HOME COVID-19 TEST KIT, See admin instructions., Disp: , Rfl:    Spacer/Aero-Holding Chambers (AEROCHAMBER PLUS) inhaler, use with inhalor, Disp: , Rfl:    SYMBICORT 80-4.5 MCG/ACT inhaler,  TAKE 2 PUFFS BY MOUTH TWICE A DAY, Disp: 10.2 each, Rfl: 2   triamterene-hydrochlorothiazide (MAXZIDE-25) 37.5-25 MG tablet, Take 1 tablet by mouth every morning., Disp: , Rfl:    VASCEPA 1 [name] capsule, Take 1 [name] by mouth 2 (two) times daily., Disp: , Rfl:    fluticasone (FLONASE) 50 MCG/ACT nasal spray, Place into the nose., Disp: , Rfl:

## 2022-03-28 NOTE — Patient Instructions (Signed)
Allergic rhinitis: Use Milta Deiters Med rinses with distilled water (or Ocean spray) at least twice per day using the instructions on the package. 1/2 hour after using the [facility] Med rinse, use Flonase (fluticasone) two puffs in each nostril once per day.  Remember that the flonase can take 1-2 weeks to work after regular use. Use generic zyrtec (cetirizine) every day.  If this doesn't help, then stop taking it and use chlorpheniramine-phenylephrine combination tablets.  Cough: Chest x-ray today You need to try to suppress your cough to allow your larynx (voice box) to heal.  For three days don't talk, laugh, sing, or clear your throat. Do everything you can to suppress the cough during this time. Use hard candies (sugarless Jolly Ranchers) or non-mint or non-menthol containing cough drops during this time to soothe your throat.  Use a cough suppressant (Delsym or what I have prescribed you) around the clock during this time.  After three days, gradually increase the use of your voice and back off on the cough suppressants.   Asthma: I do not think this is contributing to your cough but we need to evaluate for that Exhaled nitric oxide test today Spirometry test today If no improvement in your cough after 2 weeks of the above-mentioned therapy start taking the Symbicort you have at home 2 puffs twice a day no matter how you feel with a spacer. If no improvement after taking this for a week then go ahead and start taking the Singulair you have at home.  We will see you back in 4 weeks or sooner if needed

## 2022-04-19 ENCOUNTER — Encounter: Payer: Self-pay | Admitting: Pulmonary Disease

## 2022-04-19 ENCOUNTER — Ambulatory Visit (INDEPENDENT_AMBULATORY_CARE_PROVIDER_SITE_OTHER): Payer: BC Managed Care – PPO | Admitting: Pulmonary Disease

## 2022-04-19 VITALS — BP 130/72 | HR 90 | Ht 64.0 in | Wt 212.0 lb

## 2022-04-19 DIAGNOSIS — R053 Chronic cough: Secondary | ICD-10-CM

## 2022-04-19 DIAGNOSIS — J309 Allergic rhinitis, unspecified: Secondary | ICD-10-CM | POA: Diagnosis not present

## 2022-04-19 NOTE — Progress Notes (Signed)
Synopsis: First seen by [facility] in 2017 for cough variant asthma and pulmonary nodules.  Briefly smoked 5 cigarettes a day for 2 years as a young adult.  Subjective:   PATIENT ID: Erika [name] GENDER: female DOB: 06-Aug-1965, MRN: [id number]   HPI  Chief Complaint  Patient presents with   Follow-up    4wk f/u. States she has the cough. Only relief has been from the sinus rinse. Wants to discuss Singulair.    Her cough hasn't improved ata all She tried resting her voice for three days She says that the butterscotch helps a little  For allergic rhinitis she is taking ocean spray > she stopped allegra > she hasn't taken the zyrtec > she is not taking flonase or Hasn't tried zyrtec  Past Medical History:  Diagnosis Date   Arthritis    hands, neck   Asthma    no inhaler, no problems x 5 years.   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  HENT:  Positive for congestion. Negative for sinus pain and sore throat.   Respiratory:  Positive for cough. Negative for sputum production and shortness of breath.   Cardiovascular:  Negative for chest pain and leg swelling.      Objective:  Physical Exam   Vitals:   04/19/22 1546  BP: 130/72  Pulse: 90  SpO2: 97%  Weight: 212 lb (96.2 kg)  Height: _0   (1.626 m)    Gen: well appearing HENT: OP clear, neck supple PULM: CTA B, normal percussion CV: RRR, no mgr, trace edema GI: BS+, soft, nontender Derm: no cyanosis or rash Psyche: normal mood and affect    CBC    Component Value Date/Time   WBC 11.1 (H) 07/09/2019 1657   RBC 5.16 (H) 07/09/2019 1657   HGB 13.3 07/09/2019 1657   HCT 41.7 07/09/2019 1657   PLT 288.0 07/09/2019 1657   MCV 80.7 07/09/2019 1657   MCV 86.7 10/17/2011 1701   MCH 29.2 12/07/2014 1155   MCHC 31.9 07/09/2019 1657   RDW 16.3 (H) 07/09/2019 1657   LYMPHSABS 4.3 (H) 07/09/2019 1657   MONOABS 0.5 07/09/2019 1657   EOSABS 0.2 07/09/2019 1657   BASOSABS 0.1 07/09/2019 1657     Chest imaging: 2021 CT chest images independently reviewed showing normal pulmonary parenchyma with the exception of small round, well-circumscribed pulmonary nodules which radiology describes as stable in size over 3 years.  Large goiter noted  October 2023 chest x-ray normal  PFT: October 2023 spirometry ratio 82%, FEV1 1.16 L 44% predicted  October 2023 exhaled nitric oxide 11 ppb  Labs:  Path:  Echo:  Heart Catheterization:       Assessment & [name]:   Allergic rhinitis, unspecified seasonality, unspecified trigger - [name]: Ambulatory referral to Allergy  Chronic cough  Discussion: Erika [name] has persistent chronic cough due to untreated allergic rhinitis.  She has not tried the allergic rhinitis treatment that we recommended over concerns for side effects.  We talked about this extensively today.  We also discussed the option of allergy testing and potentially immunotherapy.  She seems interested in this.  She also has a deviated septum which has not been treated and is likely contributing to the severity of her sinus disease  I see no convincing evidence of a pulmonary cause for her cough.  [name]: Chronic cough with allergic rhinitis: Consider taking the Flonase and Zyrtec we recommended consider taking the  Flonase and Zyrtec we recommended on the last visit I will refer you to allergy for further assessment of the cause of your allergic rhinitis and postnasal drip If no improvement in cough and hoarseness after allergy evaluation I would recommend seeing ear nose and throat again.  Mild intermittent asthma: Albuterol as needed  Pulmonary nodules: No further imaging needed, stable on imaging in 2021, repeat chest x-ray without evidence of recurrence or worsening  Let me know if you have worsening respiratory problem and I be happy to see you back in this clinic.  Immunizations: Immunization History  Administered Date(s) Administered   Hepatitis B, adult 12/06/2016, 01/15/2017   PFIZER(Purple Top)SARS-COV-2 Vaccination 08/08/2019, 08/29/2019     Current Outpatient Medications:    acetaminophen (TYLENOL) 325 MG tablet, 1 tablet as needed, Disp: , Rfl:    albuterol (VENTOLIN HFA) 108 (90 Base) MCG/ACT inhaler, Inhale 2 puffs into the lungs every 6 (six) hours as needed for wheezing or shortness of breath., Disp: 18 g, Rfl: 1   atorvastatin (LIPITOR) 10 MG tablet, Take 1 tablet by mouth at bedtime., Disp: , Rfl:    BD PEN NEEDLE NANO 2ND GEN 32G X 4 MM MISC, USE TO ADMINSTER INSULIN ONCE A DAY SUBCUTANEOUS AS DIRECTED, Disp: , Rfl:    brimonidine (ALPHAGAN) 0.2 % ophthalmic solution, INSTILL 1 DROP IN BOTH EYES FOUR TIMES A DAY, Disp: , Rfl:    cholecalciferol (VITAMIN D) 25 MCG (1000 UNIT) tablet, 1 tablet, Disp: , Rfl:    cyclobenzaprine (FLEXERIL) 10 MG tablet, Take 1 tablet by mouth 3 (three) times daily as needed., Disp: , Rfl:    Dapagliflozin-metFORMIN HCl ER (XIGDUO XR) 10-998 MG TB24, 2 tablets, Disp: , Rfl:    Dulaglutide (TRULICITY) 4.5 KG/8.1EH SOPN, Inject 4.5 mg into the skin once a week., Disp: , Rfl:    glucose blood test strip, For use when checking blood glucose, Disp: , Rfl:    ibuprofen (ADVIL) 200 MG tablet, 1 tablet with food or milk as needed, Disp: , Rfl:    Icosapent  Ethyl (VASCEPA PO), 2 capsules, Disp: , Rfl:    Insulin Glargine (BASAGLAR KWIKPEN) 100 UNIT/ML SOPN, Inject 10 Units into the skin daily. INJECT 110 UNITS (TITRATE AS DIRECTED, MAX DAILY DOSE OF 100 UNITS) ONCE A DAY, Disp: , Rfl:    ipratropium (ATROVENT) 0.03 % nasal spray, USE 2 SPRAYS INTO EACH NOSTRIL EVERY 12 (TWELVE) HOURS., Disp: , Rfl:    latanoprost (XALATAN) 0.005 % ophthalmic solution, Place 1 drop into both eyes at bedtime., Disp: , Rfl:    levothyroxine (SYNTHROID, LEVOTHROID) 25 MCG tablet, TAKE 1 TABLET BY MOUTH EVERY DAY, Disp: 30 tablet, Rfl: 0   NONFORMULARY  OR COMPOUNDED ITEM, Antifungal solution: Terbinafine 3%, Fluconazole 2%, Tea Tree Oil 5%, Urea 10%, Ibuprofen 2% in DMSO suspension #18m, Disp: 1 each, Rfl: 3   OneTouch Delica Lancets 386HMISC, For use when checking blood glucose, Disp: , Rfl:    QUICKVUE AT-HOME COVID-19 TEST KIT, See admin instructions., Disp: , Rfl:    Spacer/Aero-Holding Chambers (AEROCHAMBER PLUS) inhaler, use with inhalor, Disp: , Rfl:    SYMBICORT 80-4.5 MCG/ACT inhaler, TAKE 2 PUFFS BY MOUTH TWICE A DAY, Disp: 10.2 each, Rfl: 2   triamterene-hydrochlorothiazide (MAXZIDE-25) 37.5-25 MG tablet, Take 1 tablet by mouth every morning., Disp: , Rfl:    VASCEPA 1 g capsule, Take 1 g by mouth 2 (two) times daily., Disp: , Rfl:    fluticasone (FLONASE) 50 MCG/ACT nasal spray, Place into the nose., Disp: , Rfl:  Greater than 50% of this 35-minute visit spent face-to-face

## 2022-04-19 NOTE — Patient Instructions (Signed)
Chronic cough with allergic rhinitis: Consider taking the Flonase and Zyrtec we recommended consider taking the Flonase and Zyrtec we recommended on the last visit I will refer you to allergy for further assessment of the cause of your allergic rhinitis and postnasal drip If no improvement in cough and hoarseness after allergy evaluation I would recommend seeing ear nose and throat again.  Mild intermittent asthma: Albuterol as needed  Let me know if you have worsening respiratory problem and I be happy to see you back

## 2022-04-30 ENCOUNTER — Encounter: Payer: Self-pay | Admitting: Podiatry

## 2022-04-30 ENCOUNTER — Ambulatory Visit (INDEPENDENT_AMBULATORY_CARE_PROVIDER_SITE_OTHER): Payer: BC Managed Care – PPO | Admitting: Podiatry

## 2022-04-30 DIAGNOSIS — M79675 Pain in left toe(s): Secondary | ICD-10-CM | POA: Diagnosis not present

## 2022-04-30 DIAGNOSIS — E1140 Type 2 diabetes mellitus with diabetic neuropathy, unspecified: Secondary | ICD-10-CM

## 2022-04-30 DIAGNOSIS — E1149 Type 2 diabetes mellitus with other diabetic neurological complication: Secondary | ICD-10-CM | POA: Diagnosis not present

## 2022-04-30 DIAGNOSIS — M79674 Pain in right toe(s): Secondary | ICD-10-CM

## 2022-04-30 DIAGNOSIS — B351 Tinea unguium: Secondary | ICD-10-CM | POA: Diagnosis not present

## 2022-05-05 NOTE — Progress Notes (Signed)
  Subjective:  Patient ID: Erika [name], female    DOB: September 02, 1965,  MRN: [id number]  Erika [name] presents to clinic today for at risk foot care with history of diabetic neuropathy and painful thick toenails that are difficult to trim. Pain interferes with ambulation. Aggravating factors include wearing enclosed shoe gear. Pain is relieved with periodic professional debridement.  Chief Complaint  Patient presents with   Nail Problem    Diabetic foot care BS- A1C- PCP-Erika [name], Erika [name] PCP [facility]-   Patient states left great toenail is tender. Denies any redness, swelling or drainage.   PCP is Erika [name], Erika [name] [name], Erika [name].  Allergies  Allergen Reactions   Codeine Other (See Comments)    dizzy Other reaction(s): too strong/makes dizzy   Latex Hives   Other Other (See Comments)    Allergy to msg   Reaction- headache Allergy to saccharin-  Reaction- headache Other reaction(s): rash   Benadryl [Diphenhydramine] Rash   Nitrofurantoin Monohyd Macro Rash   Sulfa Antibiotics Rash    Review of Systems: Negative except as noted in the HPI.  Objective: No changes noted in today's physical examination.  TALIA HOHEISEL is a pleasant 56 y.o. female in NAD. AAO x 3.  Vascular CFT immediate b/l LE. Palpable DP/PT pulses b/l LE. Digital hair sparse b/l. Skin temperature gradient WNL b/l. No pain with calf compression b/l. No edema noted b/l. No cyanosis or clubbing noted b/l LE.  Neurologic Normal speech. Oriented to person, place, and time. Pt has subjective symptoms of neuropathy. Protective sensation diminished with 10g monofilament b/l. Vibratory sensation intact b/l.  Dermatologic Pedal integument with normal turgor, texture and tone b/l LE. No open wounds b/l. No interdigital macerations b/l.   Toenails bilateral great toes and bilateral 5th toes elongated, thickened, discolored with subungual debris. +Tenderness with dorsal palpation of nailplates.   Incurvated nailplate left great toe lateral  border(s) with tenderness to palpation. No erythema, no edema, no drainage noted.   No hyperkeratotic or porokeratotic lesions present.  Orthopedic: Muscle strength 5/5 to all lower extremity muscle groups bilaterally. No pain, crepitus or joint limitation noted with ROM bilateral LE. No gross bony deformities bilaterally.   Radiographs: None Assessment/Plan: 1. Pain due to onychomycosis of toenails of both feet   2. Diabetic neuropathy with neurologic complication (HCC)     No orders of the defined types were placed in this encounter.   -Consent given for treatment as described below: -Patient to continue soft, supportive shoe gear daily. -Toenails 2-5 bilaterally and R hallux debrided in length and girth without iatrogenic bleeding with sterile nail nipper and dremel. Continue Formula 7 to toenails once daily. -No invasive procedure(s) performed. Offending nail border debrided and curretaged lateral border of L hallux utilizing sterile nail nipper and currette. Border(s) cleansed with alcohol and triple antibiotic ointment applied. Patient/POA/Caregiver/Facility instructed to apply Neosporin Cream  to L hallux once daily for 7 days. Call office if there are any concerns. -Patient/POA to call should there be question/concern in the interim.   Return in about 3 months (around 07/31/2022).  Marzetta Board, DPM

## 2022-05-18 LAB — LAB REPORT - SCANNED
Creatinine, Random Urine: 63
EGFR (Non-African Amer.): 100
Microalb Creat Ratio: 15.6
Microalb, Ur: 0.99

## 2022-05-31 ENCOUNTER — Ambulatory Visit: Payer: BC Managed Care – PPO | Admitting: Allergy

## 2022-07-04 ENCOUNTER — Ambulatory Visit: Payer: BC Managed Care – PPO | Admitting: Allergy

## 2022-07-04 ENCOUNTER — Encounter: Payer: Self-pay | Admitting: Allergy

## 2022-07-04 ENCOUNTER — Other Ambulatory Visit: Payer: Self-pay

## 2022-07-04 VITALS — BP 132/78 | HR 102 | Temp 98.3°F | Resp 17 | Ht 64.0 in | Wt 219.1 lb

## 2022-07-04 DIAGNOSIS — J45991 Cough variant asthma: Secondary | ICD-10-CM | POA: Diagnosis not present

## 2022-07-04 DIAGNOSIS — H1013 Acute atopic conjunctivitis, bilateral: Secondary | ICD-10-CM

## 2022-07-04 DIAGNOSIS — R918 Other nonspecific abnormal finding of lung field: Secondary | ICD-10-CM | POA: Diagnosis not present

## 2022-07-04 DIAGNOSIS — J3089 Other allergic rhinitis: Secondary | ICD-10-CM

## 2022-07-04 MED ORDER — RYALTRIS 665-25 MCG/ACT NA SUSP: 2.0000 | Freq: Two times a day (BID) | NASAL | 5 refills | Status: DC

## 2022-07-04 NOTE — Patient Instructions (Signed)
-   Testing today showed: indoor molds, outdoor molds, dust mites, and dog. - Copy of test results provided.  - Avoidance measures provided. - Stop taking: Allegra for now - Start taking: Xyzal (levocetirizine) '5mg'$  tablet once daily.  This replaces Allegra if effective.  Ryaltris (olopatadine/mometasone) two sprays per nostril 2 times daily at this time.  This is a combination nasal spray that helps with both nasal drainage (olopatadine) and nasal congestion (mometasone).  - You can use an extra dose of the antihistamine, if needed, for breakthrough symptoms.  - Continue nasal saline rinses 2 times daily to remove allergens from the nasal cavities as well as help with mucous clearance (this is especially helpful to do before the nasal sprays are given) - Consider allergy shots as a means of long-term control if medication management is not effective enough. - Allergy shots "re-train" and "reset" the immune system to ignore environmental allergens and decrease the resulting immune response to those allergens (sneezing, itchy watery eyes, runny nose, nasal congestion, etc).    - Allergy shots improve symptoms in 80-85% of patients.   - Continue recommendations as per Dr Lake Bells with your asthma history and lung nodules  Follow-up in 3-4 months or sooner if needed

## 2022-07-04 NOTE — Progress Notes (Signed)
New Patient Note  RE: Erika [name] MRN: [id number] DOB: May 08, 1966 Date of Office Visit: 07/04/2022  Primary care provider: Martinique, Betty G, MD  Chief Complaint: cough, allergies  History of present illness: Erika [name] is a 57 y.o. female presenting today for evaluation of chronic cough, allergic rhinitis.   She has had a chronic cough that is being attributed to untreated allergic rhinitis.  She states the cough seem to get worse after Covid illness in 2021.  She states her sense of smell even has not returned to her baseline.  The lingering mucus does not want to go away and it tickles her throat and contributes to cough.  Ice helps.  She has a goiter and does follow with endocrinologist for this and her diabetes management.  She also has a deviated septum for over 40 years.  She does perform nasal saline rinses and does get relief.  She does this twice a day.  She does ocean spray nasal spray.  She takes Human resources officer daily for years.  She tried to change to a different antihistamines and took carbinoxamine (she believes-- states started with a C and was '4mg'$ ) for a period and it made her sleepy but not sure if helped with her cough.   She does report itchy eyes as well.  She has remedied the home of mold and her cat passed last year.   She has used albuterol but doesn't help with these symptoms.   She has used symbicort in the past.   She states the tessalon perls are helpful.   She follows with Dr Lake Bells in pulmonology for cough variant asthma and lung nodules.  She has had routine imaging to follow the nodules with most recent CT from 2021 with stable nodules.   CXR 10/23 was normal.   PFTs from 10/23 with normal ration at 82% but FEV1 44% predicted.     Review of systems: Review of Systems  Constitutional: Negative.   HENT: Negative.         See HPI  Eyes: Negative.        See HPI  Respiratory: Negative.         See HPI  Cardiovascular: Negative.   Gastrointestinal:  Negative.   Musculoskeletal: Negative.   Skin: Negative.   Allergic/Immunologic: Negative.   Neurological: Negative.     All other systems negative unless noted above in HPI  Past medical history: Past Medical History:  Diagnosis Date   Arthritis    hands, neck   Asthma    no inhaler, no problems x 5 years.   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   Radiation and surgery   Fibroid    Goiter    Heart murmur    never had any problems   Hyperlipidemia    Hypertension    Hypothyroid    Infertility, female    Neuromuscular disorder (HCC)    neuropathy in feet and finger tips   Neuropathy    located fingers and toes   Ocular hypertension    PCOS (polycystic ovarian syndrome)    Seasonal allergies    Umbilical hernia    Urinary incontinence     Past surgical history: Past Surgical History:  Procedure Laterality Date   BACK SURGERY  12/1997,01/12/1998,01/20/1998  x 3 surgeries - L4/L5/L6   BREAST BIOPSY Right 12/14/2004   COLPOSCOPY     DILATATION & CURETTAGE/HYSTEROSCOPY WITH MYOSURE N/A 12/14/2014   Procedure: DILATATION & CURETTAGE/HYSTEROSCOPY WITH MYOSURE;  Surgeon: [name], MD;  Location: [facility];  Service: Gynecology;  Laterality: N/A;  to follow 2nd case 10:00?  needs one hour OR time   [facility] UTERUS  2010   endometrial polyp   HYSTEROSCOPY  2004   MYOMECTOMY  2004   OVARY SURGERY  09/1998   bilateral ovarian transposition due to radiation treatment for spine lesion   WISDOM TOOTH EXTRACTION      Family history:  Family History  Problem Relation Age of Onset   Lung cancer Mother    Hypertension Maternal Aunt    Breast cancer Maternal Aunt        Age 4   Thyroid disease Maternal Aunt    Colon cancer Maternal Aunt    Lung cancer Maternal Uncle    Esophageal cancer  Maternal Uncle    Cirrhosis Maternal Aunt    Colon cancer Maternal Aunt    Throat cancer Brother     Social history: Lives in a home with carpeting with electric heating and central cooling.  No pets in the home.  Maybe concern for water damage, mildew in the home.  No concern for roaches in the home.  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  Denies smoking history.    Medication List: Current Outpatient Medications  Medication Sig Dispense Refill   acetaminophen (TYLENOL) 325 MG tablet 1 tablet as needed     albuterol (VENTOLIN HFA) 108 (90 Base) MCG/ACT inhaler Inhale 2 puffs into the lungs every 6 (six) hours as needed for wheezing or shortness of breath. 18 g 1   atorvastatin (LIPITOR) 10 MG tablet Take 1 tablet by mouth at bedtime.     BD PEN NEEDLE NANO 2ND GEN 32G X 4 MM MISC USE TO ADMINSTER INSULIN ONCE A DAY SUBCUTANEOUS AS DIRECTED     benzonatate (TESSALON) 100 MG capsule Take 100 mg by mouth 3 (three) times daily as needed for cough.     brimonidine (ALPHAGAN) 0.2 % ophthalmic solution INSTILL 1 DROP IN BOTH EYES FOUR TIMES A DAY     cholecalciferol (VITAMIN D) 25 MCG (1000 UNIT) tablet 1 tablet     Dapagliflozin-metFORMIN HCl ER (XIGDUO XR) 10-998 MG TB24 2 tablets     Dulaglutide (TRULICITY) 4.5 ZO/1.0RU SOPN Inject 4.5 mg into the skin once a week.     fexofenadine (ALLEGRA) 180 MG tablet Take 180 mg by mouth daily.     glucose blood test strip For use when checking blood glucose     ibuprofen (ADVIL) 200 MG tablet 1 tablet with food or milk as needed     Insulin Glargine (BASAGLAR KWIKPEN) 100 UNIT/ML SOPN Inject 10 Units into the skin daily. INJECT 110 UNITS (TITRATE AS DIRECTED, MAX DAILY DOSE OF 100 UNITS) ONCE A DAY     latanoprost (XALATAN) 0.005 % ophthalmic solution Place 1 drop into both eyes at bedtime.     levothyroxine (SYNTHROID, LEVOTHROID) 25 MCG tablet TAKE 1 TABLET BY MOUTH EVERY DAY 30 tablet 0   NONFORMULARY OR COMPOUNDED ITEM Antifungal solution:  Terbinafine 3%, Fluconazole 2%, Tea Tree Oil 5%, Urea 10%, Ibuprofen 2% in DMSO suspension #62m 1 each 3   OneTouch Delica Lancets 304VMISC For use when checking blood glucose     QUICKVUE AT-HOME  COVID-19 TEST KIT See admin instructions.     RYALTRIS [id number] MCG/ACT SUSP Place 2 sprays into the nose in the morning and at bedtime. 29 g 5   Spacer/Aero-Holding Chambers (AEROCHAMBER PLUS) inhaler use with inhalor     triamterene-hydrochlorothiazide (MAXZIDE-25) 37.5-25 MG tablet Take 1 tablet by mouth every morning.     cyclobenzaprine (FLEXERIL) 10 MG tablet Take 1 tablet by mouth 3 (three) times daily as needed. (Patient not taking: Reported on 07/04/2022)     fluticasone (FLONASE) 50 MCG/ACT nasal spray Place into the nose.     Icosapent Ethyl (VASCEPA PO) 2 capsules (Patient not taking: Reported on 07/04/2022)     ipratropium (ATROVENT) 0.03 % nasal spray USE 2 SPRAYS INTO EACH NOSTRIL EVERY 12 (TWELVE) HOURS. (Patient not taking: Reported on 07/04/2022)     losartan-hydrochlorothiazide (HYZAAR) 50-12.5 MG tablet Take 1 tablet by mouth daily. (Patient not taking: Reported on 07/04/2022)     SYMBICORT 80-4.5 MCG/ACT inhaler TAKE 2 PUFFS BY MOUTH TWICE A DAY (Patient not taking: Reported on 07/04/2022) 10.2 each 2   VASCEPA 1 g capsule Take 1 g by mouth 2 (two) times daily. (Patient not taking: Reported on 07/04/2022)     No current facility-administered medications for this visit.    Known medication allergies: Allergies  Allergen Reactions   Codeine Other (See Comments)    dizzy  Other reaction(s): too strong/makes dizzy  dizzy  dizzy    dizzy  Other reaction(s): too strong/makes dizzy   Latex Hives   Other Other (See Comments)    Allergy to msg   Reaction- headache Allergy to saccharin-  Reaction- headache Other reaction(s): rash   Benadryl [Diphenhydramine] Rash   Nitrofurantoin Monohyd Macro Rash   Sulfa Antibiotics Rash     Physical examination: Blood pressure 132/78, pulse  (!) 102, temperature 98.3 F (36.8 C), resp. rate 17, height '5\' 4"'$  (1.626 m), weight 219 lb 1.6 oz (99.4 kg), last menstrual period 08/20/2014, SpO2 95 %.  General: Alert, interactive, in no acute distress. HEENT: PERRLA, TMs pearly gray, turbinates minimally edematous without discharge, post-pharynx non erythematous with + cobblestoning. Neck: Supple without lymphadenopathy. Lungs: Clear to auscultation without wheezing, rhonchi or rales. {no increased work of breathing. CV: Normal S1, S2 without murmurs. Abdomen: Nondistended, nontender. Skin: Warm and dry, without lesions or rashes. Extremities:  No clubbing, cyanosis or edema. Neuro:   Grossly intact.  Diagnositics/Labs:  IgE total from 2021-206  Spirometry: FEV1: 0.92L 35%, FVC: 1.16l 34% predicted  Allergy testing:   Airborne Adult Perc - 07/04/22 1543     Time Antigen Placed 1543    Allergen Manufacturer Lavella Hammock    Location Back    Number of Test 58    1. Control-Buffer 50% Glycerol Negative    2. Control-Histamine 1 mg/ml 2+    3. Albumin saline Negative    4. Sharon Negative    5. [place] Negative    6. Johnson Negative    7. Amelia Blue Negative    9. Perennial Rye Negative    10. Sweet Vernal Negative    11. Timothy Negative    12. Cocklebur Negative    13. Burweed Marshelder Negative    14. Ragweed, short Negative    15. Ragweed, Giant Negative    16. Plantain,  English Negative    17. Lamb's Quarters Negative    18. Sheep Sorrell Negative    19. Rough Pigweed Negative    20. Marsh Elder, Rough Negative    21.  Mugwort, Common Negative    22. Ash mix Negative    23. Birch mix Negative    24. Beech American Negative    25. Box, Elder Negative    26. Cedar, red Negative    27. Cottonwood, [place] Negative    28. Elm mix Negative    29. Hickory Negative    30. Maple mix Negative    31. Oak, [place] mix Negative    32. Pecan Pollen Negative    33. Pine mix Negative    34. Sycamore Eastern Negative     35. [place], Black Pollen Negative    36. Alternaria alternata Negative    37. Cladosporium Herbarum Negative    38. Aspergillus mix Negative    39. Penicillium mix Negative    40. Bipolaris sorokiniana (Helminthosporium) 2+    41. Drechslera spicifera (Curvularia) Negative    42. Mucor plumbeus Negative    43. Fusarium moniliforme Negative    44. Aureobasidium pullulans (pullulara) Negative    45. Rhizopus oryzae Negative    46. Botrytis cinera Negative    47. Epicoccum nigrum Negative    48. Phoma betae Negative    49. Candida Albicans Negative    50. Trichophyton mentagrophytes 2+    51. Mite, D Farinae  5,000 AU/ml 2+    52. Mite, D Pteronyssinus  5,000 AU/ml Negative    53. Cat Hair 10,000 BAU/ml Negative    54.  Dog Epithelia Negative    55. Mixed Feathers Negative    56. Horse Epithelia Negative    57. Cockroach, German Negative    58. Mouse Negative    59. Tobacco Leaf Negative             Intradermal - 07/04/22 1628     Time Antigen Placed 1628    Allergen Manufacturer Other    Location Arm    Number of Test 13    Control Negative    Guatemala Negative    Johnson Negative    7 Grass Negative    Ragweed mix Negative    Weed mix Negative    Tree mix Negative    Mold 1 Negative    Mold 2 2+    Mold 4 2+    Cat Negative    Dog 2+    Cockroach Negative             Allergy testing results were read and interpreted by provider, documented by clinical staff.   Assessment and plan: Allergic rhinitis with conjunctivitis   - Testing today showed: indoor molds, outdoor molds, dust mites, and dog. - Copy of test results provided.  - Avoidance measures provided. - Stop taking: Allegra for now - Start taking: Xyzal (levocetirizine) '5mg'$  tablet once daily.  This replaces Allegra if effective.  Ryaltris (olopatadine/mometasone) two sprays per nostril 2 times daily at this time.  This is a combination nasal spray that helps with both nasal drainage (olopatadine)  and nasal congestion (mometasone).  - You can use an extra dose of the antihistamine, if needed, for breakthrough symptoms.  - Continue nasal saline rinses 2 times daily to remove allergens from the nasal cavities as well as help with mucous clearance (this is especially helpful to do before the nasal sprays are given) - Consider allergy shots as a means of long-term control if medication management is not effective enough. - Allergy shots "re-train" and "reset" the immune system to ignore environmental allergens and decrease the resulting immune response to those allergens (  sneezing, itchy watery eyes, runny nose, nasal congestion, etc).    - Allergy shots improve symptoms in 80-85% of patients.   Cough variant asthma Lung nodules - Continue recommendations as per Dr Lake Bells with your asthma history and lung nodules  Follow-up in 3-4 months or sooner if needed  I appreciate the opportunity to take part in Kiondra's care. Please do not hesitate to contact me with questions.  Sincerely,   Prudy Feeler, MD Allergy/Immunology [facility]

## 2022-07-09 ENCOUNTER — Encounter (INDEPENDENT_AMBULATORY_CARE_PROVIDER_SITE_OTHER): Payer: BC Managed Care – PPO | Admitting: Ophthalmology

## 2022-08-20 ENCOUNTER — Ambulatory Visit (INDEPENDENT_AMBULATORY_CARE_PROVIDER_SITE_OTHER): Payer: BC Managed Care – PPO | Admitting: Podiatry

## 2022-08-20 ENCOUNTER — Encounter: Payer: Self-pay | Admitting: Podiatry

## 2022-08-20 DIAGNOSIS — M79675 Pain in left toe(s): Secondary | ICD-10-CM

## 2022-08-20 DIAGNOSIS — B351 Tinea unguium: Secondary | ICD-10-CM

## 2022-08-20 DIAGNOSIS — E1140 Type 2 diabetes mellitus with diabetic neuropathy, unspecified: Secondary | ICD-10-CM | POA: Diagnosis not present

## 2022-08-20 DIAGNOSIS — E1149 Type 2 diabetes mellitus with other diabetic neurological complication: Secondary | ICD-10-CM

## 2022-08-20 DIAGNOSIS — M79674 Pain in right toe(s): Secondary | ICD-10-CM

## 2022-08-24 NOTE — Progress Notes (Signed)
  Subjective:  Patient ID: Erika [name], female    DOB: 1966/02/07,  MRN: [id number]  OKTOBER GLUECKERT presents to clinic today for at risk foot care with history of diabetic neuropathy and painful elongated overgrown toenails which are tender when wearing enclosed shoe gear.  Chief Complaint  Patient presents with   Nail Problem    [facility] BS-94 yesterday A1C-7.0 PCP-Betty Martinique PCP [facility]-Have not seen her in awhile    New problem(s): None.   PCP is Martinique, Betty G, MD.  Allergies  Allergen Reactions   Codeine Other (See Comments)    dizzy  Other reaction(s): too strong/makes dizzy  dizzy  dizzy    dizzy  Other reaction(s): too strong/makes dizzy   Latex Hives   Other Other (See Comments)    Allergy to msg   Reaction- headache Allergy to saccharin-  Reaction- headache Other reaction(s): rash   Benadryl [Diphenhydramine] Rash   Nitrofurantoin Monohyd Macro Rash   Sulfa Antibiotics Rash    Review of Systems: Negative except as noted in the HPI.  Objective: No changes noted in today's physical examination. There were no vitals filed for this visit. Erika [name] is a pleasant 57 y.o. female in NAD. AAO x 3. Vascular CFT immediate b/l LE. Palpable DP/PT pulses b/l LE. Digital hair sparse b/l. Skin temperature gradient WNL b/l. No pain with calf compression b/l. No edema noted b/l. No cyanosis or clubbing noted b/l LE.  Neurologic Normal speech. Oriented to person, place, and time. Pt has subjective symptoms of neuropathy. Protective sensation diminished with 10g monofilament b/l. Vibratory sensation intact b/l.  Dermatologic Pedal integument with normal turgor, texture and tone b/l LE. No open wounds b/l. No interdigital macerations b/l.   Toenails bilateral great toes and bilateral 5th toes elongated, thickened, discolored with subungual debris. +Tenderness with dorsal palpation of nailplates.   Incurvated nailplate left great toe lateral border(s) with tenderness to  palpation. No erythema, no edema, no drainage noted.   No hyperkeratotic or porokeratotic lesions present.  Orthopedic: Muscle strength 5/5 to all lower extremity muscle groups bilaterally. No pain, crepitus or joint limitation noted with ROM bilateral LE. No gross bony deformities bilaterally.   Assessment/Plan: 1. Pain due to onychomycosis of toenails of both feet   2. Diabetic neuropathy with neurologic complication [facility])     -Patient was evaluated and treated. All patient's and/or POA's questions/concerns answered on today's visit. -Left great toenail offending nail border debrided and curretaged. Stable for now. We discussed nail avulsion as a possible treatment should she have any problems with ingrown toenail left great toe. -Toenails 1-5 b/l were debrided in length and girth with sterile nail nippers and dremel without iatrogenic bleeding.  -Patient/POA to call should there be question/concern in the interim.   Return in about 3 months (around 11/20/2022).  Marzetta Board, DPM

## 2022-08-27 ENCOUNTER — Other Ambulatory Visit: Payer: Self-pay | Admitting: Internal Medicine

## 2022-08-27 DIAGNOSIS — E042 Nontoxic multinodular goiter: Secondary | ICD-10-CM

## 2022-09-20 ENCOUNTER — Other Ambulatory Visit: Payer: BC Managed Care – PPO

## 2022-11-07 ENCOUNTER — Ambulatory Visit: Payer: BC Managed Care – PPO | Admitting: Allergy

## 2022-11-07 ENCOUNTER — Encounter: Payer: Self-pay | Admitting: Allergy

## 2022-11-07 VITALS — BP 140/70 | HR 90 | Temp 97.7°F | Resp 16

## 2022-11-07 DIAGNOSIS — J3089 Other allergic rhinitis: Secondary | ICD-10-CM

## 2022-11-07 DIAGNOSIS — H1013 Acute atopic conjunctivitis, bilateral: Secondary | ICD-10-CM | POA: Diagnosis not present

## 2022-11-07 DIAGNOSIS — R918 Other nonspecific abnormal finding of lung field: Secondary | ICD-10-CM | POA: Diagnosis not present

## 2022-11-07 DIAGNOSIS — J45991 Cough variant asthma: Secondary | ICD-10-CM

## 2022-11-07 MED ORDER — OLOPATADINE HCL 0.6 % NA SOLN: 2.0000 | Freq: Two times a day (BID) | NASAL | 5 refills | Status: DC

## 2022-11-07 NOTE — Patient Instructions (Addendum)
-   Continue avoidance measures indoor molds, outdoor molds, dust mites, and dog.  - Continue Allegra daily.   Zyrtec and Xyzal were not tolerated due to drowsiness.  - Recommend splitting ingredients of Ryaltris into nasal steroid spray (like Nasacort, Nasonex or Rhinocort) use 2 sprays each nostril daily for nasal congestion.  AND nasal antihistamine for drainage control.  Will send prescription for nasal antihistamine, Olopatadine, and if not covered then get Astepro which is over the counter.  Use nasal antihistamine 2 sprays each nostril twice a day as needed for nasal drainage/throat clearing.   - You can use an extra dose of the antihistamine, if needed, for breakthrough symptoms.   - Continue nasal saline rinses 2 times daily to remove allergens from the nasal cavities as well as help with mucous clearance (this is especially helpful to do before the nasal sprays are given) - Consider allergy shots as a means of long-term control if medication management is not effective enough. - Allergy shots "re-train" and "reset" the immune system to ignore environmental allergens and decrease the resulting immune response to those allergens (sneezing, itchy watery eyes, runny nose, nasal congestion, etc).    - Allergy shots improve symptoms in 80-85% of patients.   - Continue recommendations as per Dr Kendrick Fries with your asthma history and lung nodules  Follow-up in 3-4 months or sooner if needed

## 2022-11-07 NOTE — Progress Notes (Signed)
Follow-up Note  RE: KAILYNE WOOLFORK MRN: [id number] DOB: 1966/01/14 Date of Office Visit: 11/07/2022   History of present illness: Erika [name] is a 57 y.o. female presenting today for follow-up of allergic rhinitis with conjunctivitis.  She also has history of cough variant asthma and lung nodules followed by Dr. Kendrick Fries in pulmonology.  She was last seen in the office on 07/04/2022 by myself.  She is still having significant amount of postnasal drainage and drip with throat clearing.  She is also noticing some voice changes.  I did give her a Ryaltris sample at the last visit which was helpful but she states it was too expensive but she was not able to get this filled.  She is wondering if there is an alternative that is more affordable than this nasal spray. She did try the Xyzal however this made her sleepy but she then did Zyrtec which also made her sleepy but she went back to using Allegra. She is not interested in shot therapies at this time as she does do a several different injectable medications already.   Review of systems: Review of Systems  Constitutional: Negative.   HENT:         See HPI  Eyes: Negative.   Respiratory: Negative.    Cardiovascular: Negative.   Gastrointestinal: Negative.   Musculoskeletal: Negative.   Skin: Negative.   Allergic/Immunologic: Negative.   Neurological: Negative.      All other systems negative unless noted above in HPI  Past medical/social/surgical/family history have been reviewed and are unchanged unless specifically indicated below.  No changes  Medication List: Current Outpatient Medications  Medication Sig Dispense Refill   acetaminophen (TYLENOL) 325 MG tablet 1 tablet as needed     albuterol (VENTOLIN HFA) 108 (90 Base) MCG/ACT inhaler Inhale 2 puffs into the lungs every 6 (six) hours as needed for wheezing or shortness of breath. 18 g 1   atorvastatin (LIPITOR) 10 MG tablet Take 1 tablet by mouth at bedtime.     BD PEN  NEEDLE NANO 2ND GEN 32G X 4 MM MISC USE TO ADMINSTER INSULIN ONCE A DAY SUBCUTANEOUS AS DIRECTED     benzonatate (TESSALON) 100 MG capsule Take 100 mg by mouth 3 (three) times daily as needed for cough.     brimonidine (ALPHAGAN) 0.2 % ophthalmic solution INSTILL 1 DROP IN BOTH EYES FOUR TIMES A DAY     cholecalciferol (VITAMIN D) 25 MCG (1000 UNIT) tablet 1 tablet     Dapagliflozin-metFORMIN HCl ER (XIGDUO XR) 10-998 MG TB24 2 tablets     Dulaglutide (TRULICITY) 4.5 MG/0.5ML SOPN Inject 4.5 mg into the skin once a week.     fexofenadine (ALLEGRA) 180 MG tablet Take 180 mg by mouth daily.     glucose blood test strip For use when checking blood glucose     ibuprofen (ADVIL) 200 MG tablet 1 tablet with food or milk as needed     Insulin Glargine (BASAGLAR KWIKPEN) 100 UNIT/ML SOPN Inject 10 Units into the skin daily. INJECT 110 UNITS (TITRATE AS DIRECTED, MAX DAILY DOSE OF 100 UNITS) ONCE A DAY     latanoprost (XALATAN) 0.005 % ophthalmic solution Place 1 drop into both eyes at bedtime.     levothyroxine (SYNTHROID, LEVOTHROID) 25 MCG tablet TAKE 1 TABLET BY MOUTH EVERY DAY 30 tablet 0   NONFORMULARY OR COMPOUNDED ITEM Antifungal solution: Terbinafine 3%, Fluconazole 2%, Tea Tree Oil 5%, Urea 10%, Ibuprofen 2% in DMSO suspension #80mL  1 each 3   OneTouch Delica Lancets 30G MISC For use when checking blood glucose     OZEMPIC, 0.25 OR 0.5 MG/DOSE, 2 MG/3ML SOPN Inject 3 mLs into the skin once a week.     QUICKVUE AT-HOME COVID-19 TEST KIT See admin instructions.     RYALTRIS [id number] MCG/ACT SUSP Place 2 sprays into the nose in the morning and at bedtime. 29 g 5   Spacer/Aero-Holding Chambers (AEROCHAMBER PLUS) inhaler use with inhalor     SYMBICORT 80-4.5 MCG/ACT inhaler TAKE 2 PUFFS BY MOUTH TWICE A DAY 10.2 each 2   triamterene-hydrochlorothiazide (MAXZIDE-25) 37.5-25 MG tablet Take 1 tablet by mouth every morning.     VASCEPA 1 g capsule Take 1 g by mouth 2 (two) times daily.     fluticasone  (FLONASE) 50 MCG/ACT nasal spray Place into the nose.     No current facility-administered medications for this visit.     Known medication allergies: Allergies  Allergen Reactions   Codeine Other (See Comments)    dizzy  Other reaction(s): too strong/makes dizzy  dizzy  dizzy    dizzy  Other reaction(s): too strong/makes dizzy   Latex Hives   Other Other (See Comments)    Allergy to msg   Reaction- headache Allergy to saccharin-  Reaction- headache Other reaction(s): rash   Benadryl [Diphenhydramine] Rash   Nitrofurantoin Monohyd Macro Rash   Sulfa Antibiotics Rash     Physical examination: Blood pressure (!) 140/70, pulse 90, temperature 97.7 F (36.5 C), temperature source Temporal, resp. rate 16, last menstrual period 08/20/2014, SpO2 97 %.  General: Alert, interactive, in no acute distress. HEENT: PERRLA, TMs pearly gray, turbinates minimally edematous without discharge, post-pharynx non erythematous, + cobblestone. Neck: Supple without lymphadenopathy. Lungs: Clear to auscultation without wheezing, rhonchi or rales. {no increased work of breathing. CV: Normal S1, S2 without murmurs. Abdomen: Nondistended, nontender. Skin: Warm and dry, without lesions or rashes. Extremities:  No clubbing, cyanosis or edema. Neuro:   Grossly intact.  Diagnositics/Labs: None today  Assessment and plan: Allergic rhinitis with conjunctivitis   - Continue avoidance measures indoor molds, outdoor molds, dust mites, and dog.  - Continue Allegra daily.   Zyrtec and Xyzal were not tolerated due to drowsiness.  - Recommend splitting ingredients of Ryaltris into nasal steroid spray (like Nasacort, Nasonex or Rhinocort) use 2 sprays each nostril daily for nasal congestion.  AND nasal antihistamine for drainage control.  Will send prescription for nasal antihistamine, Olopatadine, and if not covered then get Astepro which is over the counter.  Use nasal antihistamine 2 sprays each  nostril twice a day as needed for nasal drainage/throat clearing.   - You can use an extra dose of the antihistamine, if needed, for breakthrough symptoms.   - Continue nasal saline rinses 2 times daily to remove allergens from the nasal cavities as well as help with mucous clearance (this is especially helpful to do before the nasal sprays are given) - Consider allergy shots as a means of long-term control if medication management is not effective enough. - Allergy shots "re-train" and "reset" the immune system to ignore environmental allergens and decrease the resulting immune response to those allergens (sneezing, itchy watery eyes, runny nose, nasal congestion, etc).    - Allergy shots improve symptoms in 80-85% of patients.   Cough variant asthma Lung nodules - Continue recommendations as per Dr Kendrick Fries with your asthma history and lung nodules  Follow-up in 3-4 months or sooner if needed  I appreciate the opportunity to take part in Viana's care. Please do not hesitate to contact me with questions.  Sincerely,   Margo Aye, MD Allergy/Immunology [facility]

## 2022-11-19 ENCOUNTER — Other Ambulatory Visit: Payer: Self-pay | Admitting: Obstetrics & Gynecology

## 2022-11-19 DIAGNOSIS — Z1231 Encounter for screening mammogram for malignant neoplasm of breast: Secondary | ICD-10-CM

## 2022-11-22 ENCOUNTER — Ambulatory Visit
Admission: RE | Admit: 2022-11-22 | Discharge: 2022-11-22 | Disposition: A | Discharge status: Home / Self Care | Payer: BC Managed Care – PPO | Source: Ambulatory Visit | Attending: Obstetrics & Gynecology | Admitting: Obstetrics & Gynecology

## 2022-11-22 DIAGNOSIS — Z1231 Encounter for screening mammogram for malignant neoplasm of breast: Secondary | ICD-10-CM

## 2022-12-25 DIAGNOSIS — G629 Polyneuropathy, unspecified: Secondary | ICD-10-CM | POA: Insufficient documentation

## 2022-12-25 DIAGNOSIS — J45909 Unspecified asthma, uncomplicated: Secondary | ICD-10-CM | POA: Insufficient documentation

## 2022-12-25 DIAGNOSIS — H409 Unspecified glaucoma: Secondary | ICD-10-CM | POA: Insufficient documentation

## 2022-12-25 DIAGNOSIS — M1990 Unspecified osteoarthritis, unspecified site: Secondary | ICD-10-CM | POA: Insufficient documentation

## 2022-12-26 ENCOUNTER — Encounter: Payer: Self-pay | Admitting: Podiatry

## 2022-12-26 ENCOUNTER — Ambulatory Visit (INDEPENDENT_AMBULATORY_CARE_PROVIDER_SITE_OTHER): Payer: BC Managed Care – PPO | Admitting: Podiatry

## 2022-12-26 VITALS — BP 142/74

## 2022-12-26 DIAGNOSIS — E1149 Type 2 diabetes mellitus with other diabetic neurological complication: Secondary | ICD-10-CM

## 2022-12-26 DIAGNOSIS — M79675 Pain in left toe(s): Secondary | ICD-10-CM

## 2022-12-26 DIAGNOSIS — E1140 Type 2 diabetes mellitus with diabetic neuropathy, unspecified: Secondary | ICD-10-CM

## 2022-12-26 DIAGNOSIS — L600 Ingrowing nail: Secondary | ICD-10-CM | POA: Diagnosis not present

## 2022-12-26 DIAGNOSIS — B351 Tinea unguium: Secondary | ICD-10-CM

## 2022-12-26 DIAGNOSIS — M79674 Pain in right toe(s): Secondary | ICD-10-CM

## 2022-12-26 NOTE — Patient Instructions (Signed)
Terbinafine Tablets What is this medication? TERBINAFINE (TER bin a feen) treats fungal infections of the nails. It belongs to a group of medications called antifungals. It will not treat infections caused by bacteria or viruses. This medicine may be used for other purposes; ask your health care provider or pharmacist if you have questions. COMMON BRAND NAME(S): Lamisil, Terbinex What should I tell my care team before I take this medication? They need to know if you have any of these conditions: Liver disease An unusual or allergic reaction to terbinafine, other medications, foods, dyes, or preservatives Pregnant or trying to get pregnant Breast-feeding How should I use this medication? Take this medication by mouth with water. Take it as directed on the prescription label at the same time every day. You can take it with or without food. If it upsets your stomach, take it with food. Keep taking it unless your care team tells you to stop. A special MedGuide will be given to you by the pharmacist with each prescription and refill. Be sure to read this information carefully each time. Talk to your care team regarding the use of this medication in children. Special care may be needed. Overdosage: If you think you have taken too much of this medicine contact a poison control center or emergency room at once. NOTE: This medicine is only for you. Do not share this medicine with others. What if I miss a dose? If you miss a dose, take it as soon as you can unless it is more than 4 hours late. If it is more than 4 hours late, skip the missed dose. Take the next dose at the normal time. What may interact with this medication? Do not take this medication with any of the following: Pimozide Thioridazine This medication may also interact with the following: Beta blockers Caffeine Certain medications for mental health conditions Cimetidine Cyclosporine Medications for fungal infections like fluconazole  and ketoconazole Medications for irregular heartbeat like amiodarone, flecainide and propafenone Rifampin Warfarin This list may not describe all possible interactions. Give your health care provider a list of all the medicines, herbs, non-prescription drugs, or dietary supplements you use. Also tell them if you smoke, drink alcohol, or use illegal drugs. Some items may interact with your medicine. What should I watch for while using this medication? Visit your care team for regular checks on your progress. You may need blood work while you are taking this medication. It may be some time before you see the benefit from this medication. This medication may cause serious skin reactions. They can happen weeks to months after starting the medication. Contact your care team right away if you notice fevers or flu-like symptoms with a rash. The rash may be red or purple and then turn into blisters or peeling of the skin. Or, you might notice a red rash with swelling of the face, lips or lymph nodes in your neck or under your arms. This medication can make you more sensitive to the sun. Keep out of the sun, If you cannot avoid being in the sun, wear protective clothing and sunscreen. Do not use sun lamps or tanning beds/booths. What side effects may I notice from receiving this medication? Side effects that you should report to your care team as soon as possible: Allergic reactions--skin rash, itching, hives, swelling of the face, lips, tongue, or throat Change in sense of smell Change in taste Infection--fever, chills, cough, or sore throat Liver injury--right upper belly pain, loss of appetite, nausea,   light-colored stool, dark yellow or brown urine, yellowing skin or eyes, unusual weakness or fatigue Low red blood cell level--unusual weakness or fatigue, dizziness, headache, trouble breathing Lupus-like syndrome--joint pain, swelling, or stiffness, butterfly-shaped rash on the face, rashes that get worse  in the sun, fever, unusual weakness or fatigue Rash, fever, and swollen lymph nodes Redness, blistering, peeling, or loosening of the skin, including inside the mouth Unusual bruising or bleeding Worsening mood, feelings of depression Side effects that usually do not require medical attention (report to your care team if they continue or are bothersome): Diarrhea Gas Headache Nausea Stomach pain Upset stomach This list may not describe all possible side effects. Call your doctor for medical advice about side effects. You may report side effects to FDA at 1-800-FDA-1088. Where should I keep my medication? Keep out of the reach of children and pets. Store between 20 and 25 degrees C (68 and 77 degrees F). Protect from light. Get rid of any unused medication after the expiration date. To get rid of medications that are no longer needed or have expired: Take the medication to a medication take-back program. Check with your pharmacy or law enforcement to find a location. If you cannot return the medication, check the label or package insert to see if the medication should be thrown out in the garbage or flushed down the toilet. If you are not sure, ask your care team. If it is safe to put it in the trash, take the medication out of the container. Mix the medication with cat litter, dirt, coffee grounds, or other unwanted substance. Seal the mixture in a bag or container. Put it in the trash. NOTE: This sheet is a summary. It may not cover all possible information. If you have questions about this medicine, talk to your doctor, pharmacist, or health care provider.  2024 Elsevier/Gold Standard (2021-01-11 00:00:00)  

## 2023-01-02 NOTE — Progress Notes (Signed)
Subjective:  Patient ID: Erika [name], female    DOB: 05/01/1966,  MRN: [id number]  Erika [name] presents to clinic today for at risk foot care with history of diabetic neuropathy. Patient states her right great toenail came off. She is also interested treatment options available for her nail fungus. Chief Complaint  Patient presents with   Nail Problem    DFC,Referring Provider Warner Mccreedy, MD,lov: long time, not sure,A1C:10.2      New problem(s): None.   PCP is [place], Betty G, MD.  Allergies  Allergen Reactions   Codeine Other (See Comments)    dizzy  Other reaction(s): too strong/makes dizzy  dizzy  dizzy    dizzy  Other reaction(s): too strong/makes dizzy   Latex Hives   Other Other (See Comments)    Allergy to msg   Reaction- headache Allergy to saccharin-  Reaction- headache Other reaction(s): rash   Benadryl [Diphenhydramine] Rash   Nitrofurantoin Monohyd Macro Rash   Sulfa Antibiotics Rash    Review of Systems: Negative except as noted in the HPI.  Objective: No changes noted in today's physical examination. Vitals:   12/26/22 1616 12/26/22 1627  BP: (!) 165/83 (!) 142/74   Erika [name] is a pleasant 57 y.o. female in NAD. AAO x 3.  Vascular Examination: Capillary refill time immediate b/l. Vascular status intact b/l with palpable pedal pulses. Pedal hair present b/l. No pain with calf compression b/l. Skin temperature gradient WNL b/l. No cyanosis or clubbing b/l. No ischemia or gangrene noted b/l.   Neurological Examination: Vibratory sensation intact b/l. Pt has subjective symptoms of neuropathy. Protective sensation diminished with 10g monofilament b/l.  Dermatological Examination: Pedal skin with normal turgor, texture and tone b/l.  No open wounds. No interdigital macerations.   Toenails 2-5 b/l thick, discolored, elongated with subungual debris and pain on dorsal palpation.   Evidence of onycholysis of right great toe. Incurvated  nailplate lateral border left hallux.  Nail border hypertrophy absent. There is tenderness to palpation. Sign(s) of infection: no clinical signs of infection noted on examination today.. There is noted onchyolysis of entire nailplate of right great toe.  The nailbed remains intact. There is no erythema, no edema, no drainage, no underlying fluctuance.  Musculoskeletal Examination: Normal muscle strength 5/5 to all lower extremity muscle groups bilaterally. No pain, crepitus or joint limitation noted with ROM b/l LE. No gross bony pedal deformities b/l. Patient ambulates independently without assistive aids.  Radiographs: None  Assessment/Plan: 1. Pain due to onychomycosis of toenails of both feet   2. Ingrown toenail without infection   3. Diabetic neuropathy with neurologic complication (HCC)     -Consent given for treatment as described below: -Examined patient. -Discussed treatment options for mycotic toenails including oral Lamisil. She will discuss with her Endocrinologist and see Dr. Carlota Raspberry if she would like to proceed with this. -Continue foot and shoe inspections daily. Monitor blood glucose per PCP/Endocrinologist's recommendations. -Patient to continue soft, supportive shoe gear daily. -Toenails were debrided in length and girth 2-5 bilaterally with sterile nail nippers and dremel without iatrogenic bleeding.  -No invasive procedure(s) performed. Offending nail border debrided and curretaged lateral border left hallux utilizing sterile nail nipper and currette. Border(s) cleansed with alcohol and triple antibiotic ointment applied. Patient/POA/Caregiver/Facility instructed to apply Neosporin Cream  to left great toe once daily for 7 days. Call office if there are any concerns. -Patient/POA to call should there be question/concern in the interim.   Return in about 9  weeks (around 02/27/2023).  Freddie Breech, DPM

## 2023-02-14 ENCOUNTER — Ambulatory Visit: Payer: BC Managed Care – PPO | Admitting: Allergy

## 2023-02-19 ENCOUNTER — Other Ambulatory Visit: Payer: Self-pay | Admitting: Internal Medicine

## 2023-02-19 DIAGNOSIS — E042 Nontoxic multinodular goiter: Secondary | ICD-10-CM

## 2023-02-26 ENCOUNTER — Other Ambulatory Visit: Payer: BC Managed Care – PPO

## 2023-02-28 ENCOUNTER — Ambulatory Visit
Admission: RE | Admit: 2023-02-28 | Discharge: 2023-02-28 | Disposition: A | Discharge status: Home / Self Care | Payer: BC Managed Care – PPO | Source: Ambulatory Visit | Attending: Internal Medicine | Admitting: Internal Medicine

## 2023-02-28 DIAGNOSIS — E042 Nontoxic multinodular goiter: Secondary | ICD-10-CM

## 2023-03-01 ENCOUNTER — Other Ambulatory Visit: Payer: Self-pay | Admitting: Allergy

## 2023-03-11 ENCOUNTER — Ambulatory Visit: Payer: BC Managed Care – PPO | Admitting: Podiatry

## 2023-03-13 ENCOUNTER — Ambulatory Visit (INDEPENDENT_AMBULATORY_CARE_PROVIDER_SITE_OTHER): Payer: BC Managed Care – PPO | Admitting: Podiatry

## 2023-03-13 ENCOUNTER — Encounter: Payer: Self-pay | Admitting: Podiatry

## 2023-03-13 DIAGNOSIS — M79674 Pain in right toe(s): Secondary | ICD-10-CM

## 2023-03-13 DIAGNOSIS — E1140 Type 2 diabetes mellitus with diabetic neuropathy, unspecified: Secondary | ICD-10-CM | POA: Diagnosis not present

## 2023-03-13 DIAGNOSIS — E119 Type 2 diabetes mellitus without complications: Secondary | ICD-10-CM | POA: Diagnosis not present

## 2023-03-13 DIAGNOSIS — E1149 Type 2 diabetes mellitus with other diabetic neurological complication: Secondary | ICD-10-CM | POA: Diagnosis not present

## 2023-03-13 DIAGNOSIS — M79675 Pain in left toe(s): Secondary | ICD-10-CM

## 2023-03-13 DIAGNOSIS — B351 Tinea unguium: Secondary | ICD-10-CM

## 2023-03-17 NOTE — Progress Notes (Signed)
ANNUAL DIABETIC FOOT EXAM  Subjective: Erika [name] presents today annual diabetic foot exam.  Chief Complaint  Patient presents with   Diabetes    Erika [name] BS-130 A1C-7 PCPV-02/2023   Patient confirms h/o diabetes.  Patient denies any h/o foot wounds.  Patient has been diagnosed with neuropathy.  Risk factors: diabetes, neuropathy, HTN, dyslipidemia, h/o tobacco use in remission.  [place], Betty G, MD is patient's PCP.  Past Medical History:  Diagnosis Date   Arthritis    hands, neck   Asthma    no inhaler, no problems x 5 years.   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    Cervical dysplasia    Depression    no med   Diabetes mellitus without complication (HCC)    Type 2   Ependymoma of spinal cord (HCC) 1999   Radiation and surgery   Fibroid    Goiter    Heart murmur    never had any problems   Hyperlipidemia    Hypertension    Hypothyroid    Infertility, female    Neuromuscular disorder (HCC)    neuropathy in feet and finger tips   Neuropathy    located fingers and toes   Ocular hypertension    PCOS (polycystic ovarian syndrome)    Seasonal allergies    Umbilical hernia    Urinary incontinence    Patient Active Problem List   Diagnosis Date Noted   Arthritis 12/25/2022   Asthma 12/25/2022   Glaucoma 12/25/2022   Neuropathy 12/25/2022   Vitreous floaters of left eye 11/14/2020   Age-related nuclear cataract of both eyes 11/14/2020   Postoperative follow-up 07/29/2020   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 07/27/2020   Dyslipidemia 05/25/2020   Dysphagia 05/25/2020   Hyperglycemia due to type 2 diabetes mellitus (HCC) 05/25/2020   Hypertriglyceridemia 05/25/2020   Multinodular goiter 05/25/2020   Obesity 05/25/2020   Diabetes mellitus (HCC) 12/25/2016   Epidermoid cyst of skin of ear 12/25/2016   Goiter 12/25/2016   Laryngopharyngeal reflux (LPR) 12/25/2016   Incisional hernia 03/20/2013   Hypertension  08/09/2011   PCOS (polycystic ovarian syndrome) 08/09/2011   Past Surgical History:  Procedure Laterality Date   BACK SURGERY  12/1997,01/12/1998,01/20/1998   x 3 surgeries - L4/L5/L6   BREAST BIOPSY Right 12/14/2004   COLPOSCOPY     DILATATION & CURETTAGE/HYSTEROSCOPY WITH MYOSURE N/A 12/14/2014   Procedure: DILATATION & CURETTAGE/HYSTEROSCOPY WITH MYOSURE;  Surgeon: Dara Lords, MD;  Location: [facility];  Service: Gynecology;  Laterality: N/A;  to follow 2nd case 10:00?  needs one hour OR time   DILATION AND CURETTAGE OF UTERUS  2010   endometrial polyp   HYSTEROSCOPY  2004   MYOMECTOMY  2004   OVARY SURGERY  09/1998   bilateral ovarian transposition due to radiation treatment for spine lesion   WISDOM TOOTH EXTRACTION     Current Outpatient Medications on File Prior to Visit  Medication Sig Dispense Refill   acetaminophen (TYLENOL) 325 MG tablet 1 tablet as needed     albuterol (VENTOLIN HFA) 108 (90 Base) MCG/ACT inhaler Inhale 2 puffs into the lungs every 6 (six) hours as needed for wheezing or shortness of breath. 18 g 1   atorvastatin (LIPITOR) 10 MG tablet Take 1 tablet by mouth at bedtime.     BD PEN NEEDLE NANO 2ND GEN 32G X 4 MM MISC USE TO ADMINSTER INSULIN ONCE A DAY SUBCUTANEOUS AS DIRECTED     benzonatate (TESSALON)  100 MG capsule Take 100 mg by mouth 3 (three) times daily as needed for cough.     brimonidine (ALPHAGAN) 0.2 % ophthalmic solution INSTILL 1 DROP IN BOTH EYES FOUR TIMES A DAY     cholecalciferol (VITAMIN D) 25 MCG (1000 UNIT) tablet 1 tablet     Dapagliflozin-metFORMIN HCl ER (XIGDUO XR) 10-998 MG TB24 2 tablets     Dulaglutide (TRULICITY) 4.5 MG/0.5ML SOPN Inject 4.5 mg into the skin once a week.     fexofenadine (ALLEGRA) 180 MG tablet Take 180 mg by mouth daily.     fluticasone (FLONASE) 50 MCG/ACT nasal spray Place into the nose.     glucose blood test strip For use when checking blood glucose     ibuprofen (ADVIL) 200 MG tablet 1 tablet with food or  milk as needed     Insulin Glargine (BASAGLAR KWIKPEN) 100 UNIT/ML SOPN Inject 10 Units into the skin daily. INJECT 110 UNITS (TITRATE AS DIRECTED, MAX DAILY DOSE OF 100 UNITS) ONCE A DAY     latanoprost (XALATAN) 0.005 % ophthalmic solution Place 1 drop into both eyes at bedtime.     levothyroxine (SYNTHROID, LEVOTHROID) 25 MCG tablet TAKE 1 TABLET BY MOUTH EVERY DAY 30 tablet 0   NONFORMULARY OR COMPOUNDED ITEM Antifungal solution: Terbinafine 3%, Fluconazole 2%, Tea Tree Oil 5%, Urea 10%, Ibuprofen 2% in DMSO suspension #14mL 1 each 3   Olopatadine HCl 0.6 % SOLN PLACE 2 SPRAYS INTO BOTH NOSTRILS 2 (TWO) TIMES DAILY 30.5 g 1   OneTouch Delica Lancets 30G MISC For use when checking blood glucose     OZEMPIC, 0.25 OR 0.5 MG/DOSE, 2 MG/3ML SOPN Inject 3 mLs into the skin once a week.     QUICKVUE AT-HOME COVID-19 TEST KIT See admin instructions.     RYALTRIS [id number] MCG/ACT SUSP Place 2 sprays into the nose in the morning and at bedtime. 29 g 5   Spacer/Aero-Holding Chambers (AEROCHAMBER PLUS) inhaler use with inhalor     SYMBICORT 80-4.5 MCG/ACT inhaler TAKE 2 PUFFS BY MOUTH TWICE A DAY 10.2 each 2   triamterene-hydrochlorothiazide (MAXZIDE-25) 37.5-25 MG tablet Take 1 tablet by mouth every morning.     VASCEPA 1 g capsule Take 1 g by mouth 2 (two) times daily.     No current facility-administered medications on file prior to visit.    Allergies  Allergen Reactions   Codeine Other (See Comments)    dizzy  Other reaction(s): too strong/makes dizzy  dizzy  dizzy    dizzy  Other reaction(s): too strong/makes dizzy   Latex Hives   Other Other (See Comments)    Allergy to msg   Reaction- headache Allergy to saccharin-  Reaction- headache Other reaction(s): rash   Benadryl [Diphenhydramine] Rash   Dust Mite Extract Itching   Nitrofurantoin Monohyd Macro Rash   Sulfa Antibiotics Rash   Social History   Occupational History   Not on file  Tobacco Use   Smoking status: Former     Current packs/day: 0.00    Average packs/day: 0.3 packs/day for 9.0 years (2.3 ttl pk-yrs)    Types: Cigarettes    Start date: 08/22/1976    Quit date: 08/22/1985    Years since quitting: 37.5    Passive exposure: Past   Smokeless tobacco: Never  Vaping Use   Vaping status: Never Used  Substance and Sexual Activity   Alcohol use: Not Currently    Comment: rare wine   Drug use: No  Sexual activity: Not Currently    Birth control/protection: Post-menopausal    Comment: 1st intercourse 47 yo-5 partners   Family History  Problem Relation Age of Onset   Lung cancer Mother    Hypertension Maternal Aunt    Breast cancer Maternal Aunt        Age 33   Thyroid disease Maternal Aunt    Colon cancer Maternal Aunt    Lung cancer Maternal Uncle    Esophageal cancer Maternal Uncle    Cirrhosis Maternal Aunt    Colon cancer Maternal Aunt    Throat cancer Brother    Immunization History  Administered Date(s) Administered   Hepatitis B, ADULT 12/06/2016, 01/15/2017   PFIZER(Purple Top)SARS-COV-2 Vaccination 08/08/2019, 08/29/2019     Review of Systems: Negative except as noted in the HPI.   Objective: There were no vitals filed for this visit.  LENETTA PICHE is a pleasant 57 y.o. female in NAD. AAO X 3.  Vascular Examination: Capillary refill time immediate b/l. Vascular status intact b/l with palpable pedal pulses. Pedal hair present b/l. No pain with calf compression b/l. Skin temperature gradient WNL b/l. No cyanosis or clubbing b/l. No ischemia or gangrene noted b/l.   Neurological Examination: Pt has subjective symptoms of neuropathy. Protective sensation diminished with 10g monofilament b/l.  Dermatological Examination: Pedal skin with normal turgor, texture and tone b/l.  No open wounds. No interdigital macerations.   Toenails 1-5 b/l thick, discolored, elongated with subungual debris and pain on dorsal palpation.   No corns, calluses nor porokeratotic lesions  noted.  Musculoskeletal Examination: Muscle strength 5/5 to all lower extremity muscle groups bilaterally. No pain, crepitus or joint limitation noted with ROM bilateral LE. No gross bony deformities bilaterally.  Radiographs: None  Lab Results  Component Value Date   HGBA1C 10.2 10/17/2011   ADA Risk Categorization: Low Risk :  Patient has all of the following: Intact protective sensation No prior foot ulcer  No severe deformity Pedal pulses present  Assessment: 1. Pain due to onychomycosis of toenails of both feet   2. Diabetic neuropathy with neurologic complication (HCC)   3. Encounter for diabetic foot exam [facility])     Plan: -Patient was evaluated and treated. All patient's and/or POA's questions/concerns answered on today's visit. -Continue supportive shoe gear daily. -Toenails 1-5 b/l were debrided in length and girth with sterile nail nippers and dremel without iatrogenic bleeding.  -Patient/POA to call should there be question/concern in the interim. Return in about 3 months (around 06/13/2023).  Freddie Breech, DPM

## 2023-04-11 ENCOUNTER — Encounter: Payer: Self-pay | Admitting: Allergy

## 2023-04-11 ENCOUNTER — Other Ambulatory Visit: Payer: Self-pay

## 2023-04-11 ENCOUNTER — Ambulatory Visit: Payer: BC Managed Care – PPO | Admitting: Allergy

## 2023-04-11 VITALS — BP 114/74 | HR 88 | Temp 98.5°F | Resp 18

## 2023-04-11 DIAGNOSIS — H1013 Acute atopic conjunctivitis, bilateral: Secondary | ICD-10-CM

## 2023-04-11 DIAGNOSIS — J3089 Other allergic rhinitis: Secondary | ICD-10-CM | POA: Diagnosis not present

## 2023-04-11 DIAGNOSIS — J45991 Cough variant asthma: Secondary | ICD-10-CM

## 2023-04-11 DIAGNOSIS — E049 Nontoxic goiter, unspecified: Secondary | ICD-10-CM | POA: Diagnosis not present

## 2023-04-11 MED ORDER — MOMETASONE FUROATE 50 MCG/ACT NA SUSP: NASAL | 5 refills | Status: AC

## 2023-04-11 MED ORDER — OLOPATADINE HCL 0.6 % NA SOLN: 2.0000 | Freq: Two times a day (BID) | NASAL | 5 refills | Status: DC

## 2023-04-11 MED ORDER — FEXOFENADINE HCL 180 MG PO TABS: 180.0000 mg | ORAL_TABLET | Freq: Every day | ORAL | 5 refills | Status: AC

## 2023-04-11 NOTE — Patient Instructions (Addendum)
-   Continue avoidance measures indoor molds, outdoor molds, dust mites, and dog. - Continue Allegra daily.   - Continue Olopatadine 2 sprays each nostril twice a day as needed for nasal drainage/throat clearing.  - Use Nasonex (mometasone) 2 sprays each nostril daily for 1-2 weeks at a time before stopping once nasal congestion improves for maximum benefit.  This is the other medication in Ryaltris with Olopatadine.  - You can use an extra dose of the antihistamine, if needed, for breakthrough symptoms.  - Continue nasal saline rinses 2 times daily to remove allergens from the nasal cavities as well as help with mucous clearance (this is especially helpful to do before the nasal sprays are given) - Consider allergy shots as a means of long-term control if medication management is not effective enough. - Allergy shots "re-train" and "reset" the immune system to ignore environmental allergens and decrease the resulting immune response to those allergens (sneezing, itchy watery eyes, runny nose, nasal congestion, etc).    - Allergy shots improve symptoms in 80-85% of patients.   - Continue recommendations as per pulmonology (Dr Isaiah Serge) with your asthma history and lung nodules  Follow-up in 4-6 months or sooner if needed   Dr Kendrick Fries is now at [facility]

## 2023-04-11 NOTE — Progress Notes (Signed)
Follow-up Note  RE: Erika [name] MRN: [id number] DOB: July 03, 1965 Date of Office Visit: 04/11/2023   History of present illness: Erika [name] is a 57 y.o. female presenting today for follow-up of allergic rhinitis with conjunctivitis, cough variant asthma.  She was last seen in the office on 11/07/22 by myself.  Discussed the use of AI scribe software for clinical note transcription with the patient, who gave verbal consent to proceed.  She has history of goiter and had a recent recent ultrasound in September but has not received any results yet.  She expresses worry about the growth of the goiter, noting difficulty swallowing pills and changes in her voice. She also reports a sensation of her throat "caving in" when lying flat, impacting her ability to sleep comfortably.  In addition to the goiter, she has been experiencing some sinus issues. She has been using olopatadine nasal spray twice daily, which she reports as helpful. However, she also notes a persistent issue with nasal congestion, particularly on the side of her deviated septum. She has been managing this with saline spray and Allegra.  The nasal spray Ryaltris was not covered needing to separate the 2 out however currently not using a steroid component.  She also mentions a recent episode of "burnt tongue," which resolved after a few days.   Lastly, she notes a change in her sense of smell since having COVID-19 two years ago. She reports that her sense of smell has not fully returned, and strong smells can trigger asthma attacks. She has been managing her respiratory symptoms with Symbicort..  She was following with pulmonology at [facility] with Dr. Kendrick Fries however he is no longer at [facility] and she has not had any follow-up since her last visit.  She actually was not aware that they had moved.  She states she still has enough of her inhaler medications at this time     Review of systems: 10pt ROS negative unless noted above in HPI    All other systems negative unless noted above in HPI  Past medical/social/surgical/family history have been reviewed and are unchanged unless specifically indicated below.  No changes  Medication List: Current Outpatient Medications  Medication Sig Dispense Refill   acetaminophen (TYLENOL) 325 MG tablet 1 tablet as needed     albuterol (VENTOLIN HFA) 108 (90 Base) MCG/ACT inhaler Inhale 2 puffs into the lungs every 6 (six) hours as needed for wheezing or shortness of breath. 18 g 1   atorvastatin (LIPITOR) 10 MG tablet Take 1 tablet by mouth at bedtime.     BD PEN NEEDLE NANO 2ND GEN 32G X 4 MM MISC USE TO ADMINSTER INSULIN ONCE A DAY SUBCUTANEOUS AS DIRECTED     brimonidine (ALPHAGAN) 0.2 % ophthalmic solution INSTILL 1 DROP IN BOTH EYES FOUR TIMES A DAY     cholecalciferol (VITAMIN D) 25 MCG (1000 UNIT) tablet 1 tablet     Dapagliflozin-metFORMIN HCl ER (XIGDUO XR) 10-998 MG TB24 2 tablets     fexofenadine (ALLEGRA) 180 MG tablet Take 180 mg by mouth daily.     glucose blood test strip For use when checking blood glucose     ibuprofen (ADVIL) 200 MG tablet 1 tablet with food or milk as needed     latanoprost (XALATAN) 0.005 % ophthalmic solution Place 1 drop into both eyes at bedtime.     levothyroxine (SYNTHROID, LEVOTHROID) 25 MCG tablet TAKE 1 TABLET BY MOUTH EVERY DAY 30 tablet 0   Olopatadine HCl  0.6 % SOLN PLACE 2 SPRAYS INTO BOTH NOSTRILS 2 (TWO) TIMES DAILY 30.5 g 1   OneTouch Delica Lancets 30G MISC For use when checking blood glucose     OZEMPIC, 0.25 OR 0.5 MG/DOSE, 2 MG/3ML SOPN Inject 3 mLs into the skin once a week.     QUICKVUE AT-HOME COVID-19 TEST KIT See admin instructions.     Spacer/Aero-Holding Chambers (AEROCHAMBER PLUS) inhaler use with inhalor     triamterene-hydrochlorothiazide (MAXZIDE-25) 37.5-25 MG tablet Take 1 tablet by mouth every morning.     VASCEPA 1 g capsule Take 1 g by mouth 2 (two) times daily.     fluticasone (FLONASE) 50 MCG/ACT nasal spray  Place into the nose.     Insulin Glargine (BASAGLAR KWIKPEN) 100 UNIT/ML SOPN Inject 10 Units into the skin daily. INJECT 110 UNITS (TITRATE AS DIRECTED, MAX DAILY DOSE OF 100 UNITS) ONCE A DAY (Patient not taking: Reported on 04/11/2023)     No current facility-administered medications for this visit.     Known medication allergies: Allergies  Allergen Reactions   Codeine Other (See Comments)    dizzy  Other reaction(s): too strong/makes dizzy  dizzy  dizzy    dizzy  Other reaction(s): too strong/makes dizzy   Latex Hives   Other Other (See Comments)    Allergy to msg   Reaction- headache Allergy to saccharin-  Reaction- headache Other reaction(s): rash   Benadryl [Diphenhydramine] Rash   Dust Mite Extract Itching   Nitrofurantoin Monohyd Macro Rash   Sulfa Antibiotics Rash     Physical examination: Blood pressure 114/74, pulse 88, temperature 98.5 F (36.9 C), temperature source Temporal, resp. rate 18, last menstrual period 08/20/2014, SpO2 96%.  General: Alert, interactive, in no acute distress. HEENT: PERRLA, TMs pearly gray, turbinates moderately edematous without discharge, post-pharynx non erythematous. Neck: Supple without lymphadenopathy. Lungs: Clear to auscultation without wheezing, rhonchi or rales. {no increased work of breathing. CV: Normal S1, S2 without murmurs. Abdomen: Nondistended, nontender. Skin: Warm and dry, without lesions or rashes. Extremities:  No clubbing, cyanosis or edema. Neuro:   Grossly intact.  Diagnositics/Labs: None today  Assessment and plan: Allergic rhinitis with conjunctivitis  - Continue avoidance measures indoor molds, outdoor molds, dust mites, and dog. - Continue Allegra daily.   - Continue Olopatadine 2 sprays each nostril twice a day as needed for nasal drainage/throat clearing.  - Use Nasonex (mometasone) 2 sprays each nostril daily for 1-2 weeks at a time before stopping once nasal congestion improves for maximum  benefit.  This is the other medication in Ryaltris with Olopatadine.  - You can use an extra dose of the antihistamine, if needed, for breakthrough symptoms.  - Continue nasal saline rinses 2 times daily to remove allergens from the nasal cavities as well as help with mucous clearance (this is especially helpful to do before the nasal sprays are given) - Consider allergy shots as a means of long-term control if medication management is not effective enough. - Allergy shots "re-train" and "reset" the immune system to ignore environmental allergens and decrease the resulting immune response to those allergens (sneezing, itchy watery eyes, runny nose, nasal congestion, etc).    - Allergy shots improve symptoms in 80-85% of patients.   Cough variant asthma Pulmonary nodules - Continue recommendations as per pulmonology with your asthma history and lung nodules  Goiter -Advise she see if Dr. Sharl Ma can get the official report from radiology -Did advise that thyroid abnormalities with her hypo or hyperthyroidism can  impact the sinus tract more so hypothyroidism can lead to more congestion symptoms.  If her TSH and thyroid function is normal then I would not expect to have any impact on her nasal symptoms.  However if the goiter has enlarged and he could have some impact on her swallowing and voice as it could be impinging on those areas  Follow-up in 4-6 months or sooner if needed  I appreciate the opportunity to take part in Erika [name]'s care. Please do not hesitate to contact me with questions.  Sincerely,   Margo Aye, MD Allergy/Immunology [facility]

## 2023-04-17 NOTE — Addendum Note (Signed)
Encounter addended by: Jenel Lucks on: 04/17/2023 8:52 AM  Actions taken: Imaging Exam ended

## 2023-05-31 ENCOUNTER — Encounter: Payer: Self-pay | Admitting: Family Medicine

## 2023-05-31 NOTE — Telephone Encounter (Signed)
 Care team updated and letter sent for eye exam notes.

## 2023-06-26 ENCOUNTER — Encounter: Payer: Self-pay | Admitting: Podiatry

## 2023-06-26 ENCOUNTER — Ambulatory Visit (INDEPENDENT_AMBULATORY_CARE_PROVIDER_SITE_OTHER): Payer: 59 | Admitting: Podiatry

## 2023-06-26 VITALS — Ht 64.0 in | Wt 219.0 lb

## 2023-06-26 DIAGNOSIS — M79674 Pain in right toe(s): Secondary | ICD-10-CM

## 2023-06-26 DIAGNOSIS — E1140 Type 2 diabetes mellitus with diabetic neuropathy, unspecified: Secondary | ICD-10-CM | POA: Diagnosis not present

## 2023-06-26 DIAGNOSIS — B351 Tinea unguium: Secondary | ICD-10-CM | POA: Diagnosis not present

## 2023-06-26 DIAGNOSIS — E1149 Type 2 diabetes mellitus with other diabetic neurological complication: Secondary | ICD-10-CM | POA: Diagnosis not present

## 2023-06-26 DIAGNOSIS — M79675 Pain in left toe(s): Secondary | ICD-10-CM

## 2023-07-05 NOTE — Progress Notes (Signed)
  Subjective:  Patient ID: Erika [name], female    DOB: [birth year],  MRN: [id number]  58 y.o. female presents to clinic with  at risk foot care with history of diabetic neuropathy and painful elongated mycotic toenails 1-5 bilaterally which are tender when wearing enclosed shoe gear. Pain is relieved with periodic professional debridement.  Chief Complaint  Patient presents with   [facility]    She is here for foot care and nail trim, " I have a concern about the big toe on the left foot and it was discussed at last visit" and last A1C is 7.0."   New problem(s): None   PCP is [place], Timoteo Expose, MD.  Allergies  Allergen Reactions   Codeine Other (See Comments)    dizzy  Other reaction(s): too strong/makes dizzy  dizzy  dizzy    dizzy  Other reaction(s): too strong/makes dizzy   Latex Hives   Other Other (See Comments)    Allergy to msg   Reaction- headache Allergy to saccharin-  Reaction- headache Other reaction(s): rash   Benadryl [Diphenhydramine] Rash   Dust Mite Extract Itching   Nitrofurantoin Monohyd Macro Rash   Sulfa Antibiotics Rash    Review of Systems: Negative except as noted in the HPI.   Objective:  Erika [name] is a pleasant 58 y.o. female in NAD. AAO x 3.  Vascular Examination: Capillary refill time immediate b/l. Vascular status intact b/l with palpable pedal pulses. Pedal hair present b/l. No pain with calf compression b/l. Skin temperature gradient WNL b/l. No cyanosis or clubbing b/l. No ischemia or gangrene noted b/l.   Neurological Examination: Pt has subjective symptoms of neuropathy. Protective sensation diminished with 10g monofilament b/l.  Dermatological Examination: Pedal skin with normal turgor, texture and tone b/l.  No open wounds. No interdigital macerations.   Toenails 1-5 b/l thick, discolored, elongated with subungual debris and pain on dorsal palpation.   No hyperkeratotic nor porokeratotic lesions present on today's  visit.  Musculoskeletal Examination: Muscle strength 5/5 to all lower extremity muscle groups bilaterally. No pain, crepitus or joint limitation noted with ROM bilateral LE. No gross bony deformities bilaterally.  Radiographs: None  Assessment:   1. Pain due to onychomycosis of toenails of both feet   2. Diabetic neuropathy with neurologic complication [facility])    Plan:  -Consent given for treatment as described below: -Examined patient. -Patient to continue soft, supportive shoe gear daily. -Mycotic toenails 1-5 bilaterally were debrided in length and girth with sterile nail nippers and dremel without incident. Discussed Vick's Vapor Rub and she may apply once daily to toenails. -Patient/POA to call should there be question/concern in the interim.  Return in about 3 months (around 09/24/2023).  Freddie Breech, DPM      [place] LOCATION: [address] [place] [address]                   Office [phone]   [facility] LOCATION: [address] [place], [place] [address] Office [phone]

## 2023-09-26 ENCOUNTER — Other Ambulatory Visit: Payer: Self-pay | Admitting: Surgery

## 2023-09-26 DIAGNOSIS — E049 Nontoxic goiter, unspecified: Secondary | ICD-10-CM

## 2023-10-02 ENCOUNTER — Ambulatory Visit: Payer: BC Managed Care – PPO | Admitting: Allergy

## 2023-10-07 ENCOUNTER — Encounter: Payer: Self-pay | Admitting: Surgery

## 2023-10-14 ENCOUNTER — Other Ambulatory Visit: Payer: Self-pay

## 2023-10-16 ENCOUNTER — Ambulatory Visit (INDEPENDENT_AMBULATORY_CARE_PROVIDER_SITE_OTHER): Payer: 59 | Admitting: Podiatry

## 2023-10-16 ENCOUNTER — Encounter: Payer: Self-pay | Admitting: Podiatry

## 2023-10-16 VITALS — Ht 64.0 in | Wt 219.0 lb

## 2023-10-16 DIAGNOSIS — M79674 Pain in right toe(s): Secondary | ICD-10-CM

## 2023-10-16 DIAGNOSIS — M79675 Pain in left toe(s): Secondary | ICD-10-CM

## 2023-10-16 DIAGNOSIS — B351 Tinea unguium: Secondary | ICD-10-CM

## 2023-10-16 DIAGNOSIS — E1140 Type 2 diabetes mellitus with diabetic neuropathy, unspecified: Secondary | ICD-10-CM

## 2023-10-16 DIAGNOSIS — E1149 Type 2 diabetes mellitus with other diabetic neurological complication: Secondary | ICD-10-CM | POA: Diagnosis not present

## 2023-10-20 ENCOUNTER — Encounter: Payer: Self-pay | Admitting: Podiatry

## 2023-10-20 NOTE — Progress Notes (Signed)
  Subjective:  Patient ID: Erika [name], female    DOB: [birth year],  MRN: [id number]  58 y.o. female presents at risk foot care with history of diabetic neuropathy and painful, elongated thickened toenails x 10 which are symptomatic when wearing enclosed shoe gear. This interferes with his/her daily activities. Chief Complaint  Patient presents with   Nail Problem    Pt is here for [facility] last A1C was 7 PCP is Dr [place] and LOV was a few months ago.    New problem(s): None   PCP is [place], Theotis Flake, MD.  Allergies  Allergen Reactions   Codeine Other (See Comments)    dizzy  Other reaction(s): too strong/makes dizzy  dizzy  dizzy    dizzy  Other reaction(s): too strong/makes dizzy   Latex Hives   Other Other (See Comments)    Allergy  to msg   Reaction- headache Allergy  to saccharin-  Reaction- headache Other reaction(s): rash   Benadryl [Diphenhydramine] Rash   Dust Mite Extract Itching   Nitrofurantoin Monohyd Macro Rash   Sulfa Antibiotics Rash    Review of Systems: Negative except as noted in the HPI.   Objective:  Erika [name] is a pleasant 58 y.o. female in NAD. AAO x 3.  Vascular Examination: Vascular status intact b/l with palpable pedal pulses. CFT immediate b/l. Pedal hair present. No edema. No pain with calf compression b/l. Skin temperature gradient WNL b/l. No varicosities noted. No cyanosis or clubbing noted.  Neurological Examination: Pt has subjective symptoms of neuropathy. Protective sensation diminished with 10g monofilament b/l.  Dermatological Examination: Pedal skin with normal turgor, texture and tone b/l. No open wounds nor interdigital macerations noted. Toenails 1-5 b/l thick, discolored, elongated with subungual debris and pain on dorsal palpation. No hyperkeratotic lesions noted b/l.   Musculoskeletal Examination: Muscle strength 5/5 to b/l LE.  No pain, crepitus noted b/l. No gross pedal deformities. Patient ambulates independently without  assistive aids.   Radiographs: None  Last A1c:       No data to display           Assessment:   1. Pain due to onychomycosis of toenails of both feet   2. Diabetic neuropathy with neurologic complication [facility])     Plan:  Patient was evaluated and treated. All patient's and/or POA's questions/concerns addressed on today's visit. Toenails 1-5 debrided in length and girth without incident. Continue foot and shoe inspections daily. Monitor blood glucose per PCP/Endocrinologist's recommendations. Continue soft, supportive shoe gear daily. Report any pedal injuries to medical professional. Call office if there are any questions/concerns. -Patient/POA to call should there be question/concern in the interim.  Return in about 3 months (around 01/16/2024).  Luella Sager, DPM      [place] LOCATION: [address], [place] [address]                   Office [phone]   [facility] LOCATION: [address] [place], [place] [address] Office [phone]

## 2023-10-29 ENCOUNTER — Ambulatory Visit
Admission: RE | Admit: 2023-10-29 | Discharge: 2023-10-29 | Disposition: A | Discharge status: Home / Self Care | Payer: Self-pay | Source: Ambulatory Visit | Attending: Surgery | Admitting: Surgery

## 2023-10-29 DIAGNOSIS — E049 Nontoxic goiter, unspecified: Secondary | ICD-10-CM

## 2023-10-29 MED ORDER — IOPAMIDOL (ISOVUE-300) INJECTION 61%
75.0000 mL | Freq: Once | INTRAVENOUS | Status: AC | PRN
Start: 2023-10-29 — End: 2023-10-29
  Administered 2023-10-29: 75 mL via INTRAVENOUS

## 2023-10-30 ENCOUNTER — Other Ambulatory Visit: Payer: Self-pay

## 2023-10-30 MED ORDER — OLOPATADINE HCL 0.6 % NA SOLN: 2.0000 | Freq: Two times a day (BID) | NASAL | 5 refills | Status: AC

## 2024-01-22 ENCOUNTER — Encounter: Payer: Self-pay | Admitting: Podiatry

## 2024-01-22 ENCOUNTER — Ambulatory Visit (INDEPENDENT_AMBULATORY_CARE_PROVIDER_SITE_OTHER): Admitting: Podiatry

## 2024-01-22 DIAGNOSIS — E1149 Type 2 diabetes mellitus with other diabetic neurological complication: Secondary | ICD-10-CM

## 2024-01-22 DIAGNOSIS — E1140 Type 2 diabetes mellitus with diabetic neuropathy, unspecified: Secondary | ICD-10-CM

## 2024-01-22 DIAGNOSIS — M79675 Pain in left toe(s): Secondary | ICD-10-CM

## 2024-01-22 DIAGNOSIS — B351 Tinea unguium: Secondary | ICD-10-CM

## 2024-01-22 DIAGNOSIS — M79674 Pain in right toe(s): Secondary | ICD-10-CM

## 2024-01-28 ENCOUNTER — Encounter: Payer: Self-pay | Admitting: Podiatry

## 2024-01-28 NOTE — Progress Notes (Signed)
  Subjective:  Patient ID: Erika [name], female    DOB: 04-11-1966,  MRN: [id number]  Erika [name] presents to clinic today for at risk foot care with history of diabetic neuropathy and painful thick toenails that are difficult to trim. Pain interferes with ambulation. Aggravating factors include wearing enclosed shoe gear. Pain is relieved with periodic professional debridement.  Chief Complaint  Patient presents with   Diabetes    DFC IDDM A1C 7. Toenail trim. Upcomming appointment with PCP Tuesday.   New problem(s): None.   PCP is [place], Betty G, MD.  Allergies  Allergen Reactions   Codeine Other (See Comments)    dizzy  Other reaction(s): too strong/makes dizzy  dizzy  dizzy    dizzy  Other reaction(s): too strong/makes dizzy   Latex Hives   Other Other (See Comments)    Allergy  to msg   Reaction- headache Allergy  to saccharin-  Reaction- headache Other reaction(s): rash   Benadryl [Diphenhydramine] Rash   Dust Mite Extract Itching   Nitrofurantoin Monohyd Macro Rash   Sulfa Antibiotics Rash    Review of Systems: Negative except as noted in the HPI.  Objective: No changes noted in today's physical examination. There were no vitals filed for this visit. Erika [name] is a pleasant 58 y.o. female in NAD. AAO x 3.  Vascular Examination: Capillary refill time immediate b/l. Vascular status intact b/l with palpable pedal pulses. Pedal hair present b/l. No pain with calf compression b/l. Skin temperature gradient WNL b/l. No cyanosis or clubbing b/l. No ischemia or gangrene noted b/l. No edema noted b/l LE.  Neurological Examination: Pt has subjective symptoms of neuropathy. Protective sensation diminished with 10g monofilament b/l.  Dermatological Examination: Pedal skin with normal turgor, texture and tone b/l.  No open wounds. No interdigital macerations.   Toenails 1-5 b/l thick, discolored, elongated with subungual debris and pain on dorsal palpation.   No  corns, calluses, nor porokeratotic lesions.  Musculoskeletal Examination: Muscle strength 5/5 to all lower extremity muscle groups bilaterally. No pain, crepitus or joint limitation noted with ROM b/l LE. No gross bony pedal deformities b/l. Patient ambulates independently without assistive aids.  Radiographs: None  Assessment/Plan: 1. Pain due to onychomycosis of toenails of both feet   2. Diabetic neuropathy with neurologic complication [facility])   Consent given for treatment. Patient examined. All patient's and/or POA's questions/concerns addressed on today's visit. Mycotic toenails 1-5 debrided in length and girth without incident. Continue foot and shoe inspections daily. Monitor blood glucose per PCP/Endocrinologist's recommendations.Continue soft, supportive shoe gear daily. Report any pedal injuries to medical professional. Call office if there are any quesitons/concerns. -Patient/POA to call should there be question/concern in the interim.   No follow-ups on file.  Erika LITTIE [name], DPM      [place] LOCATION: [address], [place] [address]                   Office [phone]   [facility] LOCATION: [address] [place], [place] [address] Office [phone]

## 2024-03-17 ENCOUNTER — Other Ambulatory Visit: Payer: Self-pay | Admitting: Internal Medicine

## 2024-03-17 DIAGNOSIS — E041 Nontoxic single thyroid nodule: Secondary | ICD-10-CM

## 2024-03-30 ENCOUNTER — Ambulatory Visit
Admission: RE | Admit: 2024-03-30 | Discharge: 2024-03-30 | Disposition: A | Discharge status: Home / Self Care | Source: Ambulatory Visit | Attending: Internal Medicine | Admitting: Internal Medicine

## 2024-03-30 DIAGNOSIS — E041 Nontoxic single thyroid nodule: Secondary | ICD-10-CM

## 2024-03-30 HISTORY — PX: IR RADIOLOGIST EVAL & MGMT: IMG5224

## 2024-03-30 NOTE — Progress Notes (Signed)
 Chief Complaint: Patient was seen in consultation today for enlarged thyroid  gland  Referring Physician(s): Kerr,Jeffrey  History of Present Illness: Erika [name] is a 58 y.o. female with a medical history significant for DM2, HTN, obesity and multinodular goiter. She has a significantly enlarged thyroid  which has been present on imaging since at least 2013. She is familiar to IR from thyroid  nodule biopsies (left nodule; isthmus nodule) in 2013 which were benign. Her thyroid  has gradually increased in size over the years and she is experiencing worsening compressive symptoms. She is currently on low dose levothyroxine at 25 mcg daily.   She was referred for surgical evaluation and first met with Dr. Eletha 09/26/23. He explained that she would not be a good candidate for hormone suppression, alcohol instillation, radiofrequency ablation or radioactive iodine treatment. He recommended a total thyroidectomy. At that visit the patient expressed a desire to avoid surgery and Dr. Eletha ordered a CT of the neck for further work up. This was completed 10/29/23 and showed a markedly enlarged thyroid  gland, particularly the isthmus and left lobe with mass effect on the trachea.   She followed up with Dr. Eletha again 12/03/23 and he reiterated his same recommendation for total thyroidectomy. The patient is hopeful to avoid surgery and she requested Dr. Faythe to help her find a provider that offers non-surgical options. The patient has kindly been referred to Interventional Radiology to discuss radiofrequency ablation as a possible treatment option.    Past Medical History:  Diagnosis Date   Arthritis    hands, neck   Asthma    no inhaler, no problems x 5 years.   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    Cervical dysplasia    Depression    no med   Diabetes mellitus without complication (HCC)    Type 2   Ependymoma of spinal cord (HCC) 1999   Radiation and surgery    Fibroid    Goiter    Heart murmur    never had any problems   Hyperlipidemia    Hypertension    Hypothyroid    Infertility, female    Neuromuscular disorder (HCC)    neuropathy in feet and finger tips   Neuropathy    located fingers and toes   Ocular hypertension    PCOS (polycystic ovarian syndrome)    Seasonal allergies    Umbilical hernia    Urinary incontinence     Past Surgical History:  Procedure Laterality Date   BACK SURGERY  12/1997,01/12/1998,01/20/1998   x 3 surgeries - L4/L5/L6   BREAST BIOPSY Right 12/14/2004   COLPOSCOPY     DILATATION & CURETTAGE/HYSTEROSCOPY WITH MYOSURE N/A 12/14/2014   Procedure: DILATATION & CURETTAGE/HYSTEROSCOPY WITH MYOSURE;  Surgeon: Evalene SHAUNNA Organ, MD;  Location: [facility];  Service: Gynecology;  Laterality: N/A;  to follow 2nd case 10:00?  needs one hour OR time   DILATION AND CURETTAGE OF UTERUS  2010   endometrial polyp   HYSTEROSCOPY  2004   MYOMECTOMY  2004   OVARY SURGERY  09/1998   bilateral ovarian transposition due to radiation treatment for spine lesion   WISDOM TOOTH EXTRACTION      Allergies: Codeine, Latex, Other, Benadryl [diphenhydramine], Dust mite extract, Nitrofurantoin monohyd macro, and Sulfa antibiotics  Medications: Prior to Admission medications   Medication Sig Start Date End Date Taking? Authorizing Provider  acetaminophen  (TYLENOL ) 325 MG tablet 1 tablet as needed    [provider]  albuterol  (VENTOLIN  HFA)  108 (90 Base) MCG/ACT inhaler Inhale 2 puffs into the lungs every 6 (six) hours as needed for wheezing or shortness of breath. 07/07/19   [place], Betty G, MD  atorvastatin (LIPITOR) 10 MG tablet Take 1 tablet by mouth at bedtime.    [provider]  BD PEN NEEDLE NANO 2ND GEN 32G X 4 MM MISC USE TO ADMINSTER INSULIN ONCE A DAY SUBCUTANEOUS AS DIRECTED 05/28/19   [provider]  brimonidine  (ALPHAGAN ) 0.2 % ophthalmic solution INSTILL 1 DROP IN BOTH EYES FOUR TIMES A DAY 08/03/20    [provider]  cholecalciferol (VITAMIN D) 25 MCG (1000 UNIT) tablet 1 tablet    [provider]  Dapagliflozin-metFORMIN  HCl ER (XIGDUO XR) 10-998 MG TB24 2 tablets 10/26/19   [provider]  fexofenadine  (ALLEGRA ) 180 MG tablet Take 1 tablet (180 mg total) by mouth daily. 04/11/23   Jeneal Danita Macintosh, MD  fluticasone  [facility]) 50 MCG/ACT nasal spray Place into the nose. Patient not taking: Reported on 01/22/2024 04/02/20 04/02/21  [provider]  glucose blood test strip For use when checking blood glucose 12/10/16   [provider]  ibuprofen (ADVIL) 200 MG tablet 1 tablet with food or milk as needed    [provider]  Insulin Glargine (BASAGLAR KWIKPEN) 100 UNIT/ML SOPN Inject 10 Units into the skin daily. INJECT 110 UNITS (TITRATE AS DIRECTED, MAX DAILY DOSE OF 100 UNITS) ONCE A DAY 04/28/19   [provider]  latanoprost (XALATAN) 0.005 % ophthalmic solution Place 1 drop into both eyes at bedtime.    [provider]  levothyroxine (SYNTHROID, LEVOTHROID) 25 MCG tablet TAKE 1 TABLET BY MOUTH EVERY DAY 11/18/17   Fontaine, Evalene SQUIBB, MD  mometasone  (NASONEX ) 50 MCG/ACT nasal spray 2 sprays each nostril daily for 1-2 weeks at a time before stopping once nasal congestion improves. Patient not taking: Reported on 01/22/2024 04/11/23   Jeneal Danita Macintosh, MD  Olopatadine  HCl 0.6 % SOLN Place 2 sprays into both nostrils 2 (two) times daily. 10/30/23   Jeneal Danita Macintosh, MD  OneTouch Delica Lancets 30G MISC For use when checking blood glucose    [provider]  Spacer/Aero-Holding Chambers (AEROCHAMBER PLUS) inhaler use with inhalor    [provider]  triamterene-hydrochlorothiazide (MAXZIDE-25) 37.5-25 MG tablet Take 1 tablet by mouth every morning. 05/22/22   [provider]  TRULICITY 4.5 MG/0.5ML SOAJ SMARTSIG:4.5 Milligram(s) SUB-Q Once a Week    [provider]   VASCEPA 1 g capsule Take 1 g by mouth 2 (two) times daily. 11/23/19   [provider]     Family History  Problem Relation Age of Onset   Lung cancer Mother    Hypertension Maternal Aunt    Breast cancer Maternal Aunt        Age 56   Thyroid  disease Maternal Aunt    Colon cancer Maternal Aunt    Lung cancer Maternal Uncle    Esophageal cancer Maternal Uncle    Cirrhosis Maternal Aunt    Colon cancer Maternal Aunt    Throat cancer Brother     Social History   Socioeconomic History   Marital status: Married    Spouse name: Not on file   Number of children: Not on file   Years of education: Not on file   Highest education level: Not on file  Occupational History   Not on file  Tobacco Use   Smoking status: Former    Current packs/day: 0.00  Average packs/day: 0.3 packs/day for 9.0 years (2.3 ttl pk-yrs)    Types: Cigarettes    Start date: 08/22/1976    Quit date: 08/22/1985    Years since quitting: 38.6    Passive exposure: Past   Smokeless tobacco: Never  Vaping Use   Vaping status: Never Used  Substance and Sexual Activity   Alcohol use: Not Currently    Comment: rare wine   Drug use: No   Sexual activity: Not Currently    Birth control/protection: Post-menopausal    Comment: 1st intercourse 6 yo-5 partners  Other Topics Concern   Not on file  Social History Narrative   Not on file   Social Drivers of Health   Financial Resource Strain: Not on file  Food Insecurity: Not on file  Transportation Needs: Not on file  Physical Activity: Not on file  Stress: Not on file  Social Connections: Not on file     Review of Systems: A 12 point ROS discussed and pertinent positives are indicated in the HPI above.  All other systems are negative.  Vital Signs: LMP 08/20/2014   Physical Exam Constitutional:      General: She is not in acute distress. HENT:     Head: Normocephalic.     Mouth/Throat:     Mouth: Mucous membranes are moist.  Eyes:      General: No scleral icterus. Neck:     Comments: Mild diffuse prominence of the thyroid . Cardiovascular:     Rate and Rhythm: Normal rate and regular rhythm.  Pulmonary:     Effort: No respiratory distress.  Abdominal:     General: There is no distension.  Musculoskeletal:     Right lower leg: No edema.     Left lower leg: No edema.  Skin:    General: Skin is warm and dry.  Neurological:     Mental Status: She is alert and oriented to person, place, and time.     Imaging:  CT soft tissue neck 12/01/23       Thyroid : Markedly enlarged and heterogeneous thyroid  gland (5.6 cm AP x 9.4 cm TV x 10.8 cm CC ), particularly the isthmus and left lobe. This causes mass effect on the trachea with rightward deviation but the trachea remains widely patent.   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   No individual thyroid  nodule identified which would warrant biopsy or imaging surveillance.  TFTs  Serum TSH 1.2  06/28/23  Serum free T4 n/a Serum T3 n/a Thyroperoxidase Antibody n/a Thyroglobulin Antibody n/a Calcitonin n/a  Prior Thyroid  FNA: 04/24/2012 - Left thyroid  and isthmic nodules - both benign   Assessment and Plan: 58 year old female with a history of a massive multinodular goiter with worsening compressive symptoms.  She is adamant about not pursuing thyroidectomy.  She is very interested in pursuing radiofrequency ablation.  We had a frank discussion that surgery is the only procedure that can rid her of all of her symptoms, and that RFA may help decrease the size of her nodular thyroid , but this may require many sessions.  The most symptomatic aspect of her thyroid  is just to the right of the midline of her neck, which corresponds to the isthmus nodule which was biopsied in 2013 by Dr. Luverne. We discussed starting with this nodule, and if it responds very well, we can then  seek out the nodular aspect of her left hemithyroid.  We discussed risks and benefits of radiofrequency thyroid  ablation to most commonly include discomfort/pain (2.6%), followed by less commonly (all 1.0% or less) voice change, nodule rupture, infection, hypothyroidism, brachial plexus injury, hematoma, vomiting, and skin burn.   She is amenable and wishes to proceed.  -obtain thyroid  nodule FNA of the isthmus nodule (recommend to schedule with me) -once negative cytology result, we will then proceed with scheduling RFA (10 mm electrode)  Ester Sides, MD Pager: [phone]    I spent a total of  40 Minutes   in face to face in clinical consultation, greater than 50% of which was counseling/coordinating care for enlarged thyroid  gland.

## 2024-03-31 ENCOUNTER — Other Ambulatory Visit: Payer: Self-pay | Admitting: Interventional Radiology

## 2024-03-31 DIAGNOSIS — E041 Nontoxic single thyroid nodule: Secondary | ICD-10-CM

## 2024-04-10 ENCOUNTER — Ambulatory Visit
Admission: RE | Admit: 2024-04-10 | Discharge: 2024-04-10 | Disposition: A | Discharge status: Home / Self Care | Source: Ambulatory Visit | Attending: Interventional Radiology | Admitting: Interventional Radiology

## 2024-04-10 ENCOUNTER — Other Ambulatory Visit (HOSPITAL_COMMUNITY)
Admission: RE | Admit: 2024-04-10 | Discharge: 2024-04-10 | Disposition: A | Source: Ambulatory Visit | Attending: Interventional Radiology | Admitting: Interventional Radiology

## 2024-04-10 DIAGNOSIS — E041 Nontoxic single thyroid nodule: Secondary | ICD-10-CM | POA: Diagnosis present

## 2024-04-14 ENCOUNTER — Other Ambulatory Visit: Payer: Self-pay | Admitting: Interventional Radiology

## 2024-04-14 DIAGNOSIS — E041 Nontoxic single thyroid nodule: Secondary | ICD-10-CM

## 2024-04-14 LAB — CYTOLOGY - NON PAP

## 2024-04-24 ENCOUNTER — Inpatient Hospital Stay: Admission: RE | Admit: 2024-04-24 | Source: Ambulatory Visit

## 2024-05-13 ENCOUNTER — Encounter: Payer: Self-pay | Admitting: Podiatry

## 2024-05-13 ENCOUNTER — Ambulatory Visit: Admitting: Podiatry

## 2024-05-13 DIAGNOSIS — M79675 Pain in left toe(s): Secondary | ICD-10-CM

## 2024-05-13 DIAGNOSIS — E1149 Type 2 diabetes mellitus with other diabetic neurological complication: Secondary | ICD-10-CM

## 2024-05-13 DIAGNOSIS — E119 Type 2 diabetes mellitus without complications: Secondary | ICD-10-CM

## 2024-05-13 DIAGNOSIS — B351 Tinea unguium: Secondary | ICD-10-CM | POA: Diagnosis not present

## 2024-05-13 DIAGNOSIS — E1140 Type 2 diabetes mellitus with diabetic neuropathy, unspecified: Secondary | ICD-10-CM | POA: Diagnosis not present

## 2024-05-13 DIAGNOSIS — Z0189 Encounter for other specified special examinations: Secondary | ICD-10-CM

## 2024-05-13 DIAGNOSIS — M79674 Pain in right toe(s): Secondary | ICD-10-CM | POA: Diagnosis not present

## 2024-05-17 NOTE — Progress Notes (Signed)
  Subjective:  Patient ID: Erika [name], female    DOB: 08-15-1965,  MRN: [id number]  Erika [name] presents to clinic today for for annual diabetic foot examination, at risk foot care with history of diabetic neuropathy, and painful mycotic toenails of both feet that are difficult to trim. Pain interferes with daily activities and wearing enclosed shoe gear comfortably.  Chief Complaint  Patient presents with   [facility]    Rm16 Diabetic foot care/ A1c 10.2/ Dr. Betty Jordan   New problem(s): None.   PCP is Jordan, Betty G, MD.  Allergies  Allergen Reactions   Codeine Other (See Comments)    dizzy  Other reaction(s): too strong/makes dizzy  dizzy  dizzy    dizzy  Other reaction(s): too strong/makes dizzy   Latex Hives   Other Other (See Comments)    Allergy  to msg   Reaction- headache Allergy  to saccharin-  Reaction- headache Other reaction(s): rash   Benadryl [Diphenhydramine] Rash   Dust Mite Extract Itching   Nitrofurantoin Monohyd Macro Rash   Sulfa Antibiotics Rash    Review of Systems: Negative except as noted in the HPI.  Objective: No changes noted in today's physical examination. There were no vitals filed for this visit. Erika [name] is a pleasant 58 y.o. female in NAD. AAO x 3.  Vascular Examination: Capillary refill time immediate b/l. Vascular status intact b/l with palpable pedal pulses. Pedal hair present b/l. No pain with calf compression b/l. Skin temperature gradient WNL b/l. No cyanosis or clubbing b/l. No ischemia or gangrene noted b/l. No edema noted b/l LE.  Neurological Examination: Pt has subjective symptoms of neuropathy. Protective sensation diminished with 10g monofilament b/l.  Dermatological Examination: Pedal skin with normal turgor, texture and tone b/l.  No open wounds. No interdigital macerations.   Toenails 1-5 b/l thick, discolored, elongated with subungual debris and pain on dorsal palpation.   No corns, calluses, nor porokeratotic  lesions.  Musculoskeletal Examination: Muscle strength 5/5 to all lower extremity muscle groups bilaterally. No pain, crepitus or joint limitation noted with ROM b/l LE. No gross bony pedal deformities b/l. Patient ambulates independently without assistive aids.  Assessment/Plan: 1. Pain due to onychomycosis of toenails of both feet   2. Diabetic neuropathy with neurologic complication (HCC)   3. Encounter for diabetic foot exam (HCC)   Diabetic foot examination performed today. All patient's and/or POA's questions/concerns addressed on today's visit. Toenails 1-5 b/l debrided in length and girth without incident. Continue foot and shoe inspections daily. Monitor blood glucose per PCP/Endocrinologist's recommendations. Continue soft, supportive shoe gear daily. Report any pedal injuries to medical professional. Call office if there are any questions/concerns. -Patient/POA to call should there be question/concern in the interim.   Return in about 3 months (around 08/11/2024).  Delon LITTIE Merlin, DPM      [place] LOCATION: [address], [place] [address]                   Office [phone]   [facility] LOCATION: [address] [place], [place] [address] Office [phone]

## 2024-05-22 ENCOUNTER — Ambulatory Visit
Admission: RE | Admit: 2024-05-22 | Discharge: 2024-05-22 | Disposition: A | Source: Ambulatory Visit | Attending: Interventional Radiology

## 2024-05-22 DIAGNOSIS — E041 Nontoxic single thyroid nodule: Secondary | ICD-10-CM

## 2024-08-26 ENCOUNTER — Ambulatory Visit: Admitting: Podiatry
